# Patient Record
Sex: Male | Born: 1960
Health system: Southern US, Community
[De-identification: ages and names within clinical notes are randomized; demographics above are authoritative.]

## PROBLEM LIST (undated history)

## (undated) DIAGNOSIS — G8929 Other chronic pain: Secondary | ICD-10-CM

## (undated) DIAGNOSIS — I2510 Atherosclerotic heart disease of native coronary artery without angina pectoris: Secondary | ICD-10-CM

## (undated) DIAGNOSIS — R7303 Prediabetes: Secondary | ICD-10-CM

## (undated) DIAGNOSIS — I10 Essential (primary) hypertension: Secondary | ICD-10-CM

## (undated) DIAGNOSIS — I38 Endocarditis, valve unspecified: Secondary | ICD-10-CM

## (undated) DIAGNOSIS — R519 Headache, unspecified: Secondary | ICD-10-CM

## (undated) DIAGNOSIS — I509 Heart failure, unspecified: Secondary | ICD-10-CM

## (undated) DIAGNOSIS — M1990 Unspecified osteoarthritis, unspecified site: Secondary | ICD-10-CM

## (undated) HISTORY — DX: Other chronic pain: G89.29

---

## 1989-02-10 DIAGNOSIS — A159 Respiratory tuberculosis unspecified: Secondary | ICD-10-CM

## 1989-02-10 HISTORY — DX: Respiratory tuberculosis unspecified: A15.9

## 2017-09-07 ENCOUNTER — Other Ambulatory Visit: Payer: Self-pay

## 2017-09-07 ENCOUNTER — Emergency Department (HOSPITAL_COMMUNITY): Payer: No Typology Code available for payment source

## 2017-09-07 ENCOUNTER — Encounter (HOSPITAL_COMMUNITY): Payer: Self-pay

## 2017-09-07 ENCOUNTER — Emergency Department (HOSPITAL_COMMUNITY)
Admission: EM | Admit: 2017-09-07 | Discharge: 2017-09-07 | Disposition: A | Discharge status: Home / Self Care | Payer: No Typology Code available for payment source | Attending: Emergency Medicine | Admitting: Emergency Medicine

## 2017-09-07 DIAGNOSIS — S46911A Strain of unspecified muscle, fascia and tendon at shoulder and upper arm level, right arm, initial encounter: Secondary | ICD-10-CM | POA: Insufficient documentation

## 2017-09-07 DIAGNOSIS — Y999 Unspecified external cause status: Secondary | ICD-10-CM | POA: Insufficient documentation

## 2017-09-07 DIAGNOSIS — Y92410 Unspecified street and highway as the place of occurrence of the external cause: Secondary | ICD-10-CM | POA: Diagnosis not present

## 2017-09-07 DIAGNOSIS — R51 Headache: Secondary | ICD-10-CM | POA: Insufficient documentation

## 2017-09-07 DIAGNOSIS — Z79899 Other long term (current) drug therapy: Secondary | ICD-10-CM | POA: Insufficient documentation

## 2017-09-07 DIAGNOSIS — M5030 Other cervical disc degeneration, unspecified cervical region: Secondary | ICD-10-CM

## 2017-09-07 DIAGNOSIS — M5020 Other cervical disc displacement, unspecified cervical region: Secondary | ICD-10-CM | POA: Diagnosis not present

## 2017-09-07 DIAGNOSIS — Y9389 Activity, other specified: Secondary | ICD-10-CM | POA: Diagnosis not present

## 2017-09-07 DIAGNOSIS — S4991XA Unspecified injury of right shoulder and upper arm, initial encounter: Secondary | ICD-10-CM | POA: Diagnosis present

## 2017-09-07 DIAGNOSIS — I10 Essential (primary) hypertension: Secondary | ICD-10-CM | POA: Diagnosis not present

## 2017-09-07 DIAGNOSIS — F17210 Nicotine dependence, cigarettes, uncomplicated: Secondary | ICD-10-CM | POA: Insufficient documentation

## 2017-09-07 HISTORY — DX: Essential (primary) hypertension: I10

## 2017-09-07 LAB — I-STAT CHEM 8, ED
BUN: 11 mg/dL (ref 6–20)
CREATININE: 1 mg/dL (ref 0.61–1.24)
Calcium, Ion: 1.22 mmol/L (ref 1.15–1.40)
Chloride: 107 mmol/L (ref 98–111)
Glucose, Bld: 97 mg/dL (ref 70–99)
HEMATOCRIT: 36 % — AB (ref 39.0–52.0)
HEMOGLOBIN: 12.2 g/dL — AB (ref 13.0–17.0)
POTASSIUM: 4.3 mmol/L (ref 3.5–5.1)
SODIUM: 141 mmol/L (ref 135–145)
TCO2: 25 mmol/L (ref 22–32)

## 2017-09-07 MED ORDER — AMLODIPINE BESYLATE 10 MG PO TABS: 10.0000 mg | ORAL_TABLET | Freq: Every day | ORAL | 0 refills | Status: DC

## 2017-09-07 MED ORDER — HYDROCODONE-ACETAMINOPHEN 5-325 MG PO TABS
2.0000 | ORAL_TABLET | Freq: Once | ORAL | Status: AC
  Administered 2017-09-07: 2 via ORAL
  Filled 2017-09-07: qty 2

## 2017-09-07 MED ORDER — MORPHINE SULFATE (PF) 4 MG/ML IV SOLN
6.0000 mg | Freq: Once | INTRAVENOUS | Status: AC
  Administered 2017-09-07: 6 mg via INTRAVENOUS
  Filled 2017-09-07: qty 2

## 2017-09-07 MED ORDER — CLONIDINE HCL 0.1 MG PO TABS: 0.1000 mg | ORAL_TABLET | Freq: Two times a day (BID) | ORAL | 11 refills | Status: DC

## 2017-09-07 MED ORDER — HYDROCODONE-ACETAMINOPHEN 5-325 MG PO TABS: 2.0000 | ORAL_TABLET | ORAL | 0 refills | Status: DC | PRN

## 2017-09-07 MED ORDER — CLONIDINE HCL 0.2 MG PO TABS
0.2000 mg | ORAL_TABLET | Freq: Once | ORAL | Status: AC
  Administered 2017-09-07: 0.2 mg via ORAL
  Filled 2017-09-07: qty 1

## 2017-09-07 NOTE — Discharge Instructions (Signed)
It is very important for you to get your blood pressure well managed by a local doctor.  Start taking medications prescribed.  If you develop strokelike symptoms, weakness, numbness, loss of vision you need to be seen in the emergency room immediately.  For severe pain take norco or vicodin however realize they have the potential for addiction and it can make you sleepy and has tylenol in it.  No operating machinery while taking.

## 2017-09-07 NOTE — ED Provider Notes (Signed)
Patient was seen initially by Dr. Jodi Mourning.  Please see his notes.  Labs reviewed.     Labs Reviewed  I-STAT CHEM 8, ED - Abnormal; Notable for the following components:      Result Value   Hemoglobin 12.2 (*)    HCT 36.0 (*)    All other components within normal limits   No significant abnormalities.  BP is decreasing.  162/130.  Pt has poorly controlled htn.  Will dc home on bp meds as planned.  Discussed importance of PCP follow up and BP treatment with the pt   Linwood Dibbles, MD 09/07/17 1732
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
## 2017-09-08 ENCOUNTER — Telehealth: Payer: Self-pay | Admitting: Orthopedic Surgery

## 2017-09-08 NOTE — Telephone Encounter (Signed)
Patient called for an appointment for a "bulging disc".  States he was seen in the ED yesterday.  I told him that Dr. Romeo Apple was not the physician on call even though that was stated by the ED and also Dr Romeo Apple does not handle bulging disc problems.  I asked him if he had scheduled an appointment to see his PCP as indicated by the ED.  He said he has an appointment for tomorrow. I told him to then ask for referral to [facility] for the disc problem.  He said he would do this.

## 2017-10-19 ENCOUNTER — Telehealth: Payer: Self-pay

## 2017-10-19 NOTE — Telephone Encounter (Signed)
Spoke with patient about scheduling in concussion clinic. He would be self-pay and is going to look into if he can get the money for a visit before scheduling. Will call back to schedule.

## 2017-11-25 ENCOUNTER — Telehealth: Payer: Self-pay

## 2017-11-25 NOTE — Telephone Encounter (Signed)
Left message for patient to call back to discuss referral to neurology for ongoing symptoms.

## 2017-11-27 ENCOUNTER — Telehealth: Payer: Self-pay
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
## 2017-12-11 ENCOUNTER — Ambulatory Visit: Payer: Self-pay | Admitting: *Deleted
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
## 2017-12-14 ENCOUNTER — Telehealth: Payer: Self-pay
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
## 2018-10-07 ENCOUNTER — Telehealth: Payer: Self-pay

## 2018-10-07 NOTE — Telephone Encounter (Signed)
Faxed over [employer] application and financial docs.  Devon Silva

## 2018-10-12 ENCOUNTER — Ambulatory Visit: Payer: Self-pay | Admitting: Physician Assistant

## 2018-10-12 ENCOUNTER — Encounter: Payer: Self-pay | Admitting: Physician Assistant

## 2018-10-12 ENCOUNTER — Other Ambulatory Visit: Payer: Self-pay

## 2018-10-12 VITALS — BP 153/118 | HR 58 | Temp 97.9°F | Ht 70.0 in | Wt 222.0 lb

## 2018-10-12 DIAGNOSIS — I10 Essential (primary) hypertension: Secondary | ICD-10-CM

## 2018-10-12 DIAGNOSIS — F17200 Nicotine dependence, unspecified, uncomplicated: Secondary | ICD-10-CM

## 2018-10-12 DIAGNOSIS — Z131 Encounter for screening for diabetes mellitus: Secondary | ICD-10-CM

## 2018-10-12 DIAGNOSIS — Z125 Encounter for screening for malignant neoplasm of prostate: Secondary | ICD-10-CM

## 2018-10-12 DIAGNOSIS — Z13220 Encounter for screening for lipoid disorders: Secondary | ICD-10-CM

## 2018-10-12 DIAGNOSIS — Z1211 Encounter for screening for malignant neoplasm of colon: Secondary | ICD-10-CM

## 2018-10-12 DIAGNOSIS — F1290 Cannabis use, unspecified, uncomplicated: Secondary | ICD-10-CM

## 2018-10-12 DIAGNOSIS — Z7689 Persons encountering health services in other specified circumstances: Secondary | ICD-10-CM

## 2018-10-12 DIAGNOSIS — D649 Anemia, unspecified: Secondary | ICD-10-CM

## 2018-10-12 MED ORDER — AMLODIPINE BESYLATE 10 MG PO TABS: 10.0000 mg | ORAL_TABLET | Freq: Every day | ORAL | 1 refills | Status: DC

## 2018-10-12 NOTE — Progress Notes (Signed)
BP (!) 153/118   Pulse (!) 58   Temp 97.9 F (36.6 C)   Ht 5\' 10"  (1.778 m)   Wt 222 lb (100.7 kg)   SpO2 95%   BMI 31.85 kg/m    Subjective:    Patient ID: Devon Silva, male    DOB: 1960/05/11, 58 y.o.   MRN: [id number]  HPI: Devon Silva is a 58 y.o. male presenting on 10/12/2018 for No chief complaint on file.   HPI Pt had a negative covid 19 screening questionnaire    Pt appt today to establish care as new patient  Pt get pain meds from dr Layla Barter in [place]  He doesn't know the name of the medication  He says he was given lasix so he could urinate   Pt does not work.    He says he has no history of heart problems.  No history of ankle swelling. 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  He says his last Echo about 2 years ago.  He says he is not having any CP or SOB.    Relevant past medical, surgical, family and social history reviewed and updated as indicated. Interim medical history since our last visit reviewed. Allergies and medications reviewed and updated.  CURRENT MEDS:  Hydralazine 25mg  bid lasix 40mg  qd Unknown pain medication  Review of Systems  Per HPI unless specifically indicated above     Objective:    BP (!) 153/118   Pulse (!) 58   Temp 97.9 F (36.6 C)   Ht 5\' 10"  (1.778 m)   Wt 222 lb (100.7 kg)   SpO2 95%   BMI 31.85 kg/m   Wt Readings from Last 3 Encounters:  10/12/18 222 lb (100.7 kg)  09/07/17 197 lb (89.4 kg)    Physical Exam Vitals signs reviewed.  Constitutional:      General: He is not in acute distress.    Appearance: He is well-developed. He is obese. He is not ill-appearing.  HENT:     Head: Normocephalic and atraumatic.     Mouth/Throat:     Pharynx: No oropharyngeal exudate.  Eyes:     Conjunctiva/sclera: Conjunctivae normal.     Pupils: Pupils are equal, round, and reactive to light.  Neck:     Musculoskeletal: Neck supple.     Thyroid: No thyromegaly.  Cardiovascular:     Rate and  Rhythm: Normal rate and regular rhythm.     Comments: Some irregular beats heard Pulmonary:     Effort: Pulmonary effort is normal.     Breath sounds: Normal breath sounds. No wheezing or rales.  Abdominal:     General: Bowel sounds are normal.     Palpations: Abdomen is soft. There is no mass.     Tenderness: There is no abdominal tenderness.  Musculoskeletal:     Right lower leg: No edema.     Left lower leg: No edema.  Lymphadenopathy:     Cervical: No cervical adenopathy.  Skin:    General: Skin is warm and dry.     Findings: No rash.  Neurological:     Mental Status: He is alert and oriented to person, place, and time.  Psychiatric:        Behavior: Behavior normal.        Thought Content: Thought content normal.    EKG- NSR at 92bpm.  Premature beats prsent.  No acute st-t changes seen.  No previous for comparison  Assessment & Plan:    Encounter Diagnoses  Name Primary?  . Encounter to establish care Yes  . Essential hypertension   . Screening cholesterol level   . Screening for diabetes mellitus   . Screening for prostate cancer   . Screening for colon cancer   . Anemia, unspecified type   . Tobacco use disorder   . Marijuana use      -will discontinue current diuretics.   rx amlodipine for uncontrolled HTN.  -pt given ifobt for colon cancer screening  -Pt to check bp a t home.  He is to notify office if bp is higher at home  -pt to Get baseline Labs  -pt to follow up 1 month.  He is to contact office sooner prn

## 2018-10-13 ENCOUNTER — Other Ambulatory Visit (HOSPITAL_COMMUNITY)
Admission: RE | Admit: 2018-10-13 | Discharge: 2018-10-13 | Disposition: A | Discharge status: Home / Self Care | Payer: Self-pay | Source: Ambulatory Visit | Attending: Physician Assistant | Admitting: Physician Assistant

## 2018-10-13 DIAGNOSIS — I10 Essential (primary) hypertension: Secondary | ICD-10-CM | POA: Insufficient documentation

## 2018-10-13 DIAGNOSIS — D649 Anemia, unspecified: Secondary | ICD-10-CM | POA: Insufficient documentation

## 2018-10-13 DIAGNOSIS — Z13220 Encounter for screening for lipoid disorders: Secondary | ICD-10-CM | POA: Insufficient documentation

## 2018-10-13 DIAGNOSIS — Z125 Encounter for screening for malignant neoplasm of prostate: Secondary | ICD-10-CM | POA: Insufficient documentation

## 2018-10-13 DIAGNOSIS — Z131 Encounter for screening for diabetes mellitus: Secondary | ICD-10-CM | POA: Insufficient documentation

## 2018-10-13 LAB — COMPREHENSIVE METABOLIC PANEL
ALT: 25 U/L (ref 0–44)
AST: 23 U/L (ref 15–41)
Albumin: 3.9 g/dL (ref 3.5–5.0)
Alkaline Phosphatase: 77 U/L (ref 38–126)
Anion gap: 6 (ref 5–15)
BUN: 20 mg/dL (ref 6–20)
CO2: 24 mmol/L (ref 22–32)
Calcium: 9.2 mg/dL (ref 8.9–10.3)
Chloride: 108 mmol/L (ref 98–111)
Creatinine, Ser: 1.25 mg/dL — ABNORMAL HIGH (ref 0.61–1.24)
GFR calc Af Amer: >60 mL/min (ref >60)
GFR calc non Af Amer: >60 mL/min (ref >60)
Glucose, Bld: 163 mg/dL — ABNORMAL HIGH (ref 70–99)
Potassium: 4.4 mmol/L (ref 3.5–5.1)
Sodium: 138 mmol/L (ref 135–145)
Total Bilirubin: 1.3 mg/dL — ABNORMAL HIGH (ref 0.3–1.2)
Total Protein: 7.7 g/dL (ref 6.5–8.1)

## 2018-10-13 LAB — CBC
HCT: 41 % (ref 39.0–52.0)
Hemoglobin: 13.7 g/dL (ref 13.0–17.0)
MCH: 25.4 pg — ABNORMAL LOW (ref 26.0–34.0)
MCHC: 33.4 g/dL (ref 30.0–36.0)
MCV: 76.1 fL — ABNORMAL LOW (ref 80.0–100.0)
Platelets: 161 10*3/uL (ref 150–400)
RBC: 5.39 MIL/uL (ref 4.22–5.81)
RDW: 17.2 % — ABNORMAL HIGH (ref 11.5–15.5)
WBC: 10.9 10*3/uL — ABNORMAL HIGH (ref 4.0–10.5)
nRBC: 0.2 % (ref 0.0–0.2)

## 2018-10-13 LAB — HEMOGLOBIN A1C
Hgb A1c MFr Bld: 6.3 % — ABNORMAL HIGH (ref 4.8–5.6)
Mean Plasma Glucose: 134.11 mg/dL

## 2018-10-13 LAB — PSA: Prostatic Specific Antigen: 1.28 ng/mL (ref 0.00–4.00)

## 2018-10-13 LAB — LIPID PANEL
Cholesterol: 175 mg/dL (ref 0–200)
HDL: 28 mg/dL — ABNORMAL LOW (ref >40)
LDL Cholesterol: 123 mg/dL — ABNORMAL HIGH (ref 0–99)
Total CHOL/HDL Ratio: 6.3 RATIO
Triglycerides: 122 mg/dL (ref <150)
VLDL: 24 mg/dL (ref 0–40)

## 2018-10-14 ENCOUNTER — Telehealth: Payer: Self-pay | Admitting: Physician Assistant

## 2018-11-11 ENCOUNTER — Encounter: Payer: Self-pay | Admitting: Physician Assistant

## 2018-11-11 ENCOUNTER — Ambulatory Visit: Payer: Self-pay | Admitting: Physician Assistant

## 2018-11-11 VITALS — BP 140/68

## 2018-11-11 DIAGNOSIS — I10 Essential (primary) hypertension: Secondary | ICD-10-CM

## 2018-11-11 DIAGNOSIS — E785 Hyperlipidemia, unspecified: Secondary | ICD-10-CM

## 2018-11-11 DIAGNOSIS — R7303 Prediabetes: Secondary | ICD-10-CM

## 2018-11-11 DIAGNOSIS — F17200 Nicotine dependence, unspecified, uncomplicated: Secondary | ICD-10-CM

## 2018-11-11 DIAGNOSIS — R7989 Other specified abnormal findings of blood chemistry: Secondary | ICD-10-CM

## 2018-11-11 MED ORDER — AMLODIPINE BESYLATE 10 MG PO TABS: 10.0000 mg | ORAL_TABLET | Freq: Every day | ORAL | 1 refills | Status: DC

## 2018-11-11 MED ORDER — ATORVASTATIN CALCIUM 20 MG PO TABS: 20.0000 mg | ORAL_TABLET | Freq: Every day | ORAL | 1 refills | Status: DC

## 2018-11-11 NOTE — Progress Notes (Signed)
BP 140/68    Subjective:    Patient ID: Devon Silva, male    DOB: 1960-09-01, 58 y.o.   MRN: [id number]  HPI: Devon Silva is a 58 y.o. male presenting on 11/11/2018 for Hypertension (pt last checked his BP at home and his BP was 180/85 "something like that)   HPI   This is a telemedicine appointment through [employer] due to coronavirus pandemic  I connected with  Devon Silva on 11/11/18 by a video enabled telemedicine application and verified that I am speaking with the correct person using two identifiers.   I discussed the limitations of evaluation and management by telemedicine. The patient expressed understanding and agreed to proceed.  Pt is at home.  Provider is at office.     Pt has bp machine at home.   He says he is doing well and feels good today.  He has no complaints.    Relevant past medical, surgical, family and social history reviewed and updated as indicated. Interim medical history since our last visit reviewed. Allergies and medications reviewed and updated.   Current Outpatient Medications:  .  amLODipine (NORVASC) 10 MG tablet, Take 1 tablet (10 mg total) by mouth daily., Disp: 30 tablet, Rfl: 1     Review of Systems  Per HPI unless specifically indicated above     Objective:    BP 140/68   Wt Readings from Last 3 Encounters:  10/12/18 222 lb (100.7 kg)  09/07/17 197 lb (89.4 kg)    Physical Exam Constitutional:      General: He is not in acute distress.    Appearance: Normal appearance. He is not ill-appearing.  HENT:     Head: Normocephalic and atraumatic.  Pulmonary:     Effort: Pulmonary effort is normal. No respiratory distress.  Neurological:     Mental Status: He is alert and oriented to person, place, and time.  Psychiatric:        Attention and Perception: Attention normal.        Speech: Speech normal.        Behavior: Behavior is cooperative.     Results for orders placed or performed during the hospital  encounter of 10/13/18  CBC  Result Value Ref Range   WBC 10.9 (H) 4.0 - 10.5 K/uL   RBC 5.39 4.22 - 5.81 MIL/uL   Hemoglobin 13.7 13.0 - 17.0 g/dL   HCT 41.0 39.0 - 52.0 %   MCV 76.1 (L) 80.0 - 100.0 fL   MCH 25.4 (L) 26.0 - 34.0 pg   MCHC 33.4 30.0 - 36.0 g/dL   RDW 17.2 (H) 11.5 - 15.5 %   Platelets 161 150 - 400 K/uL   nRBC 0.2 0.0 - 0.2 %  Hemoglobin A1c  Result Value Ref Range   Hgb A1c MFr Bld 6.3 (H) 4.8 - 5.6 %   Mean Plasma Glucose 134.11 mg/dL  PSA  Result Value Ref Range   Prostatic Specific Antigen 1.28 0.00 - 4.00 ng/mL  Lipid panel  Result Value Ref Range   Cholesterol 175 0 - 200 mg/dL   Triglycerides 122 <150 mg/dL   HDL 28 (L) >40 mg/dL   Total CHOL/HDL Ratio 6.3 RATIO   VLDL 24 0 - 40 mg/dL   LDL Cholesterol 123 (H) 0 - 99 mg/dL  Comprehensive metabolic panel  Result Value Ref Range   Sodium 138 135 - 145 mmol/L   Potassium 4.4 3.5 - 5.1 mmol/L   Chloride 108 98 - 111  mmol/L   CO2 24 22 - 32 mmol/L   Glucose, Bld 163 (H) 70 - 99 mg/dL   BUN 20 6 - 20 mg/dL   Creatinine, Ser 1.61 (H) 0.61 - 1.24 mg/dL   Calcium 9.2 8.9 - 09.6 mg/dL   Total Protein 7.7 6.5 - 8.1 g/dL   Albumin 3.9 3.5 - 5.0 g/dL   AST 23 15 - 41 U/L   ALT 25 0 - 44 U/L   Alkaline Phosphatase 77 38 - 126 U/L   Total Bilirubin 1.3 (H) 0.3 - 1.2 mg/dL   GFR calc non Af Amer >60 >60 mL/min   GFR calc Af Amer >60 >60 mL/min   Anion gap 6 5 - 15      Assessment & Plan:    Encounter Diagnoses  Name Primary?  . Essential hypertension Yes  . Tobacco use disorder   . Prediabetes   . Hyperlipidemia, unspecified hyperlipidemia type   . Elevated serum creatinine      -reviewed labs with pt -Counseled smoking cessation -will Add statin for lipids.   -pt to Continue amlodipine -Counseled pt on lowfat diet and prediabetes.  Will Mail cholesterol  and prediabetes information  -discussed with pt that Cr likely to improved now that he is off the diuretics that he was taking when he  initially came to clinic.  It will be monitored -pt was reminded to return iFOBT that was given to him at initial OV (for colon cancer screening) -Pt to follow up 3 months.  He is to contact office sooner prn

## 2018-11-11 NOTE — Patient Instructions (Signed)
High Cholesterol  High cholesterol is a condition in which the blood has high levels of a white, waxy, fat-like substance (cholesterol). The human body needs small amounts of cholesterol. The liver makes all the cholesterol that the body needs. Extra (excess) cholesterol comes from the food that we eat. Cholesterol is carried from the liver by the blood through the blood vessels. If you have high cholesterol, deposits (plaques) may build up on the walls of your blood vessels (arteries). Plaques make the arteries narrower and stiffer. Cholesterol plaques increase your risk for heart attack and stroke. Work with your health care provider to keep your cholesterol levels in a healthy range. What increases the risk? This condition is more likely to develop in people who:  Eat foods that are high in animal fat (saturated fat) or cholesterol.  Are overweight.  Are not getting enough exercise.  Have a family history of high cholesterol. What are the signs or symptoms? There are no symptoms of this condition. How is this diagnosed? This condition may be diagnosed from the results of a blood test.  If you are older than age 20, your health care provider may check your cholesterol every 4-6 years.  You may be checked more often if you already have high cholesterol or other risk factors for heart disease. The blood test for cholesterol measures:  "Bad" cholesterol (LDL cholesterol). This is the main type of cholesterol that causes heart disease. The desired level for LDL is less than 100.  "Good" cholesterol (HDL cholesterol). This type helps to protect against heart disease by cleaning the arteries and carrying the LDL away. The desired level for HDL is 60 or higher.  Triglycerides. These are fats that the body can store or burn for energy. The desired number for triglycerides is lower than 150.  Total cholesterol. This is a measure of the total amount of cholesterol in your blood, including LDL  cholesterol, HDL cholesterol, and triglycerides. A healthy number is less than 200. How is this treated? This condition is treated with diet changes, lifestyle changes, and medicines. Diet changes  This may include eating more whole grains, fruits, vegetables, nuts, and fish.  This may also include cutting back on red meat and foods that have a lot of added sugar. Lifestyle changes  Changes may include getting at least 40 minutes of aerobic exercise 3 times a week. Aerobic exercises include walking, biking, and swimming. Aerobic exercise along with a healthy diet can help you maintain a healthy weight.  Changes may also include quitting smoking. Medicines  Medicines are usually given if diet and lifestyle changes have failed to reduce your cholesterol to healthy levels.  Your health care provider may prescribe a statin medicine. Statin medicines have been shown to reduce cholesterol, which can reduce the risk of heart disease. Follow these instructions at home: Eating and drinking If told by your health care provider:  Eat chicken (without skin), fish, veal, shellfish, ground turkey breast, and round or loin cuts of red meat.  Do not eat fried foods or fatty meats, such as hot dogs and salami.  Eat plenty of fruits, such as apples.  Eat plenty of vegetables, such as broccoli, potatoes, and carrots.  Eat beans, peas, and lentils.  Eat grains such as barley, rice, couscous, and bulgur wheat.  Eat pasta without cream sauces.  Use skim or nonfat milk, and eat low-fat or nonfat yogurt and cheeses.  Do not eat or drink whole milk, cream, ice cream, egg yolks,   or hard cheeses.  Do not eat stick margarine or tub margarines that contain trans fats (also called partially hydrogenated oils).  Do not eat saturated tropical oils, such as coconut oil and palm oil.  Do not eat cakes, cookies, crackers, or other baked goods that contain trans fats.  General instructions  Exercise as  directed by your health care provider. Increase your activity level with activities such as gardening, walking, and taking the stairs.  Take over-the-counter and prescription medicines only as told by your health care provider.  Do not use any products that contain nicotine or tobacco, such as cigarettes and e-cigarettes. If you need help quitting, ask your health care provider.  Keep all follow-up visits as told by your health care provider. This is important. Contact a health care provider if:  You are struggling to maintain a healthy diet or weight.  You need help to start on an exercise program.  You need help to stop smoking. Get help right away if:  You have chest pain.  You have trouble breathing. This information is not intended to replace advice given to you by your health care provider. Make sure you discuss any questions you have with your health care provider. Document Released: 01/27/2005 Document Revised: 01/30/2017 Document Reviewed: 07/28/2015 Elsevier Patient Education  2020 [employer].   -----------------------   Prediabetes Prediabetes is the condition of having a blood sugar (blood glucose) level that is higher than it should be, but not high enough for you to be diagnosed with type 2 diabetes. Having prediabetes puts you at risk for developing type 2 diabetes (type 2 diabetes mellitus). Prediabetes may be called impaired glucose tolerance or impaired fasting glucose. Prediabetes usually does not cause symptoms. Your health care provider can diagnose this condition with blood tests. You may be tested for prediabetes if you are overweight and if you have at least one other risk factor for prediabetes. What is blood glucose, and how is it measured? Blood glucose refers to the amount of glucose in your bloodstream. Glucose comes from eating foods that contain sugars and starches (carbohydrates), which the body breaks down into glucose. Your blood glucose level may  be measured in mg/dL (milligrams per deciliter) or mmol/L (millimoles per liter). Your blood glucose may be checked with one or more of the following blood tests:  A fasting blood glucose (FBG) test. You will not be allowed to eat (you will fast) for 8 hours or longer before a blood sample is taken. ? A normal range for FBG is 70-100 mg/dl (9.3-5.7 mmol/L).  An A1c (hemoglobin A1c) blood test. This test provides information about blood glucose control over the previous 2?51months.  An oral glucose tolerance test (OGTT). This test measures your blood glucose at two times: ? After fasting. This is your baseline level. ? Two hours after you drink a beverage that contains glucose. You may be diagnosed with prediabetes:  If your FBG is 100?125 mg/dL (0.1-7.7 mmol/L).  If your A1c level is 5.7?6.4%.  If your OGTT result is 140?199 mg/dL (9.3-90 mmol/L). These blood tests may be repeated to confirm your diagnosis. How can this condition affect me? The pancreas produces a hormone (insulin) that helps to move glucose from the bloodstream into cells. When cells in the body do not respond properly to insulin that the body makes (insulin resistance), excess glucose builds up in the blood instead of going into cells. As a result, high blood glucose (hyperglycemia) can develop, which can cause many complications.  is a symptom of prediabetes. Having high blood glucose for a long time is dangerous. Too much glucose in your blood can damage your nerves and blood vessels. Long-term damage can lead to complications from diabetes, which may include:  Heart disease.  Stroke.  Blindness.  Kidney disease.  Depression.  Poor circulation in the feet and legs, which could lead to surgical removal (amputation) in severe cases. What can increase my risk? Risk factors for prediabetes include:  Having a family member with type 2 diabetes.  Being overweight or obese.  Being  older than age 45.  Being of American Indian, African-American, Hispanic/Latino, or Asian/Pacific Islander descent.  Having an inactive (sedentary) lifestyle.  Having a history of heart disease.  History of gestational diabetes or polycystic ovary syndrome (PCOS), in women.  Having low levels of good cholesterol (HDL-C) or high levels of blood fats (triglycerides).  Having high blood pressure. What actions can I take to prevent diabetes?      Be physically active. ? Do moderate-intensity physical activity for 30 or more minutes on 5 or more days of the week, or as much as told by your health care provider. This could be brisk walking, biking, or water aerobics. ? Ask your health care provider what activities are safe for you. A mix of physical activities may be best, such as walking, swimming, cycling, and strength training.  Lose weight as told by your health care provider. ? Losing 5-7% of your body weight can reverse insulin resistance. ? Your health care provider can determine how much weight loss is best for you and can help you lose weight safely.  Follow a healthy meal plan. This includes eating lean proteins, complex carbohydrates, fresh fruits and vegetables, low-fat dairy products, and healthy fats. ? Follow instructions from your health care provider about eating or drinking restrictions. ? Make an appointment to see a diet and nutrition specialist (registered dietitian) to help you create a healthy eating plan that is right for you.  Do not smoke or use any tobacco products, such as cigarettes, chewing tobacco, and e-cigarettes. If you need help quitting, ask your health care provider.  Take over-the-counter and prescription medicines as told by your health care provider. You may be prescribed medicines that help lower the risk of type 2 diabetes.  Keep all follow-up visits as told by your health care provider. This is important. Summary  Prediabetes is the condition  of having a blood sugar (blood glucose) level that is higher than it should be, but not high enough for you to be diagnosed with type 2 diabetes.  Having prediabetes puts you at risk for developing type 2 diabetes (type 2 diabetes mellitus).  To help prevent type 2 diabetes, make lifestyle changes such as being physically active and eating a healthy diet. Lose weight as told by your health care provider. This information is not intended to replace advice given to you by your health care provider. Make sure you discuss any questions you have with your health care provider. Document Released: 05/21/2015 Document Revised: 05/21/2018 Document Reviewed: 03/20/2015 Elsevier Patient Education  2020 Else[employer] Inc[employer]  

## 2018-12-20 ENCOUNTER — Encounter: Payer: Self-pay | Admitting: Physician Assistant

## 2019-02-21 ENCOUNTER — Ambulatory Visit: Payer: Self-pay | Admitting: Physician Assistant

## 2019-02-21 DIAGNOSIS — I10 Essential (primary) hypertension: Secondary | ICD-10-CM

## 2019-02-21 DIAGNOSIS — E785 Hyperlipidemia, unspecified: Secondary | ICD-10-CM

## 2019-02-21 DIAGNOSIS — Z91199 Patient's noncompliance with other medical treatment and regimen due to unspecified reason: Secondary | ICD-10-CM

## 2019-02-21 DIAGNOSIS — F17200 Nicotine dependence, unspecified, uncomplicated: Secondary | ICD-10-CM

## 2019-02-21 DIAGNOSIS — Z9119 Patient's noncompliance with other medical treatment and regimen: Secondary | ICD-10-CM

## 2019-02-21 NOTE — Progress Notes (Signed)
   There were no vitals taken for this visit.   Subjective:    Patient ID: Devon Silva, male    DOB: 1960-09-03, 59 y.o.   MRN: [id number]  HPI: Devon Silva is a 59 y.o. male presenting on 02/21/2019 for No chief complaint on file.   HPI    This is a telemedicine appointment due to coronavirus pandemic.  It is via Telephone due to patient does not have a video enabled device.    I connected with  Devon Silva on 02/21/19 by a video enabled telemedicine application and verified that I am speaking with the correct person using two identifiers.   I discussed the limitations of evaluation and management by telemedicine. The patient expressed understanding and agreed to proceed.  Pt is at home.  Provider is at office    Pt is 58yoM with HTN and dyslipidemia.  He is at home today.    He says he took his bp yesterday but he doesn't remember the reading.  He says he Took his bp machine to his mother in laws so he can't check it right now.  Pt Didn't get his labs drawn as instructed.    Pt has no complaints today.     Relevant past medical, surgical, family and social history reviewed and updated as indicated. Interim medical history since our last visit reviewed. Allergies and medications reviewed and updated.  Review of Systems  Per HPI unless specifically indicated above     Objective:    There were no vitals taken for this visit.  Wt Readings from Last 3 Encounters:  10/12/18 222 lb (100.7 kg)  09/07/17 197 lb (89.4 kg)    Physical Exam Pulmonary:     Effort: No respiratory distress.  Neurological:     Mental Status: He is alert and oriented to person, place, and time.  Psychiatric:        Attention and Perception: Attention normal.        Speech: Speech normal.        Behavior: Behavior is cooperative.           Assessment & Plan:    Encounter Diagnoses  Name Primary?  . Essential hypertension Yes  . Hyperlipidemia, unspecified  hyperlipidemia type   . Tobacco use disorder   . Personal history of noncompliance with medical treatment, presenting hazards to health       -pt to Get labs drawn and have his bp machine available at next appointment -F/u scheduled for next Monday

## 2019-02-24 ENCOUNTER — Other Ambulatory Visit (HOSPITAL_COMMUNITY)
Admission: RE | Admit: 2019-02-24 | Discharge: 2019-02-24 | Disposition: A | Discharge status: Home / Self Care | Payer: Self-pay | Source: Ambulatory Visit | Attending: Physician Assistant | Admitting: Physician Assistant

## 2019-02-24 ENCOUNTER — Other Ambulatory Visit: Payer: Self-pay

## 2019-02-24 DIAGNOSIS — I10 Essential (primary) hypertension: Secondary | ICD-10-CM | POA: Insufficient documentation

## 2019-02-24 DIAGNOSIS — R7303 Prediabetes: Secondary | ICD-10-CM | POA: Insufficient documentation

## 2019-02-24 DIAGNOSIS — E785 Hyperlipidemia, unspecified: Secondary | ICD-10-CM | POA: Insufficient documentation

## 2019-02-24 DIAGNOSIS — R7989 Other specified abnormal findings of blood chemistry: Secondary | ICD-10-CM | POA: Insufficient documentation

## 2019-02-24 LAB — COMPREHENSIVE METABOLIC PANEL
ALT: 24 U/L (ref 0–44)
AST: 20 U/L (ref 15–41)
Albumin: 4.1 g/dL (ref 3.5–5.0)
Alkaline Phosphatase: 96 U/L (ref 38–126)
Anion gap: 8 (ref 5–15)
BUN: 16 mg/dL (ref 6–20)
CO2: 23 mmol/L (ref 22–32)
Calcium: 9.4 mg/dL (ref 8.9–10.3)
Chloride: 108 mmol/L (ref 98–111)
Creatinine, Ser: 1.06 mg/dL (ref 0.61–1.24)
GFR calc Af Amer: >60 mL/min (ref >60)
GFR calc non Af Amer: >60 mL/min (ref >60)
Glucose, Bld: 105 mg/dL — ABNORMAL HIGH (ref 70–99)
Potassium: 4.1 mmol/L (ref 3.5–5.1)
Sodium: 139 mmol/L (ref 135–145)
Total Bilirubin: 1.2 mg/dL (ref 0.3–1.2)
Total Protein: 8.1 g/dL (ref 6.5–8.1)

## 2019-02-24 LAB — LIPID PANEL
Cholesterol: 144 mg/dL (ref 0–200)
HDL: 27 mg/dL — ABNORMAL LOW (ref >40)
LDL Cholesterol: 106 mg/dL — ABNORMAL HIGH (ref 0–99)
Total CHOL/HDL Ratio: 5.3 RATIO
Triglycerides: 57 mg/dL (ref <150)
VLDL: 11 mg/dL (ref 0–40)

## 2019-02-28 ENCOUNTER — Encounter: Payer: Self-pay | Admitting: Physician Assistant

## 2019-02-28 ENCOUNTER — Ambulatory Visit: Payer: Self-pay | Admitting: Physician Assistant

## 2019-02-28 VITALS — BP 150/112 | HR 89

## 2019-02-28 DIAGNOSIS — I10 Essential (primary) hypertension: Secondary | ICD-10-CM

## 2019-02-28 DIAGNOSIS — E785 Hyperlipidemia, unspecified: Secondary | ICD-10-CM

## 2019-02-28 DIAGNOSIS — F17200 Nicotine dependence, unspecified, uncomplicated: Secondary | ICD-10-CM

## 2019-02-28 DIAGNOSIS — R7303 Prediabetes: Secondary | ICD-10-CM

## 2019-02-28 MED ORDER — HYDROCHLOROTHIAZIDE 12.5 MG PO CAPS: 12.5000 mg | ORAL_CAPSULE | Freq: Every day | ORAL | 1 refills | Status: DC

## 2019-02-28 NOTE — Progress Notes (Signed)
BP (!) 150/112   Pulse 89    Subjective:    Patient ID: Devon Silva, male    DOB: [birth year], 59 y.o.   MRN: [id number]  HPI: Bridget Westbrooks is a 59 y.o. male presenting on 02/28/2019 for No chief complaint on file.   HPI   This is a telemedicine appointment due to coronavirus pandemic.  It is via Telephone as pt does not have a video enabled device.     I connected with  Devon Silva on 02/28/19  by a video enabled telemedicine application and verified that I am speaking with the correct person using two identifiers.   I discussed the limitations of evaluation and management by telemedicine. The patient expressed understanding and agreed to proceed.  Pt is at home.  Provider is at office.      Pt is 58yoM with HTN, prediabetes, dyslipidemia, tobacco use disorder and impaired renal function.   He is currently Not working.  Pt had appointment last week which was undproductive as pt had not gotten his labs drawn and he had taken his bp machine to someone else's home.  He  Checks his bp at home.  He doesn't have any idea what it was.  He just knows that his wife tells him it's good.    Someone comes into his home at some point during appointment and tells him what his reading was.    Relevant past medical, surgical, family and social history reviewed and updated as indicated. Interim medical history since our last visit reviewed. Allergies and medications reviewed and updated.   Current Outpatient Medications:  .  amLODipine (NORVASC) 10 MG tablet, Take 1 tablet (10 mg total) by mouth daily., Disp: 90 tablet, Rfl: 1 .  atorvastatin (LIPITOR) 20 MG tablet, Take 1 tablet (20 mg total) by mouth daily., Disp: 90 tablet, Rfl: 1    Review of Systems  Per HPI unless specifically indicated above     Objective:    BP (!) 150/112   Pulse 89   Wt Readings from Last 3 Encounters:  10/12/18 222 lb (100.7 kg)  09/07/17 197 lb (89.4 kg)    Physical Exam Pulmonary:   Effort: Pulmonary effort is normal. No respiratory distress.  Neurological:     Mental Status: He is alert and oriented to person, place, and time.  Psychiatric:        Attention and Perception: Attention normal.        Speech: Speech normal.        Behavior: Behavior is cooperative.     Results for orders placed or performed during the hospital encounter of 02/24/19  Lipid panel  Result Value Ref Range   Cholesterol 144 0 - 200 mg/dL   Triglycerides 57 <409 mg/dL   HDL 27 (L) >81 mg/dL   Total CHOL/HDL Ratio 5.3 RATIO   VLDL 11 0 - 40 mg/dL   LDL Cholesterol 191 (H) 0 - 99 mg/dL  Comprehensive metabolic panel  Result Value Ref Range   Sodium 139 135 - 145 mmol/L   Potassium 4.1 3.5 - 5.1 mmol/L   Chloride 108 98 - 111 mmol/L   CO2 23 22 - 32 mmol/L   Glucose, Bld 105 (H) 70 - 99 mg/dL   BUN 16 6 - 20 mg/dL   Creatinine, Ser 4.78 0.61 - 1.24 mg/dL   Calcium 9.4 8.9 - 29.5 mg/dL   Total Protein 8.1 6.5 - 8.1 g/dL   Albumin 4.1 3.5 - 5.0 g/dL  AST 20 15 - 41 U/L   ALT 24 0 - 44 U/L   Alkaline Phosphatase 96 38 - 126 U/L   Total Bilirubin 1.2 0.3 - 1.2 mg/dL   GFR calc non Af Amer >60 >60 mL/min   GFR calc Af Amer >60 >60 mL/min   Anion gap 8 5 - 15      Assessment & Plan:     Encounter Diagnoses  Name Primary?  . Essential hypertension Yes  . Hyperlipidemia, unspecified hyperlipidemia type   . Tobacco use disorder   . Prediabetes      -Reviewed labs- lipids and Cr improved -Add hctz 12.5 for bp which is not to goal -pt to Continue amlodipine and atorvastain -encouraged smoking cessation -pt to F/u in office 1 month to recheck bp.  Pt to contact office sooner prn

## 2019-03-05 DIAGNOSIS — I10 Essential (primary) hypertension: Secondary | ICD-10-CM | POA: Insufficient documentation

## 2019-03-05 DIAGNOSIS — F17200 Nicotine dependence, unspecified, uncomplicated: Secondary | ICD-10-CM | POA: Insufficient documentation

## 2019-03-05 DIAGNOSIS — E785 Hyperlipidemia, unspecified: Secondary | ICD-10-CM | POA: Insufficient documentation

## 2019-03-05 DIAGNOSIS — R7303 Prediabetes: Secondary | ICD-10-CM | POA: Insufficient documentation

## 2019-03-29 ENCOUNTER — Ambulatory Visit: Payer: Self-pay | Admitting: Physician Assistant

## 2019-03-29 ENCOUNTER — Other Ambulatory Visit: Payer: Self-pay

## 2019-03-29 ENCOUNTER — Encounter: Payer: Self-pay | Admitting: Physician Assistant

## 2019-03-29 VITALS — BP 98/72 | HR 90 | Temp 97.5°F | Wt 223.0 lb

## 2019-03-29 DIAGNOSIS — I10 Essential (primary) hypertension: Secondary | ICD-10-CM

## 2019-03-29 DIAGNOSIS — F17200 Nicotine dependence, unspecified, uncomplicated: Secondary | ICD-10-CM

## 2019-03-29 DIAGNOSIS — E669 Obesity, unspecified: Secondary | ICD-10-CM

## 2019-03-29 DIAGNOSIS — E785 Hyperlipidemia, unspecified: Secondary | ICD-10-CM

## 2019-03-29 NOTE — Progress Notes (Signed)
BP 98/72   Pulse 90   Temp (!) 97.5 F (36.4 C)   Wt 223 lb (101.2 kg)   SpO2 91%   BMI 32.00 kg/m    Subjective:    Patient ID: Devon Silva, male    DOB: 01-26-1961, 59 y.o.   MRN: [id number]  HPI: Gor Vestal is a 59 y.o. male presenting on 03/29/2019 for Hypertension   HPI    Pt had a negative covid 19 screening questionnaire   Pt is 58yoM with HTN and dyslipidemia.   Pt given ifobt for colon cancer screening in September that he hasn't returned. Pt has no complaints today.    Relevant past medical, surgical, family and social history reviewed and updated as indicated. Interim medical history since our last visit reviewed. Allergies and medications reviewed and updated.   Current Outpatient Medications:  .  amLODipine (NORVASC) 10 MG tablet, Take 1 tablet (10 mg total) by mouth daily., Disp: 90 tablet, Rfl: 1 .  atorvastatin (LIPITOR) 20 MG tablet, Take 1 tablet (20 mg total) by mouth daily., Disp: 90 tablet, Rfl: 1 .  hydrochlorothiazide (MICROZIDE) 12.5 MG capsule, Take 1 capsule (12.5 mg total) by mouth daily., Disp: 30 capsule, Rfl: 1    Review of Systems  Per HPI unless specifically indicated above     Objective:    BP 98/72   Pulse 90   Temp (!) 97.5 F (36.4 C)   Wt 223 lb (101.2 kg)   SpO2 91%   BMI 32.00 kg/m   Wt Readings from Last 3 Encounters:  03/29/19 223 lb (101.2 kg)  10/12/18 222 lb (100.7 kg)  09/07/17 197 lb (89.4 kg)    Physical Exam Vitals reviewed.  Constitutional:      General: He is not in acute distress.    Appearance: He is well-developed. He is obese. He is not ill-appearing.  HENT:     Head: Normocephalic and atraumatic.  Cardiovascular:     Rate and Rhythm: Normal rate and regular rhythm.  Pulmonary:     Effort: Pulmonary effort is normal.     Breath sounds: Normal breath sounds. No wheezing.  Abdominal:     General: Bowel sounds are normal.     Palpations: Abdomen is soft.     Tenderness: There is no  abdominal tenderness.  Musculoskeletal:     Cervical back: Neck supple.     Right lower leg: No edema.     Left lower leg: No edema.  Lymphadenopathy:     Cervical: No cervical adenopathy.  Skin:    General: Skin is warm and dry.  Neurological:     Mental Status: He is alert and oriented to person, place, and time.  Psychiatric:        Attention and Perception: Attention normal.        Speech: Speech normal.        Behavior: Behavior normal. Behavior is cooperative.     Results for orders placed or performed during the hospital encounter of 02/24/19  Lipid panel  Result Value Ref Range   Cholesterol 144 0 - 200 mg/dL   Triglycerides 57 <106 mg/dL   HDL 27 (L) >26 mg/dL   Total CHOL/HDL Ratio 5.3 RATIO   VLDL 11 0 - 40 mg/dL   LDL Cholesterol 948 (H) 0 - 99 mg/dL  Comprehensive metabolic panel  Result Value Ref Range   Sodium 139 135 - 145 mmol/L   Potassium 4.1 3.5 - 5.1 mmol/L   Chloride  108 98 - 111 mmol/L   CO2 23 22 - 32 mmol/L   Glucose, Bld 105 (H) 70 - 99 mg/dL   BUN 16 6 - 20 mg/dL   Creatinine, Ser 1.06 0.61 - 1.24 mg/dL   Calcium 9.4 8.9 - 10.3 mg/dL   Total Protein 8.1 6.5 - 8.1 g/dL   Albumin 4.1 3.5 - 5.0 g/dL   AST 20 15 - 41 U/L   ALT 24 0 - 44 U/L   Alkaline Phosphatase 96 38 - 126 U/L   Total Bilirubin 1.2 0.3 - 1.2 mg/dL   GFR calc non Af Amer >60 >60 mL/min   GFR calc Af Amer >60 >60 mL/min   Anion gap 8 5 - 15      Assessment & Plan:   Encounter Diagnoses  Name Primary?  . Essential hypertension Yes  . Hyperlipidemia, unspecified hyperlipidemia type   . Tobacco use disorder   . Obesity, unspecified classification, unspecified obesity type, unspecified whether serious comorbidity present      Cont current Encouraged smoking cesstion F/u 3 months.  Pt to contact office sooner prn

## 2019-06-27 ENCOUNTER — Encounter: Payer: Self-pay | Admitting: Physician Assistant

## 2019-06-27 ENCOUNTER — Ambulatory Visit: Payer: HRSA Program | Admitting: Physician Assistant

## 2019-06-27 VITALS — BP 120/82 | HR 69 | Temp 97.5°F | Wt 230.6 lb

## 2019-06-27 DIAGNOSIS — I10 Essential (primary) hypertension: Secondary | ICD-10-CM

## 2019-06-27 DIAGNOSIS — E785 Hyperlipidemia, unspecified: Secondary | ICD-10-CM

## 2019-06-27 DIAGNOSIS — Z2821 Immunization not carried out because of patient refusal: Secondary | ICD-10-CM

## 2019-06-27 DIAGNOSIS — R011 Cardiac murmur, unspecified: Secondary | ICD-10-CM

## 2019-06-27 DIAGNOSIS — F17200 Nicotine dependence, unspecified, uncomplicated: Secondary | ICD-10-CM

## 2019-06-27 MED ORDER — ATORVASTATIN CALCIUM 20 MG PO TABS: 20.0000 mg | ORAL_TABLET | Freq: Every day | ORAL | 1 refills | Status: DC

## 2019-06-27 MED ORDER — AMLODIPINE BESYLATE 10 MG PO TABS: 10.0000 mg | ORAL_TABLET | Freq: Every day | ORAL | 1 refills | Status: DC

## 2019-06-27 MED ORDER — HYDROCHLOROTHIAZIDE 12.5 MG PO CAPS: 12.5000 mg | ORAL_CAPSULE | Freq: Every day | ORAL | 1 refills | Status: DC

## 2019-06-27 NOTE — Progress Notes (Signed)
BP 120/82   Pulse 69   Temp (!) 97.5 F (36.4 C)   Wt 230 lb 9.6 oz (104.6 kg)   SpO2 95%   BMI 33.09 kg/m    Subjective:    Patient ID: Devon Silva, male    DOB: 1960-12-28, 59 y.o.   MRN: [id number]  HPI: Devon Silva is a 59 y.o. male presenting on 06/27/2019 for Hypertension and Hyperlipidemia   HPI  Pt was scheduled for virtual appointment but showed up at the office so he was seen in-person.  Pt had a negative covid 19 screening questionnaire.    -Pt is 59yoM with HTN and dyslipidemia.   -Pt did not get his labs drawn. -Pt given ifobt for colon cancer screening in September that he hasn't returned. -He hasn't gotten covid vaccine and doesn't want it -He is not working -Pt has no complaints today except arthritis pain.  He says [facility] are sending him for xrays.  He says he went to a place in [place] and another place in [place].  -He is planning to get married July 10.      Relevant past medical, surgical, family and social history reviewed and updated as indicated. Interim medical history since our last visit reviewed. Allergies and medications reviewed and updated.   Current Outpatient Medications:  .  amLODipine (NORVASC) 10 MG tablet, Take 1 tablet (10 mg total) by mouth daily., Disp: 90 tablet, Rfl: 1 .  atorvastatin (LIPITOR) 20 MG tablet, Take 1 tablet (20 mg total) by mouth daily., Disp: 90 tablet, Rfl: 1 .  hydrochlorothiazide (MICROZIDE) 12.5 MG capsule, Take 1 capsule (12.5 mg total) by mouth daily., Disp: 30 capsule, Rfl: 1   Review of Systems  Per HPI unless specifically indicated above     Objective:    BP 120/82   Pulse 69   Temp (!) 97.5 F (36.4 C)   Wt 230 lb 9.6 oz (104.6 kg)   SpO2 95%   BMI 33.09 kg/m   Wt Readings from Last 3 Encounters:  06/27/19 230 lb 9.6 oz (104.6 kg)  03/29/19 223 lb (101.2 kg)  10/12/18 222 lb (100.7 kg)    Physical Exam Vitals reviewed.  Constitutional:      General: He is  not in acute distress.    Appearance: He is well-developed. He is not ill-appearing.  HENT:     Head: Normocephalic and atraumatic.  Cardiovascular:     Rate and Rhythm: Normal rate and regular rhythm.  Pulmonary:     Effort: Pulmonary effort is normal.     Breath sounds: Normal breath sounds. No wheezing.  Abdominal:     General: Bowel sounds are normal.     Palpations: Abdomen is soft.     Tenderness: There is no abdominal tenderness.  Musculoskeletal:     Cervical back: Neck supple.     Right lower leg: No edema.     Left lower leg: No edema.  Lymphadenopathy:     Cervical: No cervical adenopathy.  Skin:    General: Skin is warm and dry.  Neurological:     Mental Status: He is alert and oriented to person, place, and time.  Psychiatric:        Behavior: Behavior normal.           Assessment & Plan:    Encounter Diagnoses  Name Primary?  . Essential hypertension Yes  . COVID-19 virus vaccination declined   . Hyperlipidemia, unspecified hyperlipidemia type   .  Tobacco use disorder   . Murmur, heart      -pt decined covid vaccination -pt Needs echo for murmur -he was given  [facility] / application for [employer] financial assistance  -pt counseled to get fasting labs drawn. He will be called with results -pt counseled to return iFOBT for colon cancer screening -pt to follow up 3 months.  He is to contact office sooner prn

## 2019-06-27 NOTE — Patient Instructions (Addendum)
-  Get fasting labs/bloodwork done this week -Return colon cancer screening test/poop test -turn in application for financial assistance
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
## 2019-07-12 ENCOUNTER — Other Ambulatory Visit: Payer: Self-pay | Admitting: Physician Assistant

## 2019-07-14 ENCOUNTER — Ambulatory Visit (HOSPITAL_COMMUNITY): Admission: RE | Admit: 2019-07-14 | Payer: Self-pay | Source: Ambulatory Visit

## 2019-08-23 ENCOUNTER — Other Ambulatory Visit (HOSPITAL_COMMUNITY): Payer: Self-pay

## 2019-09-29 ENCOUNTER — Other Ambulatory Visit (HOSPITAL_COMMUNITY)
Admission: RE | Admit: 2019-09-29 | Discharge: 2019-09-29 | Disposition: A | Discharge status: Home / Self Care | Payer: Self-pay | Source: Ambulatory Visit | Attending: Physician Assistant | Admitting: Physician Assistant

## 2019-09-29 DIAGNOSIS — E785 Hyperlipidemia, unspecified: Secondary | ICD-10-CM

## 2019-09-29 DIAGNOSIS — R69 Illness, unspecified: Secondary | ICD-10-CM | POA: Insufficient documentation

## 2019-09-29 DIAGNOSIS — I10 Essential (primary) hypertension: Secondary | ICD-10-CM

## 2019-10-03 ENCOUNTER — Ambulatory Visit: Payer: Medicaid Other | Admitting: Physician Assistant

## 2019-10-10 ENCOUNTER — Other Ambulatory Visit: Payer: Self-pay | Admitting: Physician Assistant

## 2019-11-01 ENCOUNTER — Other Ambulatory Visit: Payer: Self-pay | Admitting: Physician Assistant

## 2019-11-01 DIAGNOSIS — I10 Essential (primary) hypertension: Secondary | ICD-10-CM

## 2019-11-01 DIAGNOSIS — Z125 Encounter for screening for malignant neoplasm of prostate: Secondary | ICD-10-CM

## 2019-11-01 DIAGNOSIS — R7303 Prediabetes: Secondary | ICD-10-CM

## 2019-11-01 DIAGNOSIS — E785 Hyperlipidemia, unspecified: Secondary | ICD-10-CM

## 2019-11-09 ENCOUNTER — Ambulatory Visit: Payer: Medicaid Other | Admitting: Physician Assistant

## 2020-02-17 ENCOUNTER — Other Ambulatory Visit: Payer: Self-pay

## 2020-02-17 ENCOUNTER — Emergency Department (HOSPITAL_COMMUNITY)
Admission: EM | Admit: 2020-02-17 | Discharge: 2020-02-18 | Disposition: A | Discharge status: Home / Self Care | Payer: HRSA Program | Attending: Emergency Medicine | Admitting: Emergency Medicine

## 2020-02-17 ENCOUNTER — Emergency Department (HOSPITAL_COMMUNITY): Payer: HRSA Program

## 2020-02-17 ENCOUNTER — Encounter (HOSPITAL_COMMUNITY): Payer: Self-pay

## 2020-02-17 DIAGNOSIS — Z87891 Personal history of nicotine dependence: Secondary | ICD-10-CM | POA: Diagnosis not present

## 2020-02-17 DIAGNOSIS — J1282 Pneumonia due to coronavirus disease 2019: Secondary | ICD-10-CM | POA: Insufficient documentation

## 2020-02-17 DIAGNOSIS — R202 Paresthesia of skin: Secondary | ICD-10-CM | POA: Insufficient documentation

## 2020-02-17 DIAGNOSIS — Z79899 Other long term (current) drug therapy: Secondary | ICD-10-CM | POA: Insufficient documentation

## 2020-02-17 DIAGNOSIS — U071 COVID-19: Secondary | ICD-10-CM | POA: Insufficient documentation

## 2020-02-17 DIAGNOSIS — I10 Essential (primary) hypertension: Secondary | ICD-10-CM | POA: Diagnosis not present

## 2020-02-17 DIAGNOSIS — R0602 Shortness of breath: Secondary | ICD-10-CM | POA: Diagnosis present

## 2020-02-17 HISTORY — DX: Endocarditis, valve unspecified: I38

## 2020-02-17 LAB — COMPREHENSIVE METABOLIC PANEL
ALT: 27 U/L (ref 0–44)
AST: 47 U/L — ABNORMAL HIGH (ref 15–41)
Albumin: 3.9 g/dL (ref 3.5–5.0)
Alkaline Phosphatase: 67 U/L (ref 38–126)
Anion gap: 10 (ref 5–15)
BUN: 17 mg/dL (ref 6–20)
CO2: 21 mmol/L — ABNORMAL LOW (ref 22–32)
Calcium: 9.2 mg/dL (ref 8.9–10.3)
Chloride: 103 mmol/L (ref 98–111)
Creatinine, Ser: 1.41 mg/dL — ABNORMAL HIGH (ref 0.61–1.24)
GFR, Estimated: 58 mL/min — ABNORMAL LOW (ref >60)
Glucose, Bld: 115 mg/dL — ABNORMAL HIGH (ref 70–99)
Potassium: 3.7 mmol/L (ref 3.5–5.1)
Sodium: 134 mmol/L — ABNORMAL LOW (ref 135–145)
Total Bilirubin: 1.1 mg/dL (ref 0.3–1.2)
Total Protein: 7.7 g/dL (ref 6.5–8.1)

## 2020-02-17 LAB — CBC WITH DIFFERENTIAL/PLATELET
Abs Immature Granulocytes: 0.04 10*3/uL (ref 0.00–0.07)
Basophils Absolute: 0 10*3/uL (ref 0.0–0.1)
Basophils Relative: 0 %
Eosinophils Absolute: 0 10*3/uL (ref 0.0–0.5)
Eosinophils Relative: 0 %
HCT: 37.4 % — ABNORMAL LOW (ref 39.0–52.0)
Hemoglobin: 12.7 g/dL — ABNORMAL LOW (ref 13.0–17.0)
Immature Granulocytes: 0 %
Lymphocytes Relative: 6 %
Lymphs Abs: 0.6 10*3/uL — ABNORMAL LOW (ref 0.7–4.0)
MCH: 26.5 pg (ref 26.0–34.0)
MCHC: 34 g/dL (ref 30.0–36.0)
MCV: 78.1 fL — ABNORMAL LOW (ref 80.0–100.0)
Monocytes Absolute: 1.1 10*3/uL — ABNORMAL HIGH (ref 0.1–1.0)
Monocytes Relative: 12 %
Neutro Abs: 7.6 10*3/uL (ref 1.7–7.7)
Neutrophils Relative %: 82 %
Platelets: 151 10*3/uL (ref 150–400)
RBC: 4.79 MIL/uL (ref 4.22–5.81)
RDW: 14.4 % (ref 11.5–15.5)
WBC: 9.4 10*3/uL (ref 4.0–10.5)
nRBC: 0 % (ref 0.0–0.2)

## 2020-02-17 LAB — RESP PANEL BY RT-PCR (FLU A&B, COVID) ARPGX2
Influenza A by PCR: NEGATIVE
Influenza B by PCR: NEGATIVE
SARS Coronavirus 2 by RT PCR: POSITIVE — AB

## 2020-02-17 LAB — LACTIC ACID, PLASMA: Lactic Acid, Venous: 1 mmol/L (ref 0.5–1.9)

## 2020-02-17 LAB — LACTATE DEHYDROGENASE: LDH: 276 U/L — ABNORMAL HIGH (ref 98–192)

## 2020-02-17 LAB — TRIGLYCERIDES: Triglycerides: 102 mg/dL (ref <150)

## 2020-02-17 LAB — FIBRINOGEN: Fibrinogen: 510 mg/dL — ABNORMAL HIGH (ref 210–475)

## 2020-02-17 LAB — PROCALCITONIN: Procalcitonin: 0.19 ng/mL

## 2020-02-17 LAB — D-DIMER, QUANTITATIVE: D-Dimer, Quant: 1.88 ug/mL-FEU — ABNORMAL HIGH (ref 0.00–0.50)

## 2020-02-17 MED ORDER — ACETAMINOPHEN 325 MG PO TABS
650.0000 mg | ORAL_TABLET | Freq: Once | ORAL | Status: AC
  Administered 2020-02-17: 650 mg via ORAL
  Filled 2020-02-17: qty 2

## 2020-02-17 NOTE — ED Triage Notes (Addendum)
Pt to er, pt states that he is here because he feels like he can't breath, pt states that 2 hrs ago he started having some numbness in his L arm, states that it happened yesterday and then again today.  States that his son was here yesterday with his son who was dx with covid.  Pt states that he also has some heart problems.  MD Zackowski at bedside to preform a fast exam, fast negative

## 2020-02-17 NOTE — ED Notes (Signed)
Date and time results received: 02/17/20 2340  Test: COVID Critical Value: POSITIVE  Name of Provider Notified: Bebe Shaggy, MD

## 2020-02-17 NOTE — ED Notes (Signed)
ED Provider at bedside. 

## 2020-02-17 NOTE — ED Provider Notes (Signed)
[facility] EMERGENCY DEPARTMENT Provider Note   CSN: [id number] Arrival date & time: 02/17/20  2128     History Chief Complaint  Patient presents with  . Shortness of Breath    Devon Silva is a 60 y.o. male.  The history is provided by the patient.  Shortness of Breath Severity:  Moderate Onset quality:  Gradual Duration:  1 day Timing:  Constant Progression:  Worsening Chronicity:  New Relieved by:  Nothing Worsened by:  Nothing Associated symptoms: chest pain, cough, fever and headaches   Patient presents with fever, cough, shortness of breath It Has been present for the past day.  He has had a Covid exposure He also reports intermittent numbness to the left arm and leg.  No weakness.  These symptoms are improving.      Past Medical History:  Diagnosis Date  . Heart valve problem   . Hypertension     Patient Active Problem List   Diagnosis Date Noted  . Essential hypertension 03/05/2019  . Hyperlipidemia 03/05/2019  . Tobacco use disorder 03/05/2019  . Prediabetes 03/05/2019    History reviewed. No pertinent surgical history.     Family History  Problem Relation Age of Onset  . Cancer Father   . Heart disease Father     Social History   Tobacco Use  . Smoking status: Former Smoker    Packs/day: 0.25    Years: 20.00    Pack years: 5.00    Types: Cigarettes    Quit date: 08/14/2019    Years since quitting: 0.5  . Smokeless tobacco: Never Used  . Tobacco comment: 2 cig/ daily  Vaping Use  . Vaping Use: Never used  Substance Use Topics  . Alcohol use: Not Currently    Alcohol/week: 2.0 standard drinks    Types: 1 Cans of beer, 1 Shots of liquor per week  . Drug use: Not Currently    Home Medications Prior to Admission medications   Medication Sig Start Date End Date Taking? Authorizing Provider  amLODipine (NORVASC) 10 MG tablet TAKE 1 Tablet BY MOUTH ONCE EVERY DAY 07/12/19   Jacquelin Hawking, PA-C  atorvastatin (LIPITOR) 20 MG tablet  TAKE 1 Tablet BY MOUTH ONCE EVERY DAY 07/12/19   Jacquelin Hawking, PA-C  hydrochlorothiazide (MICROZIDE) 12.5 MG capsule Take 1 capsule (12.5 mg total) by mouth daily. 06/27/19   Jacquelin Hawking, PA-C    Allergies    Patient has no known allergies.  Review of Systems   Review of Systems  Constitutional: Positive for fever.  Respiratory: Positive for cough and shortness of breath.   Cardiovascular: Positive for chest pain.  Musculoskeletal: Positive for myalgias.  Neurological: Positive for numbness and headaches. Negative for weakness.  All other systems reviewed and are negative.   Physical Exam Updated Vital Signs BP 113/80   Pulse (!) 115   Temp (!) 103.1 F (39.5 C) (Oral)   Resp (!) 23   Ht 1.778 m (5\' 10" )   Wt 102.5 kg   SpO2 91%   BMI 32.43 kg/m   Physical Exam CONSTITUTIONAL: Well developed/well nourished, ill-appearing HEAD: Normocephalic/atraumatic EYES: EOMI/PERRL ENMT: Mask in place NECK: supple no meningeal signs SPINE/BACK:entire spine nontender CV: S1/S2 noted, no murmurs/rubs/gallops noted LUNGS: Mild tachypnea, crackles bilaterally ABDOMEN: soft, nontender, no rebound or guarding, bowel sounds noted throughout abdomen GU:no cva tenderness NEURO: Pt is awake/alert/appropriate, moves all extremitiesx4.  No facial droop.  No arm or leg drift.  No numbness to the left arm.  Reports  subjective numbness to the left leg EXTREMITIES: pulses normal/equalx4, full ROM SKIN: warm, color normal PSYCH: no abnormalities of mood noted, alert and oriented to situation  ED Results / Procedures / Treatments   Labs (all labs ordered are listed, but only abnormal results are displayed) Labs Reviewed  RESP PANEL BY RT-PCR (FLU A&B, COVID) ARPGX2 - Abnormal; Notable for the following components:      Result Value   SARS Coronavirus 2 by RT PCR POSITIVE (*)    All other components within normal limits  CBC WITH DIFFERENTIAL/PLATELET - Abnormal; Notable for the following  components:   Hemoglobin 12.7 (*)    HCT 37.4 (*)    MCV 78.1 (*)    Lymphs Abs 0.6 (*)    Monocytes Absolute 1.1 (*)    All other components within normal limits  COMPREHENSIVE METABOLIC PANEL - Abnormal; Notable for the following components:   Sodium 134 (*)    CO2 21 (*)    Glucose, Bld 115 (*)    Creatinine, Ser 1.41 (*)    AST 47 (*)    GFR, Estimated 58 (*)    All other components within normal limits  D-DIMER, QUANTITATIVE (NOT AT [facility]) - Abnormal; Notable for the following components:   D-Dimer, Quant 1.88 (*)    All other components within normal limits  LACTATE DEHYDROGENASE - Abnormal; Notable for the following components:   LDH 276 (*)    All other components within normal limits  FIBRINOGEN - Abnormal; Notable for the following components:   Fibrinogen 510 (*)    All other components within normal limits  C-REACTIVE PROTEIN - Abnormal; Notable for the following components:   CRP 7.9 (*)    All other components within normal limits  LACTIC ACID, PLASMA  PROCALCITONIN  FERRITIN  TRIGLYCERIDES    EKG EKG Interpretation  Date/Time:  Friday February 17 2020 22:09:55 EST Ventricular Rate:  117 PR Interval:  148 QRS Duration: 94 QT Interval:  326 QTC Calculation: 454 R Axis:   12 Text Interpretation: Sinus tachycardia with Premature atrial complexes Possible Left atrial enlargement Left ventricular hypertrophy ( Cornell product , Romhilt-Estes ) Cannot rule out Inferior infarct , age undetermined ST & T wave abnormality, consider lateral ischemia Abnormal ECG Confirmed by Zadie Rhine ([id number]) on 02/17/2020 11:07:40 PM   Radiology DG Chest Port 1 View  Result Date: 02/17/2020 CLINICAL DATA:  Shortness of breath. EXAM: PORTABLE CHEST 1 VIEW COMPARISON:  09/07/2017 FINDINGS: Chronic cardiomegaly. Unchanged mediastinal contours. Increased interstitial opacities, with slightly nodular opacities in the lower lung zones, left greater than right. No pneumothorax or  convincing pleural effusion. No acute osseous abnormalities are seen. IMPRESSION: 1. Increased interstitial opacities, with slightly nodular opacities in the lower lung zones, left greater than right. Findings may be pulmonary edema or infection. 2. Chronic cardiomegaly. Electronically Signed   By: Narda Rutherford M.D.   On: 02/17/2020 23:34    Procedures Procedures    Medications Ordered in ED Medications  remdesivir 100 mg in sodium chloride 0.9 % 100 mL IVPB (0 mg Intravenous Stopped 02/18/20 0133)  acetaminophen (TYLENOL) tablet 650 mg (650 mg Oral Given 02/17/20 2326)  remdesivir 100 mg in sodium chloride 0.9 % 100 mL IVPB (0 mg Intravenous Stopped 02/18/20 0218)    ED Course  I have reviewed the triage vital signs and the nursing notes.  Pertinent labs & imaging results that were available during my care of the patient were reviewed by me and considered in my  medical decision making (see chart for details).    MDM Rules/Calculators/A&P                          11:54 PM Patient reported intermittent numbness but I see no evidence of stroke.  Patient is febrile, tachycardic with mild hypoxia.  Patient has Covid pneumonia. 3:00 AM Patient feels improved.  There is no hypoxia with ambulation.  Vitals are appropriate.  He feels comfortable for discharge home BP 114/75   Pulse 92   Temp 100.1 F (37.8 C) (Oral)   Resp 16   Ht 1.778 m (5\' 10" )   Wt 102.5 kg   SpO2 98%   BMI 32.43 kg/m  First dose of remdesivir administered in the ER.  Outpatient referral for further doses have been given.  We discussed strict return precautions  Jehan Bonano was evaluated in Emergency Department on 02/17/2020 for the symptoms described in the history of present illness. He was evaluated in the context of the global COVID-19 pandemic, which necessitated consideration that the patient might be at risk for infection with the SARS-CoV-2 virus that causes COVID-19. Institutional protocols and algorithms  that pertain to the evaluation of patients at risk for COVID-19 are in a state of rapid change based on information released by regulatory bodies including the CDC and federal and state organizations. These policies and algorithms were followed during the patient's care in the ED.  Final Clinical Impression(s) / ED Diagnoses Final diagnoses:  Pneumonia due to COVID-19 virus    Rx / DC Orders ED Discharge Orders    None       Ripley Fraise, MD 02/18/20 0301

## 2020-02-18 ENCOUNTER — Other Ambulatory Visit: Payer: Self-pay

## 2020-02-18 ENCOUNTER — Telehealth (HOSPITAL_COMMUNITY): Payer: Self-pay

## 2020-02-18 LAB — FERRITIN: Ferritin: 316 ng/mL (ref 24–336)

## 2020-02-18 LAB — C-REACTIVE PROTEIN: CRP: 7.9 mg/dL — ABNORMAL HIGH (ref <1.0)

## 2020-02-18 MED ORDER — SODIUM CHLORIDE 0.9 % IV SOLN
100.0000 mg | Freq: Every day | INTRAVENOUS | Status: DC
  Administered 2020-02-18: 100 mg via INTRAVENOUS

## 2020-02-18 MED ORDER — SODIUM CHLORIDE 0.9 % IV SOLN
100.0000 mg | INTRAVENOUS | Status: AC
  Administered 2020-02-18 (×2): 100 mg via INTRAVENOUS
  Filled 2020-02-18 (×2): qty 20

## 2020-02-18 NOTE — Discharge Instructions (Addendum)
You have received remdesivir infusion for the Covid pneumonia. You would benefit from receiving this medication as an outpatient.  You should be hearing from the [facility] health team about receiving this as an outpatient

## 2020-02-18 NOTE — ED Notes (Signed)
Pt ambulated on Pulse Ox. O2 Sats improved to 95% on RA. EDP Notified. Pt reports feeling better.

## 2020-02-18 NOTE — Telephone Encounter (Signed)
RN called to schedule for outpatient Remdesivir infusions at 11:30am on Monday 1/10 and Tuesday 1/11 at [facility]. RN reviewed parking location, at [address] [place], [place], as staff will be escorting the you through the east entrance of the hospital. Appointments take approximately 45 minutes.    Pt informed to call [phone], if he needs to change or cancel his appointment. Pt understands without assistance.

## 2020-02-20 ENCOUNTER — Ambulatory Visit (HOSPITAL_COMMUNITY)
Admission: RE | Admit: 2020-02-20 | Discharge: 2020-02-20 | Disposition: A | Discharge status: Home / Self Care | Payer: Medicaid Other | Source: Ambulatory Visit | Attending: Pulmonary Disease | Admitting: Pulmonary Disease

## 2020-02-20 DIAGNOSIS — J1282 Pneumonia due to coronavirus disease 2019: Secondary | ICD-10-CM | POA: Insufficient documentation

## 2020-02-20 DIAGNOSIS — U071 COVID-19: Secondary | ICD-10-CM | POA: Insufficient documentation

## 2020-02-20 MED ORDER — EPINEPHRINE 0.3 MG/0.3ML IJ SOAJ: 0.3000 mg | Freq: Once | INTRAMUSCULAR | Status: DC | PRN

## 2020-02-20 MED ORDER — SODIUM CHLORIDE 0.9 % IV SOLN: INTRAVENOUS | Status: DC | PRN

## 2020-02-20 MED ORDER — ACETAMINOPHEN 325 MG PO TABS
650.0000 mg | ORAL_TABLET | Freq: Four times a day (QID) | ORAL | Status: DC | PRN
  Administered 2020-02-20: 650 mg via ORAL
  Filled 2020-02-20: qty 2

## 2020-02-20 MED ORDER — METHYLPREDNISOLONE SODIUM SUCC 125 MG IJ SOLR: 125.0000 mg | Freq: Once | INTRAMUSCULAR | Status: DC | PRN

## 2020-02-20 MED ORDER — ALBUTEROL SULFATE HFA 108 (90 BASE) MCG/ACT IN AERS: 2.0000 | INHALATION_SPRAY | Freq: Once | RESPIRATORY_TRACT | Status: DC | PRN

## 2020-02-20 MED ORDER — DIPHENHYDRAMINE HCL 50 MG/ML IJ SOLN: 50.0000 mg | Freq: Once | INTRAMUSCULAR | Status: DC | PRN

## 2020-02-20 MED ORDER — FAMOTIDINE IN NACL 20-0.9 MG/50ML-% IV SOLN: 20.0000 mg | Freq: Once | INTRAVENOUS | Status: DC | PRN

## 2020-02-20 MED ORDER — ONDANSETRON HCL 4 MG/2ML IJ SOLN
4.0000 mg | Freq: Once | INTRAMUSCULAR | Status: AC
  Administered 2020-02-20: 4 mg via INTRAVENOUS
  Filled 2020-02-20: qty 2

## 2020-02-20 MED ORDER — SODIUM CHLORIDE 0.9 % IV SOLN
100.0000 mg | Freq: Once | INTRAVENOUS | Status: AC
  Administered 2020-02-20: 100 mg via INTRAVENOUS

## 2020-02-20 NOTE — Discharge Instructions (Signed)
10 Things You Can Do to Manage Your COVID-19 Symptoms at Home °If you have possible or confirmed COVID-19: °1. Stay home except to get medical care. °2. Monitor your symptoms carefully. If your symptoms get worse, call your healthcare provider immediately. °3. Get rest and stay hydrated. °4. If you have a medical appointment, call the healthcare provider ahead of time and tell them that you have or may have COVID-19. °5. For medical emergencies, call 911 and notify the dispatch personnel that you have or may have COVID-19. °6. Cover your cough and sneezes with a tissue or use the inside of your elbow. °7. Wash your hands often with soap and water for at least 20 seconds or clean your hands with an alcohol-based hand sanitizer that contains at least 60% alcohol. °8. As much as possible, stay in a specific room and away from other people in your home. Also, you should use a separate bathroom, if available. If you need to be around other people in or outside of the home, wear a mask. °9. Avoid sharing personal items with other people in your household, like dishes, towels, and bedding. °10. Clean all surfaces that are touched often, like counters, tabletops, and doorknobs. Use household cleaning sprays or wipes according to the label instructions. °cdc.gov/coronavirus °08/26/2019 °This information is not intended to replace advice given to you by your health care provider. Make sure you discuss any questions you have with your health care provider. °Document Revised: 12/12/2019 Document Reviewed: 12/12/2019 °Elsevier Patient Education © 2021 Elsevier Inc. ° ° ° °If you have any questions or concerns after the infusion please call the Advanced Practice Provider on call at [phone]. This number is ONLY intended for your use regarding questions or concerns about the infusion post-treatment side-effects.  Please do not provide this number to others for use. For return to work notes please contact your primary care  provider.  ° °If someone you know is interested in receiving treatment please have them call the COVID hotline at [phone]. ° ° ° °

## 2020-02-20 NOTE — Progress Notes (Signed)
1100 Pt arrived, coughing, and vomiting. Emesis bag given.    1115  Patient reviewed Fact Sheet for Patients, Parents, and Caregivers for Emergency Use Authorization (EUA) of remdesivir for the Treatment of Coronavirus. Patient also reviewed and is agreeable to the estimated cost of treatment. Patient is agreeable to proceed.

## 2020-02-20 NOTE — Progress Notes (Signed)
  Diagnosis: COVID-19  Physician: Dr. Wright  Procedure: [facility] Med: remdesivir infusion - Provided patient with remdesivir fact sheet for patients, parents and caregivers prior to infusion.  Complications: No immediate complications noted.  Discharge: Discharged home   Devon Silva R Arend Bahl 02/20/2020   

## 2020-02-21 ENCOUNTER — Ambulatory Visit (HOSPITAL_COMMUNITY)
Admission: RE | Admit: 2020-02-21 | Discharge: 2020-02-21 | Disposition: A | Discharge status: Home / Self Care | Payer: Medicaid Other | Source: Ambulatory Visit | Attending: Pulmonary Disease | Admitting: Pulmonary Disease

## 2020-02-21 DIAGNOSIS — U071 COVID-19: Secondary | ICD-10-CM | POA: Diagnosis not present

## 2020-02-21 MED ORDER — FAMOTIDINE IN NACL 20-0.9 MG/50ML-% IV SOLN: 20.0000 mg | Freq: Once | INTRAVENOUS | Status: DC | PRN

## 2020-02-21 MED ORDER — ALBUTEROL SULFATE HFA 108 (90 BASE) MCG/ACT IN AERS: 2.0000 | INHALATION_SPRAY | Freq: Once | RESPIRATORY_TRACT | Status: DC | PRN

## 2020-02-21 MED ORDER — METHYLPREDNISOLONE SODIUM SUCC 125 MG IJ SOLR: 125.0000 mg | Freq: Once | INTRAMUSCULAR | Status: DC | PRN

## 2020-02-21 MED ORDER — DIPHENHYDRAMINE HCL 50 MG/ML IJ SOLN: 50.0000 mg | Freq: Once | INTRAMUSCULAR | Status: DC | PRN

## 2020-02-21 MED ORDER — SODIUM CHLORIDE 0.9 % IV SOLN: INTRAVENOUS | Status: DC | PRN

## 2020-02-21 MED ORDER — SODIUM CHLORIDE 0.9 % IV SOLN
100.0000 mg | Freq: Once | INTRAVENOUS | Status: AC
  Administered 2020-02-21: 100 mg via INTRAVENOUS

## 2020-02-21 MED ORDER — EPINEPHRINE 0.3 MG/0.3ML IJ SOAJ: 0.3000 mg | Freq: Once | INTRAMUSCULAR | Status: DC | PRN

## 2020-02-21 MED ORDER — SODIUM CHLORIDE 0.9 % IV SOLN: 100.0000 mg | Freq: Once | INTRAVENOUS | Status: DC

## 2020-02-21 NOTE — Discharge Instructions (Signed)
10 Things You Can Do to Manage Your COVID-19 Symptoms at Home °If you have possible or confirmed COVID-19: °1. Stay home except to get medical care. °2. Monitor your symptoms carefully. If your symptoms get worse, call your healthcare provider immediately. °3. Get rest and stay hydrated. °4. If you have a medical appointment, call the healthcare provider ahead of time and tell them that you have or may have COVID-19. °5. For medical emergencies, call 911 and notify the dispatch personnel that you have or may have COVID-19. °6. Cover your cough and sneezes with a tissue or use the inside of your elbow. °7. Wash your hands often with soap and water for at least 20 seconds or clean your hands with an alcohol-based hand sanitizer that contains at least 60% alcohol. °8. As much as possible, stay in a specific room and away from other people in your home. Also, you should use a separate bathroom, if available. If you need to be around other people in or outside of the home, wear a mask. °9. Avoid sharing personal items with other people in your household, like dishes, towels, and bedding. °10. Clean all surfaces that are touched often, like counters, tabletops, and doorknobs. Use household cleaning sprays or wipes according to the label instructions. °cdc.gov/coronavirus °08/26/2019 °This information is not intended to replace advice given to you by your health care provider. Make sure you discuss any questions you have with your health care provider. °Document Revised: 12/12/2019 Document Reviewed: 12/12/2019 °Elsevier Patient Education © 2021 Elsevier Inc. ° ° ° °If you have any questions or concerns after the infusion please call the Advanced Practice Provider on call at [phone]. This number is ONLY intended for your use regarding questions or concerns about the infusion post-treatment side-effects.  Please do not provide this number to others for use. For return to work notes please contact your primary care  provider.  ° °If someone you know is interested in receiving treatment please have them call the COVID hotline at [phone]. ° ° ° °

## 2020-02-21 NOTE — Progress Notes (Signed)
  Diagnosis: COVID-19  Physician: Dr. Patrick Wright  Procedure: Cov[facility] Infusion Clinic Med: remdesivir infusion - Provided patient with remdesivir fact sheet for patients, parents and caregivers prior to infusion.  Complications: No immediate complications noted.  Discharge: Discharged home   Devon Silva 02/21/2020  

## 2020-02-21 NOTE — Progress Notes (Signed)
Patient reviewed Fact Sheet for Patients, Parents, and Caregivers for Emergency Use Authorization (EUA) of remdesivir for the Treatment of Coronavirus. Patient also reviewed and is agreeable to the estimated cost of treatment. Patient is agreeable to proceed.    

## 2020-03-21 NOTE — Addendum Note (Signed)
Encounter addended by: Marilynn Rail, RN on: 03/21/2020 6:04 PM  Actions taken: Charge Capture section accepted

## 2020-03-21 NOTE — Addendum Note (Signed)
Encounter addended by: Nikko Goldwire A, RN on: 03/21/2020 5:59 PM  Actions taken: Charge Capture section accepted

## 2020-03-22 ENCOUNTER — Emergency Department (HOSPITAL_COMMUNITY): Payer: HRSA Program

## 2020-03-22 ENCOUNTER — Encounter (HOSPITAL_COMMUNITY): Payer: Self-pay | Admitting: Emergency Medicine

## 2020-03-22 ENCOUNTER — Emergency Department (HOSPITAL_COMMUNITY)
Admission: EM | Admit: 2020-03-22 | Discharge: 2020-03-22 | Disposition: A | Discharge status: Home / Self Care | Payer: HRSA Program | Attending: Emergency Medicine | Admitting: Emergency Medicine

## 2020-03-22 ENCOUNTER — Other Ambulatory Visit: Payer: Self-pay

## 2020-03-22 DIAGNOSIS — Z79899 Other long term (current) drug therapy: Secondary | ICD-10-CM | POA: Diagnosis not present

## 2020-03-22 DIAGNOSIS — I110 Hypertensive heart disease with heart failure: Secondary | ICD-10-CM | POA: Insufficient documentation

## 2020-03-22 DIAGNOSIS — I509 Heart failure, unspecified: Secondary | ICD-10-CM | POA: Insufficient documentation

## 2020-03-22 DIAGNOSIS — Z87891 Personal history of nicotine dependence: Secondary | ICD-10-CM | POA: Diagnosis not present

## 2020-03-22 DIAGNOSIS — U071 COVID-19: Secondary | ICD-10-CM | POA: Diagnosis present

## 2020-03-22 LAB — BASIC METABOLIC PANEL
Anion gap: 11 (ref 5–15)
BUN: 15 mg/dL (ref 6–20)
CO2: 22 mmol/L (ref 22–32)
Calcium: 9.2 mg/dL (ref 8.9–10.3)
Chloride: 108 mmol/L (ref 98–111)
Creatinine, Ser: 1.14 mg/dL (ref 0.61–1.24)
GFR, Estimated: >60 mL/min (ref >60)
Glucose, Bld: 138 mg/dL — ABNORMAL HIGH (ref 70–99)
Potassium: 3.9 mmol/L (ref 3.5–5.1)
Sodium: 141 mmol/L (ref 135–145)

## 2020-03-22 LAB — CBC
HCT: 30.4 % — ABNORMAL LOW (ref 39.0–52.0)
Hemoglobin: 10.2 g/dL — ABNORMAL LOW (ref 13.0–17.0)
MCH: 25.9 pg — ABNORMAL LOW (ref 26.0–34.0)
MCHC: 33.6 g/dL (ref 30.0–36.0)
MCV: 77.2 fL — ABNORMAL LOW (ref 80.0–100.0)
Platelets: 264 10*3/uL (ref 150–400)
RBC: 3.94 MIL/uL — ABNORMAL LOW (ref 4.22–5.81)
RDW: 16.3 % — ABNORMAL HIGH (ref 11.5–15.5)
WBC: 10 10*3/uL (ref 4.0–10.5)
nRBC: 0.5 % — ABNORMAL HIGH (ref 0.0–0.2)

## 2020-03-22 LAB — TROPONIN I (HIGH SENSITIVITY)
Troponin I (High Sensitivity): 11 ng/L (ref <18)
Troponin I (High Sensitivity): 12 ng/L (ref <18)

## 2020-03-22 LAB — MAGNESIUM: Magnesium: 2.3 mg/dL (ref 1.7–2.4)

## 2020-03-22 LAB — BRAIN NATRIURETIC PEPTIDE: B Natriuretic Peptide: 883.8 pg/mL — ABNORMAL HIGH (ref 0.0–100.0)

## 2020-03-22 MED ORDER — FUROSEMIDE 10 MG/ML IJ SOLN
40.0000 mg | Freq: Once | INTRAMUSCULAR | Status: AC
  Administered 2020-03-22: 40 mg via INTRAVENOUS
  Filled 2020-03-22: qty 4

## 2020-03-22 MED ORDER — POTASSIUM CHLORIDE CRYS ER 20 MEQ PO TBCR
20.0000 meq | EXTENDED_RELEASE_TABLET | Freq: Once | ORAL | Status: AC
  Administered 2020-03-22: 20 meq via ORAL
  Filled 2020-03-22: qty 1

## 2020-03-22 MED ORDER — NITROGLYCERIN 2 % TD OINT
1.0000 [in_us] | TOPICAL_OINTMENT | Freq: Four times a day (QID) | TRANSDERMAL | Status: DC
Start: 2020-03-22 — End: 2020-03-22
  Administered 2020-03-22: 1 [in_us] via TOPICAL
  Filled 2020-03-22: qty 1

## 2020-03-22 MED ORDER — FUROSEMIDE 40 MG PO TABS: 40.0000 mg | ORAL_TABLET | Freq: Every day | ORAL | 0 refills | Status: DC

## 2020-03-22 MED ORDER — AEROCHAMBER PLUS FLO-VU LARGE MISC
Status: AC
  Filled 2020-03-22: qty 1

## 2020-03-22 MED ORDER — ALBUTEROL SULFATE HFA 108 (90 BASE) MCG/ACT IN AERS
4.0000 | INHALATION_SPRAY | RESPIRATORY_TRACT | Status: DC | PRN
  Administered 2020-03-22: 4 via RESPIRATORY_TRACT
  Filled 2020-03-22: qty 6.7

## 2020-03-22 NOTE — ED Provider Notes (Signed)
[facility] EMERGENCY DEPARTMENT Provider Note   CSN: [id number] Arrival date & time: 03/22/20  0818     History Chief Complaint  Patient presents with  . Shortness of Breath    Devon Silva is a 60 y.o. male.  The history is provided by the patient and medical records. No language interpreter was used.  Shortness of Breath    60 year old male significant history of valvular heart disease, hypertension, tobacco use, recently diagnosed positive for COVID-19 a month ago presenting complaint of shortness of breath patient states since he was diagnosed with Covid infection a month ago he has had progressive worsening shortness of breath as well as generalized fatigue.  He noticed increased shortness of breath when he ambulates or when he lays flat.  He endorsed a persistent cough.  He does not complain of any fever no loss of taste or smell.  No report of nausea vomiting diarrhea.  Report 10 years ago he had a similar episodes where he was hospitalized for a week due to shortness of breath and fluid retention.  At this time he felt he is in a similar predicament.  He has not been vaccinated for COVID-19.  He has received monoclonal antibody infusion when he was first diagnosed.  Past Medical History:  Diagnosis Date  . Heart valve problem   . Hypertension     Patient Active Problem List   Diagnosis Date Noted  . Essential hypertension 03/05/2019  . Hyperlipidemia 03/05/2019  . Tobacco use disorder 03/05/2019  . Prediabetes 03/05/2019    History reviewed. No pertinent surgical history.     Family History  Problem Relation Age of Onset  . Cancer Father   . Heart disease Father     Social History   Tobacco Use  . Smoking status: Former Smoker    Packs/day: 0.25    Years: 20.00    Pack years: 5.00    Types: Cigarettes    Quit date: 08/14/2019    Years since quitting: 0.6  . Smokeless tobacco: Never Used  . Tobacco comment: 2 cig/ daily  Vaping Use   . Vaping Use: Never used  Substance Use Topics  . Alcohol use: Not Currently    Alcohol/week: 2.0 standard drinks    Types: 1 Cans of beer, 1 Shots of liquor per week  . Drug use: Not Currently    Home Medications Prior to Admission medications   Medication Sig Start Date End Date Taking? Authorizing Provider  amLODipine (NORVASC) 10 MG tablet TAKE 1 Tablet BY MOUTH ONCE EVERY DAY 07/12/19   Jacquelin Hawking, PA-C  atorvastatin (LIPITOR) 20 MG tablet TAKE 1 Tablet BY MOUTH ONCE EVERY DAY 07/12/19   Jacquelin Hawking, PA-C  hydrochlorothiazide (MICROZIDE) 12.5 MG capsule Take 1 capsule (12.5 mg total) by mouth daily. 06/27/19   Jacquelin Hawking, PA-C    Allergies    Patient has no known allergies.  Review of Systems   Review of Systems  Respiratory: Positive for shortness of breath.   All other systems reviewed and are negative.   Physical Exam Updated Vital Signs BP (!) 124/100 (BP Location: Left Arm)   Pulse (!) 101   Temp 98.7 F (37.1 C)   Resp (!) 24   SpO2 100%   Physical Exam Vitals and nursing note reviewed.  Constitutional:      Appearance: He is well-developed and well-nourished.     Comments: Patient appears to be in moderate respiratory discomfort, sitting leaning forward.  HENT:  Head: Atraumatic.  Eyes:     Conjunctiva/sclera: Conjunctivae normal.  Neck:     Vascular: JVD present.  Cardiovascular:     Rate and Rhythm: Tachycardia present.     Heart sounds: Murmur heard.    Pulmonary:     Effort: Tachypnea, accessory muscle usage and respiratory distress present.     Breath sounds: No stridor. Decreased breath sounds and rales present. No wheezing or rhonchi.  Chest:     Chest wall: No tenderness.  Musculoskeletal:     Cervical back: Neck supple.     Right lower leg: Edema present.     Left lower leg: Edema present.     Comments: 1+ edema extending towards the knees bilaterally.  Skin:    Findings: No rash.  Neurological:     Mental Status: He  is alert and oriented to person, place, and time.  Psychiatric:        Mood and Affect: Mood is anxious.     ED Results / Procedures / Treatments   Labs (all labs ordered are listed, but only abnormal results are displayed) Labs Reviewed  BASIC METABOLIC PANEL - Abnormal; Notable for the following components:      Result Value   Glucose, Bld 138 (*)    All other components within normal limits  CBC - Abnormal; Notable for the following components:   RBC 3.94 (*)    Hemoglobin 10.2 (*)    HCT 30.4 (*)    MCV 77.2 (*)    MCH 25.9 (*)    RDW 16.3 (*)    nRBC 0.5 (*)    All other components within normal limits  BRAIN NATRIURETIC PEPTIDE - Abnormal; Notable for the following components:   B Natriuretic Peptide 883.8 (*)    All other components within normal limits  MAGNESIUM  TROPONIN I (HIGH SENSITIVITY)  TROPONIN I (HIGH SENSITIVITY)    EKG EKG Interpretation  Date/Time:  Thursday March 22 2020 08:24:32 EST Ventricular Rate:  108 PR Interval:  160 QRS Duration: 98 QT Interval:  356 QTC Calculation: 477 R Axis:   79 Text Interpretation: Sinus tachycardia with Premature atrial complexes Otherwise normal ECG No significant change since prior 1/22 Confirmed by Meridee Score [phone]) on 03/22/2020 12:17:51 PM   Radiology DG Chest 2 View  Result Date: 03/22/2020 CLINICAL DATA:  Shortness of breath and chest pain.  Cough. EXAM: CHEST - 2 VIEW COMPARISON:  February 17, 2020 FINDINGS: Ill-defined airspace opacity is noted in both mid and lower lung regions. No consolidation. There is cardiomegaly with mild pulmonary venous hypertension. No adenopathy. No bone lesions. IMPRESSION: Ill-defined airspace opacity bilaterally concerning for multifocal pneumonia. Check of COVID-19 status advised. Note that pulmonary edema could present similarly and is a differential consideration. There is cardiomegaly with a degree of pulmonary vascular congestion. No evident adenopathy.  Electronically Signed   By: Bretta Bang III M.D.   On: 03/22/2020 08:43    Procedures Procedures   Medications Ordered in ED Medications  albuterol (VENTOLIN HFA) 108 (90 Base) MCG/ACT inhaler 4 puff (4 puffs Inhalation Given 03/22/20 1300)  nitroGLYCERIN (NITROGLYN) 2 % ointment 1 inch (1 inch Topical Given 03/22/20 1303)  AeroChamber Plus Flo-Vu Large MISC (has no administration in time range)  furosemide (LASIX) injection 40 mg (40 mg Intravenous Given 03/22/20 1306)  potassium chloride SA (KLOR-CON) CR tablet 20 mEq (20 mEq Oral Given 03/22/20 1305)    ED Course  I have reviewed the triage vital signs and the nursing  notes.  Pertinent labs & imaging results that were available during my care of the patient were reviewed by me and considered in my medical decision making (see chart for details).  Clinical Course as of 03/22/20 1515  Thu Mar 22, 2020  2728 60 year old male recently stop smoking and had COVID last month.  Complaining of 1 month of cough and shortness of breath, dyspnea on exertion.  Productive of some white sputum.  Speaking in full sentences in no distress.  Getting labs chest x-ray breathing treatments.  Disposition depends on response to treatment. [MB]    Clinical Course User Index [MB] Terrilee Files, MD   MDM Rules/Calculators/A&P                          BP 117/89   Pulse 86   Temp 98.7 F (37.1 C)   Resp (!) 22   SpO2 100%   Final Clinical Impression(s) / ED Diagnoses Final diagnoses:  Acute congestive heart failure, unspecified heart failure type (HCC)  COVID-19 virus infection    Rx / DC Orders ED Discharge Orders         Ordered    furosemide (LASIX) 40 MG tablet  Daily        03/22/20 1608         12:50 PM Patient with history of valvular disease, diagnosed positive for COVID-19 approximately 1 month ago presenting here with progressive worsening shortness of breath as well as fluid retention.  Patient appears to be fluid  overload with evidence of JVD, wet sounding lungs, and peripheral edema.  Suspect this is likely a CHF exacerbation brought on by insult to the lungs from his Covid infection.  We will initiate treatment including albuterol inhaler, Lasix, Nitropaste, and supplemental oxygen.  Will monitor closely and if needed may initiate BiPAP.  3:52 PM Patient felt much better after receiving albuterol inhaler as well as Lasix.  When ambulate O2 sats remained above 90% on room air.  No worsening shortness of breath.  Patient does have an elevated BNP of 883 without prior value for comparison.  Delta troponin unremarkable, labs otherwise reassuring.  Chest x-ray shows signs of multifocal pneumonia consistent with Covid infection however pulmonary edema acute presents similarly.  I suspect this finding is more consistent with pulmonary edema due to evidence of fluid overload.  Plan to discharge home with a short course of Lasix, albuterol inhaler to use as needed.  Patient will need to follow-up outpatient with cardiology for further evaluation of his CHF benefit from an echocardiogram.  Return precautions given.  Care discussed with Dr. Charm Barges.  Devon Silva was evaluated in Emergency Department on 03/22/2020 for the symptoms described in the history of present illness. He was evaluated in the context of the global COVID-19 pandemic, which necessitated consideration that the patient might be at risk for infection with the SARS-CoV-2 virus that causes COVID-19. Institutional protocols and algorithms that pertain to the evaluation of patients at risk for COVID-19 are in a state of rapid change based on information released by regulatory bodies including the CDC and federal and state organizations. These policies and algorithms were followed during the patient's care in the ED.    Fayrene Helper, PA-C 03/22/20 1615    Terrilee Files, MD 03/22/20 [phone]

## 2020-03-22 NOTE — ED Triage Notes (Addendum)
Patient complains of slowly worsening shortness of breath that started one week ago and got significantly worse last night. Patient states he stopped smoking one month ago, also states he was diagnosed with COVID at that time.  Patient is tachypnic but speaking in complete sentences.

## 2020-03-22 NOTE — ED Notes (Signed)
Pt stood and walked around the bed multiple times with his oxygen dropping to 96%. Pt denied any SHOB. Pt did state that he felt dizzy once he stood still after walking but not while walking.

## 2020-03-22 NOTE — Discharge Instructions (Addendum)
Your shortness of breath and fluid retention is likely signs of heart failure.  Please take Lasix as prescribed for the next 3 days.  Call and follow-up closely with cardiologist for further evaluation.  You may benefit from an echocardiogram of your heart for further assessment.  Use albuterol inhaler 2 puffs every 4 hours as needed for shortness of breath.  Follow instruction below for treatment of your COVID.  Recommendations for at home COVID-19 symptoms management:  Please continue isolation at home. Call [phone] to see whether you might be eligible for therapeutic antibody infusions (leave your name and they will call you back).  If have acute worsening of symptoms please go to ER/urgent care for further evaluation. Check pulse oximetry and if below 90-92% please go to ER. The following supplements MAY help:  Vitamin C 500mg  twice a day and Quercetin 250-500 mg twice a day Vitamin D3 2000 - 4000 u/day B Complex vitamins Zinc 75-100 mg/day Melatonin 6-10 mg at night (the optimal dose is unknown) Aspirin 81mg /day (if no history of bleeding issues)

## 2020-03-22 NOTE — ED Notes (Signed)
Pt is short of breath with NO exertion, sitting on edge of bed. No one else at house is sick currently, has had 1 vaccine.

## 2020-04-01 NOTE — Progress Notes (Deleted)
Cardiology Office Note:    Date:  04/01/2020   ID:  Devon Silva, DOB March 23, 1960, MRN [id number]  PCP:  [facility]  Cardiologist:  No primary care provider on file. *** Advanced Practice Provider:  No care team member to display Electrophysiologist:  None   Referring MD: Jacquelin Hawking, PA-C    History of Present Illness:    Devon Silva is a 60 y.o. male with a hx of HTN, tobacco use, and reported valvular heart disease who was referred by Jacquelin Hawking for further evaluation of SOB.  Patient presented to the ED on 03/22/20 with worsening DOE and LE edema after recent COVID infection. There BNP 883, trop 11-->12. CXR with cardiomegaly and pulmonary vascular congestion. Patient was given lasix and inhalers with improvement and instructed to follow-up with cardiology for further evaluation.  Past Medical History:  Diagnosis Date  . Heart valve problem   . Hypertension     No past surgical history on file.  Current Medications: No outpatient medications have been marked as taking for the 04/02/20 encounter (Appointment) with Meriam Sprague, MD.     Allergies:   Patient has no known allergies.   Social History   Socioeconomic History  . Marital status: Single    Spouse name: Not on file  . Number of children: Not on file  . Years of education: Not on file  . Highest education level: Not on file  Occupational History  . Not on file  Tobacco Use  . Smoking status: Former Smoker    Packs/day: 0.25    Years: 20.00    Pack years: 5.00    Types: Cigarettes    Quit date: 08/14/2019    Years since quitting: 0.6  . Smokeless tobacco: Never Used  . Tobacco comment: 2 cig/ daily  Vaping Use  . Vaping Use: Never used  Substance and Sexual Activity  . Alcohol use: Not Currently    Alcohol/week: 2.0 standard drinks    Types: 1 Cans of beer, 1 Shots of liquor per week  . Drug use: Not Currently  . Sexual  activity: Not on file  Other Topics Concern  . Not on file  Social History Narrative  . Not on file   Social Determinants of Health   Financial Resource Strain: Not on file  Food Insecurity: Not on file  Transportation Needs: Not on file  Physical Activity: Not on file  Stress: Not on file  Social Connections: Not on file     Family History: The patient's ***family history includes Cancer in his father; Heart disease in his father.  ROS:   Please see the history of present illness.    *** All other systems reviewed and are negative.  EKGs/Labs/Other Studies Reviewed:    The following studies were reviewed today: ***  EKG:  EKG is *** ordered today.  The ekg ordered today demonstrates ***  Recent Labs: 02/17/2020: ALT 27 03/22/2020: B Natriuretic Peptide 883.8; BUN 15; Creatinine, Ser 1.14; Hemoglobin 10.2; Magnesium 2.3; Platelets 264; Potassium 3.9; Sodium 141  Recent Lipid Panel    Component Value Date/Time   CHOL 144 02/24/2019 0904   TRIG 102 02/17/2020 2239   HDL 27 (L) 02/24/2019 0904   CHOLHDL 5.3 02/24/2019 0904   VLDL 11 02/24/2019 0904   LDLCALC 106 (H) 02/24/2019 0904     Risk Assessment/Calculations:   {Does this patient have ATRIAL FIBRILLATION?:[id number]}   Physical Exam:    VS:  There were no vitals taken for this visit.    Wt Readings from Last 3 Encounters:  02/17/20 226 lb (102.5 kg)  06/27/19 230 lb 9.6 oz (104.6 kg)  03/29/19 223 lb (101.2 kg)     GEN: *** Well nourished, well developed in no acute distress HEENT: Normal NECK: No JVD; No carotid bruits LYMPHATICS: No lymphadenopathy CARDIAC: ***RRR, no murmurs, rubs, gallops RESPIRATORY:  Clear to auscultation without rales, wheezing or rhonchi  ABDOMEN: Soft, non-tender, non-distended MUSCULOSKELETAL:  No edema; No deformity  SKIN: Warm and dry NEUROLOGIC:  Alert and oriented x 3 PSYCHIATRIC:  Normal affect   ASSESSMENT:    No diagnosis found. PLAN:    In order of problems  listed above:  #Acute HF exacerbation: Unknown EF. BNP 883. Recently overloaded with pulmonary congestion and LE edema on exam in the setting of recent COVID infection. Improved with lasix. -Obtain TTE -Pending TTE, consider lexiscan vs CTA -Continue lasix -Start **** -Daily weights -Low Na diet  #HTN:  #?Valvular heart disease: -TTE as above   {Are you ordering a CV Procedure (e.g. stress test, cath, DCCV, TEE, etc)?   Press F2        :[id number]}    Medication Adjustments/Labs and Tests Ordered: Current medicines are reviewed at length with the patient today.  Concerns regarding medicines are outlined above.  No orders of the defined types were placed in this encounter.  No orders of the defined types were placed in this encounter.   There are no Patient Instructions on file for this visit.   Signed, Meriam Sprague, MD  04/01/2020 10:19 AM    [facility]

## 2020-04-02 ENCOUNTER — Other Ambulatory Visit: Payer: Self-pay

## 2020-04-02 ENCOUNTER — Ambulatory Visit: Payer: Medicaid Other | Admitting: Cardiology

## 2020-04-02 ENCOUNTER — Encounter (HOSPITAL_COMMUNITY): Payer: Self-pay | Admitting: Emergency Medicine

## 2020-04-02 ENCOUNTER — Emergency Department (HOSPITAL_COMMUNITY): Payer: Self-pay

## 2020-04-02 ENCOUNTER — Encounter (INDEPENDENT_AMBULATORY_CARE_PROVIDER_SITE_OTHER): Payer: Medicaid Other | Admitting: Physician Assistant

## 2020-04-02 ENCOUNTER — Observation Stay (HOSPITAL_COMMUNITY)
Admission: EM | Admit: 2020-04-02 | Discharge: 2020-04-04 | Disposition: A | Discharge status: Home / Self Care | Payer: Self-pay | Attending: Cardiovascular Disease | Admitting: Cardiovascular Disease

## 2020-04-02 DIAGNOSIS — I340 Nonrheumatic mitral (valve) insufficiency: Secondary | ICD-10-CM | POA: Insufficient documentation

## 2020-04-02 DIAGNOSIS — Z7901 Long term (current) use of anticoagulants: Secondary | ICD-10-CM | POA: Insufficient documentation

## 2020-04-02 DIAGNOSIS — Z79899 Other long term (current) drug therapy: Secondary | ICD-10-CM | POA: Insufficient documentation

## 2020-04-02 DIAGNOSIS — Z87891 Personal history of nicotine dependence: Secondary | ICD-10-CM | POA: Insufficient documentation

## 2020-04-02 DIAGNOSIS — J9601 Acute respiratory failure with hypoxia: Secondary | ICD-10-CM | POA: Insufficient documentation

## 2020-04-02 DIAGNOSIS — F1410 Cocaine abuse, uncomplicated: Secondary | ICD-10-CM | POA: Insufficient documentation

## 2020-04-02 DIAGNOSIS — F1910 Other psychoactive substance abuse, uncomplicated: Secondary | ICD-10-CM

## 2020-04-02 DIAGNOSIS — D72829 Elevated white blood cell count, unspecified: Secondary | ICD-10-CM | POA: Insufficient documentation

## 2020-04-02 DIAGNOSIS — I5043 Acute on chronic combined systolic (congestive) and diastolic (congestive) heart failure: Secondary | ICD-10-CM | POA: Insufficient documentation

## 2020-04-02 DIAGNOSIS — F17200 Nicotine dependence, unspecified, uncomplicated: Secondary | ICD-10-CM | POA: Diagnosis present

## 2020-04-02 DIAGNOSIS — I110 Hypertensive heart disease with heart failure: Principal | ICD-10-CM | POA: Insufficient documentation

## 2020-04-02 DIAGNOSIS — I10 Essential (primary) hypertension: Secondary | ICD-10-CM | POA: Diagnosis present

## 2020-04-02 DIAGNOSIS — E785 Hyperlipidemia, unspecified: Secondary | ICD-10-CM | POA: Insufficient documentation

## 2020-04-02 DIAGNOSIS — D649 Anemia, unspecified: Secondary | ICD-10-CM | POA: Insufficient documentation

## 2020-04-02 DIAGNOSIS — R0602 Shortness of breath: Secondary | ICD-10-CM

## 2020-04-02 DIAGNOSIS — Z20822 Contact with and (suspected) exposure to covid-19: Secondary | ICD-10-CM | POA: Insufficient documentation

## 2020-04-02 DIAGNOSIS — I509 Heart failure, unspecified: Secondary | ICD-10-CM

## 2020-04-02 DIAGNOSIS — R7303 Prediabetes: Secondary | ICD-10-CM | POA: Insufficient documentation

## 2020-04-02 DIAGNOSIS — Z8616 Personal history of COVID-19: Secondary | ICD-10-CM | POA: Insufficient documentation

## 2020-04-02 DIAGNOSIS — I50810 Right heart failure, unspecified: Secondary | ICD-10-CM | POA: Insufficient documentation

## 2020-04-02 LAB — CBC
HCT: 31.2 % — ABNORMAL LOW (ref 39.0–52.0)
Hemoglobin: 10.7 g/dL — ABNORMAL LOW (ref 13.0–17.0)
MCH: 25.5 pg — ABNORMAL LOW (ref 26.0–34.0)
MCHC: 34.3 g/dL (ref 30.0–36.0)
MCV: 74.5 fL — ABNORMAL LOW (ref 80.0–100.0)
Platelets: 315 10*3/uL (ref 150–400)
RBC: 4.19 MIL/uL — ABNORMAL LOW (ref 4.22–5.81)
RDW: 16.8 % — ABNORMAL HIGH (ref 11.5–15.5)
WBC: 11.5 10*3/uL — ABNORMAL HIGH (ref 4.0–10.5)
nRBC: 0.5 % — ABNORMAL HIGH (ref 0.0–0.2)

## 2020-04-02 LAB — BASIC METABOLIC PANEL
Anion gap: 7 (ref 5–15)
BUN: 22 mg/dL — ABNORMAL HIGH (ref 6–20)
CO2: 22 mmol/L (ref 22–32)
Calcium: 8.8 mg/dL — ABNORMAL LOW (ref 8.9–10.3)
Chloride: 111 mmol/L (ref 98–111)
Creatinine, Ser: 1.06 mg/dL (ref 0.61–1.24)
GFR, Estimated: >60 mL/min (ref >60)
Glucose, Bld: 112 mg/dL — ABNORMAL HIGH (ref 70–99)
Potassium: 4.3 mmol/L (ref 3.5–5.1)
Sodium: 140 mmol/L (ref 135–145)

## 2020-04-02 LAB — TROPONIN I (HIGH SENSITIVITY)
Troponin I (High Sensitivity): 55 ng/L — ABNORMAL HIGH (ref <18)
Troponin I (High Sensitivity): 55 ng/L — ABNORMAL HIGH (ref <18)

## 2020-04-02 NOTE — ED Triage Notes (Signed)
[facility] with CHF and fluid over load, he was seen here recently on 2/10 was diagnosed with CHF and sent home with short course of lasix and pt was unaware he was supposed to be taking this. He went to cardiology follow up today and was sent here due to fluid over load and shortness of breath.

## 2020-04-02 NOTE — Progress Notes (Signed)
Mr. Mastrangelo was initially scheduled to see Dr. Shari Prows in [facility] office today, however he came to [facility] office instead, therefore he was added on my 3:45PM schedule as a urgent case.  Patient was extremely short of breath while in the waiting room and was evaluated by our triage nurse.   Patient is new to cardiology service and was diagnosed with COVID-19 pneumonia on 02/17/2020.  More recently patient went to the ED again on 03/22/2020, chest x-ray showed possible multifocal pneumonia.  It was found patient was also volume overloaded.  He was discharged on a short course of 40 mg daily of Lasix.  His breathing was extremely labored.  Before he was seen by me, he was already evaluated by triage team who felt with his significant shortness of breath, he need to be evaluated in the emergency room.  EMS service has been called and the patient was transported.  Note, previous echocardiogram obtained in June 2018 showed EF 40 to 45%, regional wall motion abnormality was global hypokinesis.  Cardiac catheterization showed 40% left circumflex lesion, no significant coronary artery disease otherwise.

## 2020-04-03 ENCOUNTER — Emergency Department (HOSPITAL_BASED_OUTPATIENT_CLINIC_OR_DEPARTMENT_OTHER): Payer: Self-pay

## 2020-04-03 ENCOUNTER — Observation Stay (HOSPITAL_COMMUNITY): Payer: Self-pay

## 2020-04-03 ENCOUNTER — Ambulatory Visit: Payer: Medicaid Other | Admitting: Cardiovascular Disease

## 2020-04-03 DIAGNOSIS — R0600 Dyspnea, unspecified: Secondary | ICD-10-CM

## 2020-04-03 DIAGNOSIS — I361 Nonrheumatic tricuspid (valve) insufficiency: Secondary | ICD-10-CM

## 2020-04-03 DIAGNOSIS — J9601 Acute respiratory failure with hypoxia: Secondary | ICD-10-CM | POA: Diagnosis present

## 2020-04-03 DIAGNOSIS — I10 Essential (primary) hypertension: Secondary | ICD-10-CM

## 2020-04-03 DIAGNOSIS — I5043 Acute on chronic combined systolic (congestive) and diastolic (congestive) heart failure: Secondary | ICD-10-CM

## 2020-04-03 DIAGNOSIS — I340 Nonrheumatic mitral (valve) insufficiency: Secondary | ICD-10-CM

## 2020-04-03 LAB — CBC
HCT: 32.7 % — ABNORMAL LOW (ref 39.0–52.0)
Hemoglobin: 11 g/dL — ABNORMAL LOW (ref 13.0–17.0)
MCH: 25.1 pg — ABNORMAL LOW (ref 26.0–34.0)
MCHC: 33.6 g/dL (ref 30.0–36.0)
MCV: 74.7 fL — ABNORMAL LOW (ref 80.0–100.0)
Platelets: 341 10*3/uL (ref 150–400)
RBC: 4.38 MIL/uL (ref 4.22–5.81)
RDW: 16.9 % — ABNORMAL HIGH (ref 11.5–15.5)
WBC: 11.4 10*3/uL — ABNORMAL HIGH (ref 4.0–10.5)
nRBC: 0.3 % — ABNORMAL HIGH (ref 0.0–0.2)

## 2020-04-03 LAB — CREATININE, SERUM
Creatinine, Ser: 1.19 mg/dL (ref 0.61–1.24)
GFR, Estimated: >60 mL/min (ref >60)

## 2020-04-03 LAB — HIV ANTIBODY (ROUTINE TESTING W REFLEX): HIV Screen 4th Generation wRfx: NONREACTIVE

## 2020-04-03 LAB — ECHOCARDIOGRAM COMPLETE
Area-P 1/2: 3.12 cm2
Calc EF: 48.5 %
Height: 71 in
MV M vel: 5.41 m/s
MV Peak grad: 117.1 mmHg
Radius: 0.7 cm
S' Lateral: 4.8 cm
Single Plane A2C EF: 45.7 %
Single Plane A4C EF: 49.7 %
Weight: 3440 oz

## 2020-04-03 LAB — RESP PANEL BY RT-PCR (FLU A&B, COVID) ARPGX2
Influenza A by PCR: NEGATIVE
Influenza B by PCR: NEGATIVE
SARS Coronavirus 2 by RT PCR: NEGATIVE

## 2020-04-03 LAB — TSH: TSH: 1.815 u[IU]/mL (ref 0.350–4.500)

## 2020-04-03 LAB — RAPID URINE DRUG SCREEN, HOSP PERFORMED
Amphetamines: NOT DETECTED
Barbiturates: NOT DETECTED
Benzodiazepines: NOT DETECTED
Cocaine: NOT DETECTED
Opiates: NOT DETECTED
Tetrahydrocannabinol: NOT DETECTED

## 2020-04-03 LAB — BRAIN NATRIURETIC PEPTIDE: B Natriuretic Peptide: 948.3 pg/mL — ABNORMAL HIGH (ref 0.0–100.0)

## 2020-04-03 LAB — CBG MONITORING, ED: Glucose-Capillary: 121 mg/dL — ABNORMAL HIGH (ref 70–99)

## 2020-04-03 MED ORDER — CARVEDILOL 12.5 MG PO TABS
6.2500 mg | ORAL_TABLET | Freq: Two times a day (BID) | ORAL | Status: DC
  Administered 2020-04-04: 6.25 mg via ORAL
  Filled 2020-04-03: qty 1

## 2020-04-03 MED ORDER — IOHEXOL 350 MG/ML SOLN
50.0000 mL | Freq: Once | INTRAVENOUS | Status: AC | PRN
  Administered 2020-04-03: 50 mL via INTRAVENOUS

## 2020-04-03 MED ORDER — FUROSEMIDE 20 MG PO TABS
40.0000 mg | ORAL_TABLET | Freq: Every day | ORAL | Status: DC
  Administered 2020-04-04: 40 mg via ORAL
  Filled 2020-04-03: qty 2

## 2020-04-03 MED ORDER — FUROSEMIDE 10 MG/ML IJ SOLN
40.0000 mg | Freq: Two times a day (BID) | INTRAMUSCULAR | Status: AC
  Administered 2020-04-03 – 2020-04-04 (×2): 40 mg via INTRAVENOUS
  Filled 2020-04-03 (×2): qty 4

## 2020-04-03 MED ORDER — FUROSEMIDE 10 MG/ML IJ SOLN
20.0000 mg | Freq: Once | INTRAMUSCULAR | Status: AC
  Administered 2020-04-03: 20 mg via INTRAVENOUS
  Filled 2020-04-03: qty 2

## 2020-04-03 MED ORDER — INSULIN ASPART 100 UNIT/ML ~~LOC~~ SOLN: 0.0000 [IU] | Freq: Three times a day (TID) | SUBCUTANEOUS | Status: DC

## 2020-04-03 MED ORDER — ENOXAPARIN SODIUM 40 MG/0.4ML ~~LOC~~ SOLN
40.0000 mg | SUBCUTANEOUS | Status: DC
  Administered 2020-04-03: 40 mg via SUBCUTANEOUS
  Filled 2020-04-03: qty 0.4

## 2020-04-03 MED ORDER — ONDANSETRON HCL 4 MG/2ML IJ SOLN: 4.0000 mg | Freq: Four times a day (QID) | INTRAMUSCULAR | Status: DC | PRN

## 2020-04-03 MED ORDER — INSULIN ASPART 100 UNIT/ML ~~LOC~~ SOLN: 0.0000 [IU] | Freq: Every day | SUBCUTANEOUS | Status: DC

## 2020-04-03 MED ORDER — SODIUM CHLORIDE 0.9% FLUSH: 3.0000 mL | INTRAVENOUS | Status: DC | PRN

## 2020-04-03 MED ORDER — ASPIRIN EC 81 MG PO TBEC
81.0000 mg | DELAYED_RELEASE_TABLET | Freq: Every day | ORAL | Status: DC
Start: 2020-04-03 — End: 2020-04-04
  Administered 2020-04-03 – 2020-04-04 (×2): 81 mg via ORAL
  Filled 2020-04-03 (×2): qty 1

## 2020-04-03 MED ORDER — ATORVASTATIN CALCIUM 80 MG PO TABS
80.0000 mg | ORAL_TABLET | Freq: Every day | ORAL | Status: DC
  Filled 2020-04-03: qty 1

## 2020-04-03 MED ORDER — SODIUM CHLORIDE 0.9% FLUSH
3.0000 mL | Freq: Two times a day (BID) | INTRAVENOUS | Status: DC
  Administered 2020-04-03 – 2020-04-04 (×3): 3 mL via INTRAVENOUS

## 2020-04-03 MED ORDER — ACETAMINOPHEN 325 MG PO TABS: 650.0000 mg | ORAL_TABLET | ORAL | Status: DC | PRN

## 2020-04-03 MED ORDER — SODIUM CHLORIDE 0.9 % IV SOLN: 250.0000 mL | INTRAVENOUS | Status: DC | PRN

## 2020-04-03 NOTE — ED Provider Notes (Addendum)
[facility] EMERGENCY DEPARTMENT Provider Note   CSN: [id number] Arrival date & time: 04/02/20  1558     History Chief Complaint  Patient presents with  . Shortness of Breath    Devon Silva is a 60 y.o. male with a history of mitral regurgitation, cocaine use, marijuana use disorder, CAD ADD, hypertensive urgency, HTN, HLD, tobacco use disorder, prediabetes who presents the emergency department by EMS from cardiology office with a chief complaint of shortness of breath.  The patient was seen in the ER on 2/10 for shortness of breath that was thought to be an acute CHF exacerbation.  He was given Lasix in the ER with significant improvement in his symptoms and was discharged home with a short course of Lasix and follow-up to cardiology.  The patient states that he was never able to get the prescription of Lasix filled at the pharmacy due to a pharmacy error.  Today, he went to his cardiology appointments, which was located on the second floor.  After getting up to the second floor, he was dizzy and lightheaded and felt as if he might pass out's with shortness of breath.  Front office staff called EMS and he was brought to the emergency department for evaluation.  In the ER, he reports that he has been feeling shortness of breath since he was seen in the ER that has progressively worsened since his ER visit.  He also reports constant, gradually worsening chest pain.  Chest pain is substernal and he characterizes it is "all the things -- sharp, pressure, tightness, aching, etc." No known aggravating or alleviating factors.  He also endorses a abdominal swelling, bilateral lower extremity swelling, non-productive cough, orthopnea, PND, and fatigue.  States that prior to this episode, he only slept with one pillow, but is now sleeping with 4-5 pillows or sitting upright.  He also reports that he has been gaining weight, but notes that he also had a significant weight loss in  January 2022 secondary to Covid.  He denies fever, chills, palpitations, back pain, syncope, numbness, weakness, visual changes, vomiting, diarrhea.  Per chart review, from an admission in January 2019 from [facility] "He has history of HFrEF (EF 40-45%), MR, HTN. Heart cath in 07/18 revealed 40% stenosis in mid left circumflex."  ECHO in 6/18 with EF of 40 to 45%. Systolic function was moderately reduced. Global hypokinesis. Mild LVH. Doppler parameters consistent with restrictive physiology, indicative of decreased left ventricular diastolic compliance and/or increased left atrial pressure. Severe mitral regurgitation. He had a negative exercise stress test in June 2018. NM stress test in June 2018 demonstrated a small area of fixed defect in the mid to apical inferior septal location.  He is a former smoker.  Reports that he quit just over 3 months ago.  He also reports that he has abstained from cocaine and marijuana for more than 3 months.  No other illicit or recreational substance use.  He reports minimal, infrequent alcohol use.  The history is provided by medical records and the patient. No language interpreter was used.       Past Medical History:  Diagnosis Date  . Heart valve problem   . Hypertension     Patient Active Problem List   Diagnosis Date Noted  . Essential hypertension 03/05/2019  . Hyperlipidemia 03/05/2019  . Tobacco use disorder 03/05/2019  . Prediabetes 03/05/2019    History reviewed. No pertinent surgical history.     Family History  Problem Relation Age  of Onset  . Cancer Father   . Heart disease Father     Social History   Tobacco Use  . Smoking status: Former Smoker    Packs/day: 0.25    Years: 20.00    Pack years: 5.00    Types: Cigarettes    Quit date: 08/14/2019    Years since quitting: 0.6  . Smokeless tobacco: Never Used  . Tobacco comment: 2 cig/ daily  Vaping Use  . Vaping Use: Never used  Substance Use Topics  . Alcohol  use: Not Currently    Alcohol/week: 2.0 standard drinks    Types: 1 Cans of beer, 1 Shots of liquor per week  . Drug use: Not Currently    Home Medications Prior to Admission medications   Medication Sig Start Date End Date Taking? Authorizing Provider  furosemide (LASIX) 40 MG tablet Take 1 tablet (40 mg total) by mouth daily. Patient not taking: Reported on 04/03/2020 03/22/20   Fayrene Helper, PA-C    Allergies    Patient has no known allergies.  Review of Systems   Review of Systems  Constitutional: Positive for fatigue. Negative for appetite change, chills and fever.  HENT: Negative for congestion and sore throat.   Eyes: Negative for visual disturbance.  Respiratory: Positive for cough and shortness of breath. Negative for chest tightness.        PND Orthopnea  Cardiovascular: Positive for chest pain and leg swelling. Negative for palpitations.  Gastrointestinal: Positive for abdominal distention. Negative for abdominal pain, diarrhea, nausea and vomiting.  Genitourinary: Negative for dysuria and urgency.  Musculoskeletal: Negative for back pain, myalgias, neck pain and neck stiffness.  Skin: Negative for rash.  Allergic/Immunologic: Negative for immunocompromised state.  Neurological: Positive for dizziness and light-headedness. Negative for seizures, weakness, numbness and headaches.  Psychiatric/Behavioral: Negative for confusion.    Physical Exam Updated Vital Signs BP 119/89   Pulse (!) 45   Temp 98 F (36.7 C) (Oral)   Resp 20   Ht 5\' 10"  (1.778 m)   Wt 97.5 kg   SpO2 100%   BMI 30.85 kg/m   Physical Exam Vitals and nursing note reviewed.  Constitutional:      Appearance: He is well-developed. He is not ill-appearing, toxic-appearing or diaphoretic.  HENT:     Head: Normocephalic.  Eyes:     Conjunctiva/sclera: Conjunctivae normal.  Neck:     Comments: +JVD Cardiovascular:     Rate and Rhythm: Normal rate and regular rhythm.     Heart sounds: No  murmur heard.   Pulmonary:     Effort: Pulmonary effort is normal.     Breath sounds: Rales present.     Comments: Bilateral rales.  No rhonchi or wheezes.  Mild tachypnea.  No accessory muscle use or retractions. Abdominal:     General: There is distension.     Palpations: Abdomen is soft. There is no mass.     Tenderness: There is no abdominal tenderness. There is no right CVA tenderness, left CVA tenderness or rebound.     Hernia: No hernia is present.     Comments: Abdomen is distended, but soft.  Positive fluid wave.  Musculoskeletal:        General: No tenderness.     Cervical back: Neck supple.     Right lower leg: Edema present.     Left lower leg: Edema present.     Comments: 2+ pitting edema to the bilateral lower extremities.  Skin:  General: Skin is warm and dry.  Neurological:     Mental Status: He is alert and oriented to person, place, and time.  Psychiatric:        Behavior: Behavior normal.     ED Results / Procedures / Treatments   Labs (all labs ordered are listed, but only abnormal results are displayed) Labs Reviewed  BASIC METABOLIC PANEL - Abnormal; Notable for the following components:      Result Value   Glucose, Bld 112 (*)    BUN 22 (*)    Calcium 8.8 (*)    All other components within normal limits  CBC - Abnormal; Notable for the following components:   WBC 11.5 (*)    RBC 4.19 (*)    Hemoglobin 10.7 (*)    HCT 31.2 (*)    MCV 74.5 (*)    MCH 25.5 (*)    RDW 16.8 (*)    nRBC 0.5 (*)    All other components within normal limits  BRAIN NATRIURETIC PEPTIDE - Abnormal; Notable for the following components:   B Natriuretic Peptide 948.3 (*)    All other components within normal limits  TROPONIN I (HIGH SENSITIVITY) - Abnormal; Notable for the following components:   Troponin I (High Sensitivity) 55 (*)    All other components within normal limits  TROPONIN I (HIGH SENSITIVITY) - Abnormal; Notable for the following components:   Troponin  I (High Sensitivity) 55 (*)    All other components within normal limits  RESP PANEL BY RT-PCR (FLU A&B, COVID) [id number]    EKG EKG Interpretation  Date/Time:  Monday April 02 2020 16:10:12 EST Ventricular Rate:  95 PR Interval:  150 QRS Duration: 94 QT Interval:  374 QTC Calculation: 469 R Axis:   80 Text Interpretation: Sinus rhythm with Premature supraventricular complexes and with occasional Premature ventricular complexes Confirmed by Nicanor Alcon, April ([id number]) on 04/03/2020 2:17:43 AM   Radiology DG Chest 2 View  Result Date: 04/02/2020 CLINICAL DATA:  Shortness of breath x1 day EXAM: CHEST - 2 VIEW COMPARISON:  Chest radiograph March 22, 2020 FINDINGS: Cardiomegaly, similar prior. Central vascular congestion. Similar hazy bilateral interstitial and airspace opacities. No pleural effusion. No pneumothorax. The visualized skeletal structures are unremarkable. IMPRESSION: Stable cardiomegaly and central vascular congestion. Similar hazy bilateral interstitial and airspace opacities most consistent with pulmonary edema. Electronically Signed   By: Maudry Mayhew MD   On: 04/02/2020 16:33    Procedures Procedures   Medications Ordered in ED Medications  furosemide (LASIX) injection 20 mg (20 mg Intravenous Given 04/03/20 0536)    ED Course  I have reviewed the triage vital signs and the nursing notes.  Pertinent labs & imaging results that were available during my care of the patient were reviewed by me and considered in my medical decision making (see chart for details).    MDM Rules/Calculators/A&P                          60 year old male with a history of mitral regurgitation, cocaine use, marijuana use disorder, CAD ADD, hypertensive urgency, HTN, HLD, tobacco use disorder, prediabetes who presents to the emergency department with shortness of breath, chest pain, nonproductive cough, abdominal and bilateral lower extremity swelling, orthopnea, and PND for more than 2  weeks that has been gradually worsening since he was seen in the ER on February 10.  Today, he had a follow-up appointment with cardiology and after getting to the second  floor for his appointment, he was dizzy, lightheaded, and significantly more short of breath and EMS was called to bring him to the ER for evaluation.  Normotensive.  Afebrile.  No tachycardia.  Minimally tachypneic.  On exam, he appears volume overloaded with JVD, 2+ peripheral pitting edema, and abdominal distention.  He has rales in the bilateral lungs.  Labs have been reviewed and independently interpreted by me.   EKG with sinus rhythm and PVCs and supraventricular contractions. Troponin is elevated at 55 and repeat troponin is 55.  I suspect this is secondary to demand, but patient is actively having chest pain.  No metabolic derangements.  BNP is 948, increased from previous.  Chest x-ray with cardiomegaly and vascular congestion/pulmonary edema bilaterally.  Patient was given IV Lasix.  RN reports that patient began hypoxic in the room with minimal exertion while in bed with good waveform on the monitor.  He was placed on 1 L nasal cannula.  The patient was seen and independently evaluated by Dr. Pilar Plate, attending physician, who is in agreement with work-up and plan.  Given new oxygen requirement, he will require admission.  Consult to cardiology and Laverda Page, cardiology NP, will accept the patient for admission.   The patient appears reasonably stabilized for admission considering the current resources, flow, and capabilities available in the ED at this time, and I doubt any other [facility] requiring further screening and/or treatment in the ED prior to admission.   Final Clinical Impression(s) / ED Diagnoses Final diagnoses:  Acute congestive heart failure, unspecified heart failure type (HCC)  Acute respiratory failure with hypoxia [facility])    Rx / DC Orders ED Discharge Orders    None       McDonald, Mia A,  PA-C 04/03/20 0707    Barkley Boards, PA-C 04/03/20 [id number]    Sabas Sous, MD 04/03/20 [phone]

## 2020-04-03 NOTE — ED Notes (Signed)
Lunch Tray Ordered @ 1006.  

## 2020-04-03 NOTE — H&P (Signed)
Cardiology Admission History and Physical:   Patient ID: Devon Silva MRN: [id number]; DOB: May 11, 1960   Admission date: 04/02/2020  PCP:  Julio AlmMcElroy, Shannon, PA-C   [facility]  Cardiologist:  [facility]; Dr. Duke Salviaandolph Advanced Practice Provider:  No care team member to display Electrophysiologist:  None   Chief Complaint:  SOB  Patient Profile:   Devon Silva is a 60 y.o. 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  History of Present Illness:   Mr. Devon Silva has had several ED visits since the start of the year. He was diagnosed with COVID-19 02/17/20 after presenting to the ED with SOB, cough, and fever. He was given remdesivir and discharged home with recommendations for close outpatient follow-up with PCP, which he failed to do. Appears he had 2 additional remdesivir infusions 1/10 and 1/11. He was then evaluated in the ED 03/22/20 with complaints of SOB which had progressively worsened since COVID-19 infection the month prior. He had complaints of DOE, orthopnea, and persistent cough. BNP was elevated to 883 with negative HsTrops. CXR showed cardiomegaly with a degree of pulmonary vascular congestion and finding c/f multifocal PNA. He was given IV lasix and decision made to discharge home on a short course of po lasix 40mg  daily. He was recommended to follow-up outpatient with cardiology given history of HFrEF and non-obstructive CAD. He was scheduled to see Dr. Shari ProwsPemberton 04/02/20, however due to a miscommunication, the [facility], at which point he was markedly SOB with labored breathing. EMS was activated prior to evaluation and [facility] work[facility]up/management.   On arrival to the ED he was mildly hypertensive and tachypneic, otherwise VSS. Labs notable  for electrolyes wnl, Cr 1.14>1.06, Hgb 10.2>10.7, PLT 264, WBC 10>11.5, BNP 948, HsTrop 55>55, COVID-19/influenza negative. CXR with stable cardiomegaly and central vascular congestion c/w pulmonary edema. EKG with sinus rhythm, rate 95 bpm, PVC and PACs, isolated TWI in aVL, no STE/D; no significant change from previous. He was given IV lasix 20mg  in the ED with UOP -800mL so far. Cardiology asked to evaluate.   It appears his cardiac history dates back to 2018 when [place]B and orthopnea. Echo at that time showed EF 40-45%, global hypokinesis, restrictive physiology, and severe functional MR. He underwent a NST at that time which showed a fixed defect in the mid-apical inferoseptal location. He underwent a LHC in 2018 showed showed 10% dLM stenosis, 30% mLAD stenosis, 40% mLCx stenosis, and 30% dRCA stenosis. He was medically managed with aspirin, carvedilol, losartan, hydralazine, and atorvastatin at that time. He was subsequently lost to follow-up and appears to have not been on medications for several years.   At the time of this evaluation he reports some improvement in his breathing. He is quite tired and falls asleep several times during our conversation. He attributes this to getting little sleep last night due to interruptions and telemetry alarm sounding frequently. He reports for the past month he has had progressive DOE, now with SOB at rest, orthopnea, PND, and abdominal bloating. He reports prior issue with this several years ago. He moved to [place] 2 years ago and reports following with the free clinic in [place] for [place]. He reported compliance with cardiac medications up until  3-4 months ago when he ran out. He is currently unemployed. He tells me he was able to quit tobacco and cocaine use 3-4 months ago as well, for which he was congratulated. He denies exertional chest pain, though has had some upper abdomen/lower chest tightness  over the past month which he attributes to fluid buildup. He denies palpitations, dizziness, lightheadedness, syncope, or daytime somnolence.   Past Medical History:  Diagnosis Date  . Heart valve problem   . Hypertension     History reviewed. No pertinent surgical history.   Medications Prior to Admission: Prior to Admission medications   Medication Sig Start Date End Date Taking? Authorizing Provider  furosemide (LASIX) 40 MG tablet Take 1 tablet (40 mg total) by mouth daily. Patient not taking: Reported on 04/03/2020 03/22/20   Fayrene Helper, PA-C     Allergies:   No Known Allergies  Social History:   Social History   Socioeconomic History  . Marital status: Single    Spouse name: Not on file  . Number of children: Not on file  . Years of education: Not on file  . Highest education level: Not on file  Occupational History  . Not on file  Tobacco Use  . Smoking status: Former Smoker    Packs/day: 0.25    Years: 20.00    Pack years: 5.00    Types: Cigarettes    Quit date: 08/14/2019    Years since quitting: 0.6  . Smokeless tobacco: Never Used  . Tobacco comment: 2 cig/ daily  Vaping Use  . Vaping Use: Never used  Substance and Sexual Activity  . Alcohol use: Not Currently    Alcohol/week: 2.0 standard drinks    Types: 1 Cans of beer, 1 Shots of liquor per week  . Drug use: Not Currently  . Sexual activity: Not on file  Other Topics Concern  . Not on file  Social History Narrative  . Not on file   Social Determinants of Health   Financial Resource Strain: Not on file  Food Insecurity: Not on file  Transportation Needs: Not on file  Physical Activity: Not on file  Stress: Not on file  Social Connections: Not on file  Intimate Partner Violence: Not on file    Family History:   The patient's family history includes Cancer in his father; Heart disease in his father.    ROS:  Please see the history of present illness.  All other ROS reviewed and negative.      Physical Exam/Data:   Vitals:   04/03/20 0713 04/03/20 0720 04/03/20 0800 04/03/20 1000  BP:  (!) 114/98 (!) 125/97 (!) 159/96  Pulse:  95 84 (!) 103  Resp:  (!) 22 (!) 27 16  Temp:  97.9 F (36.6 C)    TempSrc:  Oral    SpO2:  97% 99% 96%  Weight: 97.5 kg     Height: 5\' 11"  (1.803 m)       Intake/Output Summary (Last 24 hours) at 04/03/2020 1122 Last data filed at 04/03/2020 0751 Gross per 24 hour  Intake --  Output 800 ml  Net -800 ml   Last 3 Weights 04/03/2020 04/02/2020 02/17/2020  Weight (lbs) 215 lb 215 lb 226 lb  Weight (kg) 97.523 kg 97.523 kg 102.513 kg     Body mass index is 29.99 kg/m.  General:  Well nourished, well developed, in no acute distress HEENT: sclera anicteric Neck: + JVD Vascular: No carotid bruits; distal pulses 2+ bilaterally  Cardiac:  normal S1, S2; RRR; +murmur, no rubs, or gallops  Lungs: faint crackles at lung bases Abd: soft, nontender, no hepatomegaly  Ext: 1+ LE edema Musculoskeletal:  No deformities, BUE and BLE strength normal and equal Skin: warm and dry  Neuro:  CNs 2-12 intact, no focal abnormalities noted Psych:  Normal affect    EKG:  sinus rhythm, rate 95 bpm, PVC and PACs, isolated TWI in aVL, no STE/D; no significant change from previous  Relevant CV Studies: Echocardiogram 2018: 1. Left ventricle: The cavity size was severely dilated. Wall   thickness was increased in a pattern of mild LVH. Systolic   function was moderately reduced. The estimated ejection fraction   was in the range of 40% to 45%. Global hypokinesis. Doppler   parameters are consistent with restrictive physiology,   indicative of decreased left ventricular diastolic compliance   and/or increased left atrial pressure.  2. Mitral valve: Mildly thickened leaflets. Functional MR with   malcoaptation of the valve leaflets. Severe regurgitation   directed posteriorly. Possible systolic reversal of the   pulmonary vein flow, artifact may  be limiting accuracy.  3. Left atrium: The atrium was mildly to moderately dilated.  4. Right ventricle: The cavity size was normal. Wall thickness was   normal. Systolic function was normal.  5. Pulmonary arteries: PA peak pressure: 67mm Hg (S).  6. Inferior vena cava: The vessel was dilated. The respirophasic   diameter changes were blunted (< 50%), consistent with elevated   central venous pressure.    LHC 2018: Coronary arteries:  The coronary circulation is right dominant.  The left main trifurcates into the LAD, a ramus intermedius, and  the left circumflex. The left anterior descending gives rise to 2  diagonals. The left circumflex gives rise to 2 obtuse marginals.  The right coronary has a normal course.  Left main: Patent. Normal-sized. Mild luminal irregularities.  Distal vessel lesion: There is a 10% stenosis.  LAD: Normal-sized, TypeIII. Mild luminal irregularities.  Mid-vessel lesion: There is a 30% stenosis.  1st diagonal: Normal-sized. Mild luminal irregularities.  2nd diagonal: Patent. Small.  Ramus intermedius: Patent. Small.  Left circumflex: Normal-sized. Mild luminal irregularities.  Mid-vessel lesion: There is a 40% stenosis.  1st obtuse marginal: Patent. Moderate-sized.  2nd obtuse marginal: Large. Mild luminal irregularities.  Right coronary: Normal-sized. Mild luminal irregularities. Distal  vessel lesion: There is a 30% stenosis.  Right posterior descending: Normal-sized. Mild luminal  irregularities.  RCA posterolateral extension: Normal-sized. Mild luminal  irregularities.   Clinical Impression:   1. Moderate 40% stenosis in the mid left circumflex; mild diffuse   disease in the left and right coronary system.  2. Normal LV end diastolic pressure.   -------------------------------------------------------------------  Recommendations:   1. Continue treatment of cardiomyopathy and NSVT.  2. Medical management and risk factor  modification.    Laboratory Data:  High Sensitivity Troponin:   Recent Labs  Lab 03/22/20 0833 03/22/20 1245 04/02/20 1614 04/02/20 2312  TROPONINIHS 11 12 55* 55*      Chemistry Recent Labs  Lab 04/02/20 1614  NA 140  K 4.3  CL 111  CO2 22  GLUCOSE 112*  BUN 22*  CREATININE 1.06  CALCIUM 8.8*  GFRNONAA >60  ANIONGAP 7    No results for input(s): PROT, ALBUMIN, AST, ALT, ALKPHOS, BILITOT in the last 168 hours. Hematology Recent Labs  Lab 04/02/20 1614  WBC 11.5*  RBC 4.19*  HGB 10.7*  HCT 31.2*  MCV 74.5*  MCH 25.5*  MCHC 34.3  RDW 16.8*  PLT 315   BNP Recent Labs  Lab 04/03/20 0343  BNP 948.3*    DDimer No results for input(s): DDIMER in the last 168 hours.   Radiology/Studies:  DG Chest 2 View  Result Date: 04/02/2020 CLINICAL DATA:  Shortness of breath x1 day EXAM: CHEST - 2 VIEW COMPARISON:  Chest radiograph March 22, 2020 FINDINGS: Cardiomegaly, similar prior. Central vascular congestion. Similar hazy bilateral interstitial and airspace opacities. No pleural effusion. No pneumothorax. The visualized skeletal structures are unremarkable. IMPRESSION: Stable cardiomegaly and central vascular congestion. Similar hazy bilateral interstitial and airspace opacities most consistent with pulmonary edema. Electronically Signed   By: Maudry Mayhew MD   On: 04/02/2020 16:33     Assessment and Plan:   1. Acute on chronic combined CHF: patient presents with progressive DOE, orthopnea, and abdominal bloating for the past month. Seen 03/22/20 with similar complaints and prescribed a short course of po lasix. Unfortunately symptoms continued to decline. BNP 948. CXR with pulmonary vascular congestion/edema. He was given IV lasix 20mg  in the ED with UOP - so far. He has a known history of combined CHF with EF 40-45% in 2018 with restrictive physiology, felt to be non-ischemic after LHC revealed mild-moderate non-obstructive CAD. He has not been on GDMT for at  least 3-4 months.  - Will update an echocardiogram to re-evaluate LV function and valvular abnormalities - Will start IV lasix 40mg  BID - Continue to monitor strict I&Os and daily weights - Continue to monitor electrolytes closely and replete as needed to maintain K >4, Mg >2 - Anticipate starting GDMT with BBlocker and hopefully entresto prior to discharge  2. Elevated troponin in patient with non-obstructive CAD: patient with mild-moderate LM, LAD, LCx, and RCA disease on cath in 2018. HsTrop 55 x2 this admission - not c/w ACS.  - Will check FLP/A1C for risk startification - Will restart aspirin and statin - Will update an echocardiogram to re-evaluate LV function as above  3. Mitral regurgitation: noted to have severe functional MR on echo in 2018. No reassessment since that time.  - Will follow-up on echo this admission  4. HTN: BP initially elevated, though soft at times. He has been off medications for 3-4 months.  - Will monitor closely with ongoing efforts to diurese - Anticipate starting GDMT for #1 in the coming days  5. HLD: no recent lipids on file - Will check FLP in AM - Will start atorvastatin 80mg  daily given #2  6. DM type 2: no recent A1C, though up to 6.3 in 2020.  - Will check A1C - May benefit from addition of SGLT2-inhibitor if Cr remains stable with management of #1  7. Recent COVID-19: diagnosed 02/17/20 and completed 3 doses of remdesivir. Tested negative this admission. CXR without PNA.  - Continue to monitor  8. Leukocytosis: WBC mildly elevated at 11.5 this morning. No significant infectious complaints. He is afebrile - Continue to monitor WBC and fever trend  9 Anemia: Hgb 10.7 this admission, down from 12.7 last month. No complaints of bleeding.  - Will check anemia panel   10. Substance abuse: reports abstinence from tobacco and cocaine over the past 3-4 months - Will check Utox - Continue to encourage abstinence   Risk Assessment/Risk Scores:         New York Heart Association (NYHA) Functional Class NYHA Class III     Severity of Illness: The appropriate patient status for this patient is  OBSERVATION. Observation status is judged to be reasonable and necessary in order to provide the required intensity of service to ensure the patient's safety. The patient's presenting symptoms, physical exam findings, and initial radiographic and laboratory data in the context of their medical condition is felt to place them at decreased risk for further clinical deterioration. Furthermore, it is anticipated that the patient will be medically stable for discharge from the hospital within 2 midnights of admission. The following factors support the patient status of observation.   " The patient's presenting symptoms include SOB, DOE, orthopnea, PND, and abdominal bloating. " The physical exam findings include mild JVD and faint crackles on pulmonary exam. " The initial radiographic and laboratory data are pulmonary edema on CXR.     For questions or updates, please contact [facility] Please consult [email or web address] for contact info under     Signed, Beatriz Stallion, PA-C  04/03/2020 11:22 AM

## 2020-04-03 NOTE — Progress Notes (Signed)
°  Echocardiogram 2D Echocardiogram has been performed.  Devon Silva 04/03/2020, 1:54 PM

## 2020-04-04 ENCOUNTER — Other Ambulatory Visit: Payer: Self-pay | Admitting: Cardiology

## 2020-04-04 ENCOUNTER — Other Ambulatory Visit (HOSPITAL_COMMUNITY): Payer: Self-pay | Admitting: Cardiology

## 2020-04-04 ENCOUNTER — Ambulatory Visit: Payer: Medicaid Other | Admitting: Cardiovascular Disease

## 2020-04-04 DIAGNOSIS — F1910 Other psychoactive substance abuse, uncomplicated: Secondary | ICD-10-CM

## 2020-04-04 DIAGNOSIS — D72829 Elevated white blood cell count, unspecified: Secondary | ICD-10-CM

## 2020-04-04 DIAGNOSIS — I50810 Right heart failure, unspecified: Secondary | ICD-10-CM

## 2020-04-04 DIAGNOSIS — I509 Heart failure, unspecified: Secondary | ICD-10-CM

## 2020-04-04 LAB — IRON AND TIBC
Iron: 35 ug/dL — ABNORMAL LOW (ref 45–182)
Saturation Ratios: 8 % — ABNORMAL LOW (ref 17.9–39.5)
TIBC: 437 ug/dL (ref 250–450)
UIBC: 402 ug/dL

## 2020-04-04 LAB — CBC WITH DIFFERENTIAL/PLATELET
Abs Immature Granulocytes: 0.06 10*3/uL (ref 0.00–0.07)
Basophils Absolute: 0.1 10*3/uL (ref 0.0–0.1)
Basophils Relative: 1 %
Eosinophils Absolute: 0.1 10*3/uL (ref 0.0–0.5)
Eosinophils Relative: 1 %
HCT: 33 % — ABNORMAL LOW (ref 39.0–52.0)
Hemoglobin: 11.3 g/dL — ABNORMAL LOW (ref 13.0–17.0)
Immature Granulocytes: 1 %
Lymphocytes Relative: 15 %
Lymphs Abs: 2 10*3/uL (ref 0.7–4.0)
MCH: 25.3 pg — ABNORMAL LOW (ref 26.0–34.0)
MCHC: 34.2 g/dL (ref 30.0–36.0)
MCV: 73.8 fL — ABNORMAL LOW (ref 80.0–100.0)
Monocytes Absolute: 1.3 10*3/uL — ABNORMAL HIGH (ref 0.1–1.0)
Monocytes Relative: 10 %
Neutro Abs: 9.7 10*3/uL — ABNORMAL HIGH (ref 1.7–7.7)
Neutrophils Relative %: 72 %
Platelets: 376 10*3/uL (ref 150–400)
RBC: 4.47 MIL/uL (ref 4.22–5.81)
RDW: 17 % — ABNORMAL HIGH (ref 11.5–15.5)
WBC: 13.2 10*3/uL — ABNORMAL HIGH (ref 4.0–10.5)
nRBC: 0 % (ref 0.0–0.2)

## 2020-04-04 LAB — BASIC METABOLIC PANEL
Anion gap: 14 (ref 5–15)
BUN: 23 mg/dL — ABNORMAL HIGH (ref 6–20)
CO2: 24 mmol/L (ref 22–32)
Calcium: 9.7 mg/dL (ref 8.9–10.3)
Chloride: 103 mmol/L (ref 98–111)
Creatinine, Ser: 1.17 mg/dL (ref 0.61–1.24)
GFR, Estimated: >60 mL/min (ref >60)
Glucose, Bld: 99 mg/dL (ref 70–99)
Potassium: 3.8 mmol/L (ref 3.5–5.1)
Sodium: 141 mmol/L (ref 135–145)

## 2020-04-04 LAB — RETICULOCYTES
Immature Retic Fract: 36.5 % — ABNORMAL HIGH (ref 2.3–15.9)
RBC.: 4.6 MIL/uL (ref 4.22–5.81)
Retic Count, Absolute: 157.8 10*3/uL (ref 19.0–186.0)
Retic Ct Pct: 3.4 % — ABNORMAL HIGH (ref 0.4–3.1)

## 2020-04-04 LAB — CBG MONITORING, ED
Glucose-Capillary: 112 mg/dL — ABNORMAL HIGH (ref 70–99)
Glucose-Capillary: 144 mg/dL — ABNORMAL HIGH (ref 70–99)

## 2020-04-04 LAB — HEMOGLOBIN A1C
Hgb A1c MFr Bld: 5.7 % — ABNORMAL HIGH (ref 4.8–5.6)
Mean Plasma Glucose: 116.89 mg/dL

## 2020-04-04 LAB — LIPID PANEL
Cholesterol: 101 mg/dL (ref 0–200)
HDL: 17 mg/dL — ABNORMAL LOW (ref >40)
LDL Cholesterol: 73 mg/dL (ref 0–99)
Total CHOL/HDL Ratio: 5.9 RATIO
Triglycerides: 56 mg/dL (ref <150)
VLDL: 11 mg/dL (ref 0–40)

## 2020-04-04 LAB — VITAMIN B12: Vitamin B-12: 1360 pg/mL — ABNORMAL HIGH (ref 180–914)

## 2020-04-04 LAB — FOLATE: Folate: 20.3 ng/mL (ref >5.9)

## 2020-04-04 LAB — FERRITIN: Ferritin: 55 ng/mL (ref 24–336)

## 2020-04-04 LAB — MAGNESIUM: Magnesium: 2.1 mg/dL (ref 1.7–2.4)

## 2020-04-04 MED ORDER — ASPIRIN 81 MG PO TBEC: 81.0000 mg | DELAYED_RELEASE_TABLET | Freq: Every day | ORAL | 2 refills | Status: DC

## 2020-04-04 MED ORDER — DAPAGLIFLOZIN PROPANEDIOL 10 MG PO TABS
10.0000 mg | ORAL_TABLET | Freq: Every day | ORAL | Status: DC
  Administered 2020-04-04: 10 mg via ORAL
  Filled 2020-04-04: qty 1

## 2020-04-04 MED ORDER — DAPAGLIFLOZIN PROPANEDIOL 10 MG PO TABS: 10.0000 mg | ORAL_TABLET | Freq: Every day | ORAL | 2 refills | Status: DC

## 2020-04-04 MED ORDER — CARVEDILOL 6.25 MG PO TABS: 6.2500 mg | ORAL_TABLET | Freq: Two times a day (BID) | ORAL | 2 refills | Status: DC

## 2020-04-04 MED ORDER — LOSARTAN POTASSIUM 25 MG PO TABS: 25.0000 mg | ORAL_TABLET | Freq: Every day | ORAL | 2 refills | Status: DC

## 2020-04-04 MED ORDER — ATORVASTATIN CALCIUM 80 MG PO TABS: 80.0000 mg | ORAL_TABLET | Freq: Every day | ORAL | 2 refills | Status: DC

## 2020-04-04 MED ORDER — LOSARTAN POTASSIUM 50 MG PO TABS
25.0000 mg | ORAL_TABLET | Freq: Every day | ORAL | Status: DC
  Administered 2020-04-04: 25 mg via ORAL
  Filled 2020-04-04: qty 1

## 2020-04-04 MED FILL — FARXIGA 10 MG TABLET: 10 | 30 days supply | Qty: 30 | Fill #0

## 2020-04-04 MED FILL — LOSARTAN POTASSIUM 25 MG TA: 25 | 30 days supply | Qty: 30 | Fill #0

## 2020-04-04 MED FILL — ASPIRIN LOW DOSE 81 MG TBEC: 81 | 90 days supply | Qty: 90 | Fill #0

## 2020-04-04 MED FILL — CARVEDILOL 6.25 MG TABLET: 6.25 | 30 days supply | Qty: 60 | Fill #0

## 2020-04-04 MED FILL — ATORVASTATIN CALCIUM 80 MG: 80 | 30 days supply | Qty: 30 | Fill #0

## 2020-04-04 NOTE — Progress Notes (Signed)
Heart Failure Navigator Progress Note  HV TOC appt scheduled for Friday 2/25 @ 3pm for close hospital follow up.  Unable to complete screening d/t quick DC.  TOC f/u -inadequate insurance (medicaid family planning) -medication regimen/optimization  Ozella Rocks, RN, BSN Heart Failure Nurse Navigator [phone]

## 2020-04-04 NOTE — Progress Notes (Signed)
Progress Note  Patient Name: Devon Silva Date of Encounter: 04/04/2020  Primary Cardiologist: Dr. Duke Salvia, MD   Subjective   Improved today, no specific complaints. CTA neg for PE   Inpatient Medications    Scheduled Meds: . aspirin EC  81 mg Oral Daily  . atorvastatin  80 mg Oral QHS  . carvedilol  6.25 mg Oral BID WC  . enoxaparin (LOVENOX) injection  40 mg Subcutaneous Q24H  . furosemide  40 mg Oral Daily  . insulin aspart  0-5 Units Subcutaneous QHS  . insulin aspart  0-9 Units Subcutaneous TID WC  . sodium chloride flush  3 mL Intravenous Q12H   Continuous Infusions: . sodium chloride     PRN Meds: sodium chloride, acetaminophen, ondansetron (ZOFRAN) IV, sodium chloride flush   Vital Signs    Vitals:   04/04/20 0300 04/04/20 0430 04/04/20 0530 04/04/20 0845  BP: (!) 116/104 (!) 132/92 (!) 128/103 (!) 123/96  Pulse: 91 81 83 93  Resp: (!) 22 (!) 32 (!) 25 (!) 26  Temp:    98.7 F (37.1 C)  TempSrc:    Oral  SpO2: 97% 96% 96% 93%  Weight:      Height:        Intake/Output Summary (Last 24 hours) at 04/04/2020 0936 Last data filed at 04/04/2020 0031 Gross per 24 hour  Intake 3 ml  Output 2300 ml  Net -2297 ml   Filed Weights   04/02/20 1617 04/03/20 0713  Weight: 97.5 kg 97.5 kg    Physical Exam   General: Well developed, well nourished, NAD Neck: Negative for carotid bruits. No JVD Lungs:Clear to ausculation bilaterally. Breathing is unlabored. Cardiovascular: RRR with S1 S2. No murmurs Abdomen: Soft, non-tender, non-distended. No obvious abdominal masses. Extremities: No edema. No clubbing or cyanosis. DP/PT pulses 2+ bilaterally Neuro: Alert and oriented. No focal deficits. No facial asymmetry. MAE spontaneously. Psych: Responds to questions appropriately with normal affect.   Labs    Chemistry Recent Labs  Lab 04/02/20 1614 04/03/20 1345 04/04/20 0401  NA 140  --  141  K 4.3  --  3.8  CL 111  --  103  CO2 22  --  24   GLUCOSE 112*  --  99  BUN 22*  --  23*  CREATININE 1.06 1.19 1.17  CALCIUM 8.8*  --  9.7  GFRNONAA >60 >60 >60  ANIONGAP 7  --  14     Hematology Recent Labs  Lab 04/02/20 1614 04/03/20 1345 04/04/20 0401  WBC 11.5* 11.4* 13.2*  RBC 4.19* 4.38 4.47  4.60  HGB 10.7* 11.0* 11.3*  HCT 31.2* 32.7* 33.0*  MCV 74.5* 74.7* 73.8*  MCH 25.5* 25.1* 25.3*  MCHC 34.3 33.6 34.2  RDW 16.8* 16.9* 17.0*  PLT 315 341 376    Cardiac EnzymesNo results for input(s): TROPONINI in the last 168 hours. No results for input(s): TROPIPOC in the last 168 hours.   BNP Recent Labs  Lab 04/03/20 0343  BNP 948.3*     DDimer No results for input(s): DDIMER in the last 168 hours.   Radiology    DG Chest 2 View  Result Date: 04/02/2020 CLINICAL DATA:  Shortness of breath x1 day EXAM: CHEST - 2 VIEW COMPARISON:  Chest radiograph March 22, 2020 FINDINGS: Cardiomegaly, similar prior. Central vascular congestion. Similar hazy bilateral interstitial and airspace opacities. No pleural effusion. No pneumothorax. The visualized skeletal structures are unremarkable. IMPRESSION: Stable cardiomegaly and central vascular congestion. Similar  hazy bilateral interstitial and airspace opacities most consistent with pulmonary edema. Electronically Signed   By: Maudry Mayhew MD   On: 04/02/2020 16:33   CT ANGIO CHEST PE W OR WO CONTRAST  Result Date: 04/03/2020 CLINICAL DATA:  Pleuritic chest pain. EXAM: CT ANGIOGRAPHY CHEST WITH CONTRAST TECHNIQUE: Multidetector CT imaging of the chest was performed using the standard protocol during bolus administration of intravenous contrast. Multiplanar CT image reconstructions and MIPs were obtained to evaluate the vascular anatomy. CONTRAST:  31mL OMNIPAQUE IOHEXOL 350 MG/ML SOLN COMPARISON:  None. FINDINGS: Cardiovascular: The thoracic aorta is normal in appearance, without evidence of aneurysmal dilatation. Satisfactory opacification of the pulmonary arteries to the  segmental level. No evidence of pulmonary embolism. There is moderate severity cardiomegaly. No pericardial effusion. Mediastinum/Nodes: There is mild mediastinal and bilateral hilar lymphadenopathy. Thyroid gland, trachea, and esophagus demonstrate no significant findings. Lungs/Pleura: Small subpleural cysts are seen involving the bilateral apices. Mild areas of atelectasis and/or early infiltrate are seen within the bilateral lower lobes and along the periphery of the right upper lobe. A trace amount of pleural fluid is seen on the right. There is no evidence of a pneumothorax. Upper Abdomen: There is a small hiatal hernia. Musculoskeletal: No chest wall abnormality. No acute or significant osseous findings. Review of the MIP images confirms the above findings. IMPRESSION: 1. No evidence of pulmonary embolism. 2. Mild right upper lobe and bilateral lower lobe atelectasis and/or early infiltrate. 3. A very small right pleural effusion. 4. Moderate severity cardiomegaly. 5. Small hiatal hernia. Electronically Signed   By: Aram Candela M.D.   On: 04/03/2020 20:49   ECHOCARDIOGRAM COMPLETE  Result Date: 04/03/2020    ECHOCARDIOGRAM REPORT   Patient Name:   Devon Silva Date of Exam: 04/03/2020 Medical Rec #:  [id number]       Height:       71.0 in Accession #:    [id number]      Weight:       215.0 lb Date of Birth:  10/12/1960       BSA:          2.174 m Patient Age:    59 years        BP:           159/96 mmHg Patient Gender: M               HR:           89 bpm. Exam Location:  Inpatient Procedure: 2D Echo, Cardiac Doppler and Color Doppler Indications:    Dyspnea  History:        Patient has no prior history of Echocardiogram examinations.                 Risk Factors:Hypertension.  Sonographer:    Eulah Pont RDCS Referring Phys: [id number] Beatriz Stallion IMPRESSIONS  1. Left ventricular ejection fraction, by estimation, is 45 to 50%. The left ventricle has mildly decreased function. The left  ventricle demonstrates global hypokinesis. The left ventricular internal cavity size was moderately dilated. Left ventricular diastolic parameters are consistent with Grade II diastolic dysfunction (pseudonormalization). Elevated left ventricular end-diastolic pressure. There is the interventricular septum is flattened in systole and diastole, consistent with right ventricular pressure and volume overload.  2. Right ventricular systolic function is normal. The right ventricular size is severely enlarged. There is severely elevated pulmonary artery systolic pressure.  3. Left atrial size was moderately dilated.  4. Right atrial size was severely dilated.  5. The mitral valve is abnormal. Moderate to severe mitral valve regurgitation. No evidence of mitral stenosis.  6. Tricuspid valve regurgitation is moderate.  7. The aortic valve has an indeterminant number of cusps. Aortic valve regurgitation is not visualized.  8. Severely dilated pulmonary artery.  9. The inferior vena cava is dilated in size with <50% respiratory variability, suggesting right atrial pressure of 15 mmHg. Comparison(s): Prior images unable to be directly viewed, comparison made by report only. Conclusion(s)/Recommendation(s): Mildly/globally reduced LVEF. MR appears at least moderate and likely severe. Severe pulmonary hypertension with dilated RA/RV. Findings communicated with Dr. Duke Salviaandolph and Judy PimpleKrista Kroeger, PA. FINDINGS  Left Ventricle: Left ventricular ejection fraction, by estimation, is 45 to 50%. The left ventricle has mildly decreased function. The left ventricle demonstrates global hypokinesis. The left ventricular internal cavity size was moderately dilated. There is no left ventricular hypertrophy. The interventricular septum is flattened in systole and diastole, consistent with right ventricular pressure and volume overload. Left ventricular diastolic parameters are consistent with Grade II diastolic dysfunction (pseudonormalization).  Elevated left ventricular end-diastolic pressure. Right Ventricle: The right ventricular size is severely enlarged. Right vetricular wall thickness was not well visualized. Right ventricular systolic function is normal. There is severely elevated pulmonary artery systolic pressure. The tricuspid regurgitant velocity is 3.85 m/s, and with an assumed right atrial pressure of 15 mmHg, the estimated right ventricular systolic pressure is 74.3 mmHg. Left Atrium: Left atrial size was moderately dilated. Right Atrium: Right atrial size was severely dilated. Pericardium: There is no evidence of pericardial effusion. Mitral Valve: The mitral valve is abnormal. There is mild thickening of the mitral valve leaflet(s). There is moderate calcification of the mitral valve leaflet(s). Moderate to severe mitral valve regurgitation. No evidence of mitral valve stenosis. Tricuspid Valve: The tricuspid valve is normal in structure. Tricuspid valve regurgitation is moderate . No evidence of tricuspid stenosis. Aortic Valve: The aortic valve has an indeterminant number of cusps. Aortic valve regurgitation is not visualized. Pulmonic Valve: The pulmonic valve was grossly normal. Pulmonic valve regurgitation is trivial. Aorta: The aortic root, ascending aorta, aortic arch and descending aorta are all structurally normal, with no evidence of dilitation or obstruction. Pulmonary Artery: The pulmonary artery is severely dilated. Venous: The inferior vena cava is dilated in size with less than 50% respiratory variability, suggesting right atrial pressure of 15 mmHg. IAS/Shunts: The atrial septum is grossly normal.  LEFT VENTRICLE PLAX 2D LVIDd:         6.10 cm      Diastology LVIDs:         4.80 cm      LV e' medial:    7.01 cm/s LV PW:         0.90 cm      LV E/e' medial:  26.4 LV IVS:        0.80 cm      LV e' lateral:   6.48 cm/s LVOT diam:     1.90 cm      LV E/e' lateral: 28.5 LV SV:         57 LV SV Index:   26 LVOT Area:     2.84 cm   LV Volumes (MOD) LV vol d, MOD A2C: 210.0 ml LV vol d, MOD A4C: 158.0 ml LV vol s, MOD A2C: 114.0 ml LV vol s, MOD A4C: 79.4 ml LV SV MOD A2C:     96.0 ml LV SV MOD A4C:     158.0 ml LV SV  MOD BP:      91.1 ml RIGHT VENTRICLE RV Basal diam:  5.60 cm RV S prime:     9.46 cm/s TAPSE (M-mode): 2.2 cm LEFT ATRIUM             Index       RIGHT ATRIUM           Index LA diam:        5.60 cm 2.58 cm/m  RA Area:     34.90 cm LA Vol (A2C):   77.5 ml 35.64 ml/m RA Volume:   141.00 ml 64.85 ml/m LA Vol (A4C):   69.1 ml 31.78 ml/m LA Biplane Vol: 79.6 ml 36.61 ml/m  AORTIC VALVE LVOT Vmax:   137.33 cm/s LVOT Vmean:  91.600 cm/s LVOT VTI:    0.200 m  AORTA Ao Root diam: 3.10 cm Ao Asc diam:  2.70 cm MITRAL VALVE                 TRICUSPID VALVE MV Area (PHT): 3.12 cm      TR Peak grad:   59.3 mmHg MV Decel Time: 243 msec      TR Vmax:        385.00 cm/s MR Peak grad:    117.1 mmHg MR Mean grad:    78.0 mmHg   SHUNTS MR Vmax:         541.00 cm/s Systemic VTI:  0.20 m MR Vmean:        423.0 cm/s  Systemic Diam: 1.90 cm MR PISA:         3.08 cm MR PISA Eff ROA: 19 mm MR PISA Radius:  0.70 cm MV E velocity: 185.00 cm/s MV A velocity: 128.00 cm/s MV E/A ratio:  1.45 Jodelle Red MD Electronically signed by Jodelle Red MD Signature Date/Time: 04/03/2020/4:58:30 PM    Final    Telemetry    04/04/20 NSR with HR in the low 90's- Personally Reviewed  ECG    No new tracing as of 04/04/20- Personally Reviewed  Cardiac Studies   Echocardiogram 2018: 1. Left ventricle: The cavity size was severely dilated. Wall   thickness was increased in a pattern of mild LVH. Systolic   function was moderately reduced. The estimated ejection fraction   was in the range of 40% to 45%. Global hypokinesis. Doppler   parameters are consistent with restrictive physiology,   indicative of decreased left ventricular diastolic compliance   and/or increased left atrial pressure.  2. Mitral valve: Mildly  thickened leaflets. Functional MR with   malcoaptation of the valve leaflets. Severe regurgitation   directed posteriorly. Possible systolic reversal of the   pulmonary vein flow, artifact may be limiting accuracy.  3. Left atrium: The atrium was mildly to moderately dilated.  4. Right ventricle: The cavity size was normal. Wall thickness was   normal. Systolic function was normal.  5. Pulmonary arteries: PA peak pressure: 86mm Hg (S).  6. Inferior vena cava: The vessel was dilated. The respirophasic   diameter changes were blunted (< 50%), consistent with elevated   central venous pressure.    LHC 2018: Coronary arteries:  The coronary circulation is right dominant.  The left main trifurcates into the LAD, a ramus intermedius, and  the left circumflex. The left anterior descending gives rise to 2  diagonals. The left circumflex gives rise to 2 obtuse marginals.  The right coronary has a normal course.  Left main: Patent. Normal-sized. Mild luminal irregularities.  Distal vessel lesion:  There is a 10% stenosis.  LAD: Normal-sized, TypeIII. Mild luminal irregularities.  Mid-vessel lesion: There is a 30% stenosis.  1st diagonal: Normal-sized. Mild luminal irregularities.  2nd diagonal: Patent. Small.  Ramus intermedius: Patent. Small.  Left circumflex: Normal-sized. Mild luminal irregularities.  Mid-vessel lesion: There is a 40% stenosis.  1st obtuse marginal: Patent. Moderate-sized.  2nd obtuse marginal: Large. Mild luminal irregularities.  Right coronary: Normal-sized. Mild luminal irregularities. Distal  vessel lesion: There is a 30% stenosis.  Right posterior descending: Normal-sized. Mild luminal  irregularities.  RCA posterolateral extension: Normal-sized. Mild luminal  irregularities.   Clinical Impression:   1. Moderate 40% stenosis in the mid left circumflex; mild diffuse   disease in the left and right coronary system.  2. Normal LV end  diastolic pressure.   -------------------------------------------------------------------  Recommendations:   1. Continue treatment of cardiomyopathy and NSVT.  2. Medical management and risk factor modification.   Patient Profile     60 y.o. 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  Assessment & Plan    1. Acute on chronic combined CHF:  -Pt presented with progressive DOE with a BNP at 948 and CXR with pulmonary vascular congestion/edema. He was treated with IV lasix 20mg  good response. He has a known history of combined CHF with EF 40-45% in 2018 with restrictive physiology, felt to be non-ischemic after LHC revealed mild-moderate non-obstructive CAD. He has not been on GDMT for at least 3-4 months.  -Echocardiogram this admission showed an LVEF 45-50% with global hypokinesis and grade 2 diastolic dysfunction with severe RV failure and dilation and severe pulmonary hypertension. -CTA 04/03/20 negative for PE -Transision to PO Lasix 40mg  QD -Farxiga added to regimen>>can get free through HF TOC pharm -Creatinine at 1.17 today therefore will start low dose losartan  -Weight, 215lb  -I&O, net neg 3L  2. Elevated troponin in patient with non-obstructive CAD: -Has known hx of CAD with mild-moderate LM, LAD, LCx, and RCA disease per LHC in 2018 -HsTrop 55 x2 this admission>> not c/w ACS  3. Mitral regurgitation:  -Noted to have severe functional MR on echo in 2018 -Echo this admission with moderate to severe MR   4. HTN:  -BP stable today, 123/96>>128/103>>132/92 -Add losartan today 25mg  QD  -Continue carvedilol 6.25mg  BID   5. HLD:  -LDL this admission at 73 -Continue atorvastatin 80mg  daily   6. DM type 2:  -HbA1c, stable at 5.7 -Farxiga added to regimen>>can get through HF TOC pharm   7. Recent COVID-19:  -Diagnosed 02/17/20 and  completed 3 doses of remdesivir.  -Tested negative this admission. CXR without PNA.  -Continue to monitor  8. Leukocytosis:  -WBC mildly elevated at 11.5>>>13.2 today -Unclear etiology>>to discuss with MD  -No fever   9 Anemia:  -Hgb 10.7>>11.5 this admission, down from 12.7 last month  10. Substance abuse:  -Reports abstinence from tobacco and cocaine over the past 3-4 months -UDS negative    Signed, 2019 NP-C [facility] Pager: [phone] 04/04/2020, 9:36 AM     For questions or updates, please contact   Please consult [email or web address] for contact info under Cardiology/STEMI.

## 2020-04-04 NOTE — ED Notes (Signed)
Tele Breakfast Order Placed 

## 2020-04-04 NOTE — Progress Notes (Signed)
Heart Failure Stewardship Pharmacist Progress Note   PCP: Jacquelin Hawking, PA-C PCP-Cardiologist: No primary care provider on file.    HPI:  60 yo M with PMH of non-obstructive CAD, NICM, HTN, HLD, T2DM, CKD II/III, tobacco abuse and cocaine abuse. He presented to the ED on 04/02/20 with shortness of breath. He is being treated for acute on chronic combined CHF. An ECHO was done on 04/04/20 and LVEF was 45-50% (was 31% on stress test in June 2018) with severe RV failure and severe pulmonary hypertension.   Current HF Medications: Furosemide 40 mg daily Carvedilol 6.25 mg BID  Prior to admission HF Medications: None  Pertinent Lab Values: . Serum creatinine 1.17, BUN 23, Potassium 3.8, Sodium 141, BNP 948.3, Magnesium 2.1   Vital Signs: . Weight: 215 lbs (admission weight: 215 lbs) . Blood pressure: 130/90s  . Heart rate: 80s   Medication Assistance / Insurance Benefits Check: Does the patient have prescription insurance?  No - only has [employer] Medicaid  Does the patient qualify for medication assistance through manufacturers or grants?   Yes . Eligible grants and/or patient assistance programs: [place] . Medication assistance applications in progress: none  . Medication assistance applications approved: none Approved medication assistance renewals will be completed by: pending   Outpatient Pharmacy:  Prior to admission outpatient pharmacy: [facility] Is the patient willing to use [facility] TOC pharmacy at discharge? Yes Is the patient willing to transition their outpatient pharmacy to utilize a [facility] outpatient pharmacy?   Pending    Assessment: 1. Acute on chronic recovered systolic CHF (EF 27%>74-12%), due to NICM. NYHA class II symptoms. - Agree with transitioning to furosemide 40 mg PO daily - Agree with starting carvedilol 6.25 mg BID today now that he has completed IV diuretics  - Consider starting losartan today. Could optimize further to [facility] at follow up  visit. - Consider starting Farxiga 10 mg daily today   Plan: 1) Medication changes recommended at this time: - Start losartan 25 mg daily - Start Farxiga 10 mg daily  2) Patient assistance application(s): - Free 30-day card for Marcelline Deist can be utilized for first-time fill - Will enroll him in [place] patient assistance to bring monthly copay down to $0 per month  3)  Education  - Will complete prior to discharge   Sharen Hones, PharmD, BCPS Heart Failure Engineer, building services Phone [phone]

## 2020-04-04 NOTE — Discharge Summary (Signed)
Discharge Summary    Patient ID: Devon Silva MRN: [id number]; DOB: 02-Mar-1960  Admit date: 04/02/2020 Discharge date: 04/04/2020  PCP:  Jacquelin Hawking, Cordelia Poche   [facility]  Cardiologist: Dr. Duke Salvia, MD  Advanced Practice Provider:  No care team member to display  Discharge Diagnoses    Principal Problem:   Acute on chronic combined systolic and diastolic CHF (congestive heart failure) (HCC) Active Problems:   Essential hypertension   Hyperlipidemia   Tobacco use disorder   Prediabetes   Acute respiratory failure with hypoxia (HCC)   Acute congestive heart failure (HCC)   Right heart failure (HCC)   Leukocytosis   Substance abuse (HCC)  Diagnostic Studies/Procedures    Echocardiogram 04/03/20:  1. Left ventricular ejection fraction, by estimation, is 45 to 50%. The  left ventricle has mildly decreased function. The left ventricle  demonstrates global hypokinesis. The left ventricular internal cavity size  was moderately dilated. Left ventricular  diastolic parameters are consistent with Grade II diastolic dysfunction  (pseudonormalization). Elevated left ventricular end-diastolic pressure.  There is the interventricular septum is flattened in systole and diastole,  consistent with right ventricular  pressure and volume overload.  2. Right ventricular systolic function is normal. The right ventricular  size is severely enlarged. There is severely elevated pulmonary artery  systolic pressure.  3. Left atrial size was moderately dilated.  4. Right atrial size was severely dilated.  5. The mitral valve is abnormal. Moderate to severe mitral valve  regurgitation. No evidence of mitral stenosis.  6. Tricuspid valve regurgitation is moderate.  7. The aortic valve has an indeterminant number of cusps. Aortic valve  regurgitation is not visualized.  8. Severely dilated pulmonary artery.  9. The inferior vena cava is dilated in size with  <50% respiratory  variability, suggesting right atrial pressure of 15 mmHg.   History of Present Illness     Devon Silva is a 60 y.o. male with non-obstructive CAD, chronic systolic and diastolic heart failure (LVEF 40-45%), hypertension, hyperlipidemia, diabetes, CKD II, prior tobacco and cocaine abuse, and recent COVID-19 infection admitted with shortness of breath 04/03/20.  He was previously diagnosed with NICM in [place] in 2018 with subsequent LHC that showed with non-obstructive disease. He was previously stable on carvedilil, enalapril, and Bidil. Since moving to [place] he hasn't established with cardiology. He reported doing relatively well until diagnosed with COVID-19 02/2020. Since then he has had a persistent cough and shortness of breath with weight gain, orthopnea and edema. Given his symptoms, he presented to the ED for further evaluation.   Hospital Course     In the ED, CXR c/w volume overload with a BNP elevated to 948. He was seen in the ED previously and improved some with oral lasix but ran out. He was to establish care with cardiology today but was tachypneic in the office and referred to the ED.    He was diuresed with IV Lasix. Echocardiogram was repeated that showed diuresis an LVEF 45-50% with global hypokinesis and grade 2 diastolic dysfunction, severe RV failure and dilation with severe pulmonary hypertension felt to possibly be secondary to left sided heart disease or longstanding tobacco abuse. He didi undergo a CTA to r/o PE due to recent COVID and SOB that was negative. He was started on carvedilol 6.25 mg BID, losartan, Farxiga and Lasix. Renal function is stable. Discussed limiting sodium to 2g, fluids to 2L. Weigh daily and call our office if he gains 2lb in 1 day  or 5 lb in a week.  Check BMP in a week.  Track BP and HR twice daily and bring to f/u.  Continue to abstain from smoking.   TOC HF pharmacy involved in care for medication assistance. He will be supplied  with 30 free medications and will then be eligible for HF [facility] pharmacy as above.   Hospital concerns as below:  Acute on chronic combined CHF: -Pt presented with progressive DOE with a BNP at 948 and CXR with pulmonary vascular congestion/edema. He was treated with IV lasix 20mg  good response. He has a known history of combined CHF with EF 40-45% in 2018 with restrictive physiology, felt to be non-ischemic after LHC revealed mild-moderate non-obstructive CAD. He has not been on GDMT for at least 3-4 months.  -Echocardiogram this admission showed an LVEF 45-50% with global hypokinesis and grade 2 diastolic dysfunction with severe RV failure and dilation and severe pulmonary hypertension. -CTA 04/03/20 negative for PE -Transision to PO Lasix 40mg  QD -Farxiga added to regimen>>can get free through HF TOC pharm -Creatinine at 1.17 today therefore will start low dose losartan  -Weight, 215lb  -I&O, net neg 3L  Elevated troponin in patient with non-obstructive CAD: -Has known hx of CAD with mild-moderate LM, LAD, LCx, and RCA disease per LHC in 2018 -HsTrop 55 x2 this admission>>not c/w ACS  Mitral regurgitation: -Noted to have severe functional MR on echo in 2018 -Echo this admission with moderate to severe MR   HTN: -BP stable today, 123/96>>128/103>>132/92 -Add losartan today 25mg  QD  -Continue carvedilol 6.25mg  BID   HLD: -LDL this admission at 73 -Continue atorvastatin 80mg  daily   Pre-DM type 2: -HbA1c, stable at 5.7 -Farxiga added to regimen>>can get through HF TOC pharm   Recent COVID-19: -Diagnosed 02/17/20 and completed 3 doses of remdesivir.  -Tested negative this admission. CXR without PNA.  -Continue to monitor  Leukocytosis: -WBC mildly elevated at 11.5>>>13.2 today -Unclear etiology>>to discuss with MD  -No fever  -Follow CBC with PCP   Anemia: -Hgb 10.7>>11.5 this admission, down from 12.7 last month -Follow CBC  -Needs PCP follow up    Substance abuse: -Reports abstinence from tobacco and cocaine over the past 3-4 months -UDS negative   Consultants: None   The patient has been seen and examined by Dr. 2019 who feel that he is stable and ready for discharge today, 04/04/20.   Did the patient have an acute coronary syndrome (MI, NSTEMI, STEMI, etc) this admission?:  No                               Did the patient have a percutaneous coronary intervention (stent / angioplasty)?:  No.    ____________  Discharge Vitals Blood pressure 111/85, pulse (!) 47, temperature 98.7 F (37.1 C), temperature source Oral, resp. rate (!) 32, height 5\' 11"  (1.803 m), weight 97.5 kg, SpO2 96 %.  Filed Weights   04/02/20 1617 04/03/20 0713  Weight: 97.5 kg 97.5 kg    Labs & Radiologic Studies    CBC Recent Labs    04/03/20 1345 04/04/20 0401  WBC 11.4* 13.2*  NEUTROABS  --  9.7*  HGB 11.0* 11.3*  HCT 32.7* 33.0*  MCV 74.7* 73.8*  PLT 341 376   Basic Metabolic Panel Recent Labs    1614 04/03/20 1345 04/04/20 0401  NA 140  --  141  K 4.3  --  3.8  CL 111  --  103  CO2 22  --  24  GLUCOSE 112*  --  99  BUN 22*  --  23*  CREATININE 1.06 1.19 1.17  CALCIUM 8.8*  --  9.7  MG  --   --  2.1   Liver Function Tests No results for input(s): AST, ALT, ALKPHOS, BILITOT, PROT, ALBUMIN in the last 72 hours. No results for input(s): LIPASE, AMYLASE in the last 72 hours. High Sensitivity Troponin:   Recent Labs  Lab 03/22/20 0833 03/22/20 1245 04/02/20 1614 04/02/20 2312  TROPONINIHS 11 12 55* 55*    BNP Invalid input(s): POCBNP D-Dimer No results for input(s): DDIMER in the last 72 hours. Hemoglobin A1C Recent Labs    04/04/20 0401  HGBA1C 5.7*   Fasting Lipid Panel Recent Labs    04/04/20 0401  CHOL 101  HDL 17*  LDLCALC 73  TRIG 56  CHOLHDL 5.9   Thyroid Function Tests Recent Labs    04/03/20 1345  TSH 1.815   _____________  DG Chest 2 View  Result Date:  04/02/2020 CLINICAL DATA:  Shortness of breath x1 day EXAM: CHEST - 2 VIEW COMPARISON:  Chest radiograph March 22, 2020 FINDINGS: Cardiomegaly, similar prior. Central vascular congestion. Similar hazy bilateral interstitial and airspace opacities. No pleural effusion. No pneumothorax. The visualized skeletal structures are unremarkable. IMPRESSION: Stable cardiomegaly and central vascular congestion. Similar hazy bilateral interstitial and airspace opacities most consistent with pulmonary edema. Electronically Signed   By: Maudry MayhewJeffrey  Waltz MD   On: 04/02/2020 16:33   DG Chest 2 View  Result Date: 03/22/2020 CLINICAL DATA:  Shortness of breath and chest pain.  Cough. EXAM: CHEST - 2 VIEW COMPARISON:  February 17, 2020 FINDINGS: Ill-defined airspace opacity is noted in both mid and lower lung regions. No consolidation. There is cardiomegaly with mild pulmonary venous hypertension. No adenopathy. No bone lesions. IMPRESSION: Ill-defined airspace opacity bilaterally concerning for multifocal pneumonia. Check of COVID-19 status advised. Note that pulmonary edema could present similarly and is a differential consideration. There is cardiomegaly with a degree of pulmonary vascular congestion. No evident adenopathy. Electronically Signed   By: Bretta BangWilliam  Woodruff III M.D.   On: 03/22/2020 08:43   CT ANGIO CHEST PE W OR WO CONTRAST  Result Date: 04/03/2020 CLINICAL DATA:  Pleuritic chest pain. EXAM: CT ANGIOGRAPHY CHEST WITH CONTRAST TECHNIQUE: Multidetector CT imaging of the chest was performed using the standard protocol during bolus administration of intravenous contrast. Multiplanar CT image reconstructions and MIPs were obtained to evaluate the vascular anatomy. CONTRAST:  50mL OMNIPAQUE IOHEXOL 350 MG/ML SOLN COMPARISON:  None. FINDINGS: Cardiovascular: The thoracic aorta is normal in appearance, without evidence of aneurysmal dilatation. Satisfactory opacification of the pulmonary arteries to the segmental level.  No evidence of pulmonary embolism. There is moderate severity cardiomegaly. No pericardial effusion. Mediastinum/Nodes: There is mild mediastinal and bilateral hilar lymphadenopathy. Thyroid gland, trachea, and esophagus demonstrate no significant findings. Lungs/Pleura: Small subpleural cysts are seen involving the bilateral apices. Mild areas of atelectasis and/or early infiltrate are seen within the bilateral lower lobes and along the periphery of the right upper lobe. A trace amount of pleural fluid is seen on the right. There is no evidence of a pneumothorax. Upper Abdomen: There is a small hiatal hernia. Musculoskeletal: No chest wall abnormality. No acute or significant osseous findings. Review of the MIP images confirms the above findings. IMPRESSION: 1. No evidence of pulmonary embolism. 2. Mild right upper lobe and bilateral lower lobe atelectasis and/or early infiltrate. 3. A very  small right pleural effusion. 4. Moderate severity cardiomegaly. 5. Small hiatal hernia. Electronically Signed   By: Aram Candela M.D.   On: 04/03/2020 20:49   ECHOCARDIOGRAM COMPLETE  Result Date: 04/03/2020    ECHOCARDIOGRAM REPORT   Patient Name:   Devon Silva Date of Exam: 04/03/2020 Medical Rec #:  [id number]       Height:       71.0 in Accession #:    [id number]      Weight:       215.0 lb Date of Birth:  [birth year]       BSA:          2.174 m Patient Age:    59 years        BP:           159/96 mmHg Patient Gender: M               HR:           89 bpm. Exam Location:  Inpatient Procedure: 2D Echo, Cardiac Doppler and Color Doppler Indications:    Dyspnea  History:        Patient has no prior history of Echocardiogram examinations.                 Risk Factors:Hypertension.  Sonographer:    Eulah Pont RDCS Referring Phys: [id number] Beatriz Stallion IMPRESSIONS  1. Left ventricular ejection fraction, by estimation, is 45 to 50%. The left ventricle has mildly decreased function. The left ventricle demonstrates  global hypokinesis. The left ventricular internal cavity size was moderately dilated. Left ventricular diastolic parameters are consistent with Grade II diastolic dysfunction (pseudonormalization). Elevated left ventricular end-diastolic pressure. There is the interventricular septum is flattened in systole and diastole, consistent with right ventricular pressure and volume overload.  2. Right ventricular systolic function is normal. The right ventricular size is severely enlarged. There is severely elevated pulmonary artery systolic pressure.  3. Left atrial size was moderately dilated.  4. Right atrial size was severely dilated.  5. The mitral valve is abnormal. Moderate to severe mitral valve regurgitation. No evidence of mitral stenosis.  6. Tricuspid valve regurgitation is moderate.  7. The aortic valve has an indeterminant number of cusps. Aortic valve regurgitation is not visualized.  8. Severely dilated pulmonary artery.  9. The inferior vena cava is dilated in size with <50% respiratory variability, suggesting right atrial pressure of 15 mmHg. Comparison(s): Prior images unable to be directly viewed, comparison made by report only. Conclusion(s)/Recommendation(s): Mildly/globally reduced LVEF. MR appears at least moderate and likely severe. Severe pulmonary hypertension with dilated RA/RV. Findings communicated with Dr. Duke Salvia and Judy Pimple, PA. FINDINGS  Left Ventricle: Left ventricular ejection fraction, by estimation, is 45 to 50%. The left ventricle has mildly decreased function. The left ventricle demonstrates global hypokinesis. The left ventricular internal cavity size was moderately dilated. There is no left ventricular hypertrophy. The interventricular septum is flattened in systole and diastole, consistent with right ventricular pressure and volume overload. Left ventricular diastolic parameters are consistent with Grade II diastolic dysfunction (pseudonormalization). Elevated left  ventricular end-diastolic pressure. Right Ventricle: The right ventricular size is severely enlarged. Right vetricular wall thickness was not well visualized. Right ventricular systolic function is normal. There is severely elevated pulmonary artery systolic pressure. The tricuspid regurgitant velocity is 3.85 m/s, and with an assumed right atrial pressure of 15 mmHg, the estimated right ventricular systolic pressure is 74.3 mmHg. Left Atrium: Left atrial size was moderately dilated. Right  Atrium: Right atrial size was severely dilated. Pericardium: There is no evidence of pericardial effusion. Mitral Valve: The mitral valve is abnormal. There is mild thickening of the mitral valve leaflet(s). There is moderate calcification of the mitral valve leaflet(s). Moderate to severe mitral valve regurgitation. No evidence of mitral valve stenosis. Tricuspid Valve: The tricuspid valve is normal in structure. Tricuspid valve regurgitation is moderate . No evidence of tricuspid stenosis. Aortic Valve: The aortic valve has an indeterminant number of cusps. Aortic valve regurgitation is not visualized. Pulmonic Valve: The pulmonic valve was grossly normal. Pulmonic valve regurgitation is trivial. Aorta: The aortic root, ascending aorta, aortic arch and descending aorta are all structurally normal, with no evidence of dilitation or obstruction. Pulmonary Artery: The pulmonary artery is severely dilated. Venous: The inferior vena cava is dilated in size with less than 50% respiratory variability, suggesting right atrial pressure of 15 mmHg. IAS/Shunts: The atrial septum is grossly normal.  LEFT VENTRICLE PLAX 2D LVIDd:         6.10 cm      Diastology LVIDs:         4.80 cm      LV e' medial:    7.01 cm/s LV PW:         0.90 cm      LV E/e' medial:  26.4 LV IVS:        0.80 cm      LV e' lateral:   6.48 cm/s LVOT diam:     1.90 cm      LV E/e' lateral: 28.5 LV SV:         57 LV SV Index:   26 LVOT Area:     2.84 cm  LV Volumes  (MOD) LV vol d, MOD A2C: 210.0 ml LV vol d, MOD A4C: 158.0 ml LV vol s, MOD A2C: 114.0 ml LV vol s, MOD A4C: 79.4 ml LV SV MOD A2C:     96.0 ml LV SV MOD A4C:     158.0 ml LV SV MOD BP:      91.1 ml RIGHT VENTRICLE RV Basal diam:  5.60 cm RV S prime:     9.46 cm/s TAPSE (M-mode): 2.2 cm LEFT ATRIUM             Index       RIGHT ATRIUM           Index LA diam:        5.60 cm 2.58 cm/m  RA Area:     34.90 cm LA Vol (A2C):   77.5 ml 35.64 ml/m RA Volume:   141.00 ml 64.85 ml/m LA Vol (A4C):   69.1 ml 31.78 ml/m LA Biplane Vol: 79.6 ml 36.61 ml/m  AORTIC VALVE LVOT Vmax:   137.33 cm/s LVOT Vmean:  91.600 cm/s LVOT VTI:    0.200 m  AORTA Ao Root diam: 3.10 cm Ao Asc diam:  2.70 cm MITRAL VALVE                 TRICUSPID VALVE MV Area (PHT): 3.12 cm      TR Peak grad:   59.3 mmHg MV Decel Time: 243 msec      TR Vmax:        385.00 cm/s MR Peak grad:    117.1 mmHg MR Mean grad:    78.0 mmHg   SHUNTS MR Vmax:         541.00 cm/s Systemic VTI:  0.20 m MR Vmean:  423.0 cm/s  Systemic Diam: 1.90 cm MR PISA:         3.08 cm MR PISA Eff ROA: 19 mm MR PISA Radius:  0.70 cm MV E velocity: 185.00 cm/s MV A velocity: 128.00 cm/s MV E/A ratio:  1.45 Jodelle Red MD Electronically signed by Jodelle Red MD Signature Date/Time: 04/03/2020/4:58:30 PM    Final    Disposition   Pt is being discharged home today in good condition.  Follow-up Plans & Appointments    Follow-up Information    Abelino Derrick, PA-C Follow up on 04/17/2020.   Specialties: Cardiology, Radiology Why: at 3:45pm  Contact information: [address] [place] [address] [phone]              Discharge Instructions    Call MD for:  difficulty breathing, headache or visual disturbances   Complete by: As directed    Call MD for:  extreme fatigue   Complete by: As directed    Call MD for:  hives   Complete by: As directed    Call MD for:  persistant dizziness or light-headedness   Complete by: As  directed    Call MD for:  persistant nausea and vomiting   Complete by: As directed    Call MD for:  redness, tenderness, or signs of infection (pain, swelling, redness, odor or green/yellow discharge around incision site)   Complete by: As directed    Call MD for:  severe uncontrolled pain   Complete by: As directed    Call MD for:  temperature >100.4   Complete by: As directed    Diet - low sodium heart healthy   Complete by: As directed    Increase activity slowly   Complete by: As directed      Discharge Medications   Allergies as of 04/04/2020   No Known Allergies     Medication List    TAKE these medications   aspirin 81 MG EC tablet Take 1 tablet (81 mg total) by mouth daily. Swallow whole. Start taking on: April 05, 2020   atorvastatin 80 MG tablet Commonly known as: LIPITOR Take 1 tablet (80 mg total) by mouth at bedtime.   carvedilol 6.25 MG tablet Commonly known as: COREG Take 1 tablet (6.25 mg total) by mouth 2 (two) times daily with a meal.   dapagliflozin propanediol 10 MG Tabs tablet Commonly known as: FARXIGA Take 1 tablet (10 mg total) by mouth daily. Start taking on: April 05, 2020   furosemide 40 MG tablet Commonly known as: LASIX Take 1 tablet (40 mg total) by mouth daily.   losartan 25 MG tablet Commonly known as: COZAAR Take 1 tablet (25 mg total) by mouth daily. Start taking on: April 05, 2020       Outstanding Labs/Studies   BMET   Duration of Discharge Encounter   Greater than 30 minutes including physician time.  Signed, Georgie Chard, NP 04/04/2020, 12:58 PM

## 2020-04-04 NOTE — ED Notes (Signed)
Pt given AVS. Pt a&o x4. Pt ambulatory. Pt escorted to the front entrance.

## 2020-04-05 ENCOUNTER — Telehealth (HOSPITAL_COMMUNITY): Payer: Self-pay

## 2020-04-05 NOTE — Telephone Encounter (Signed)
Confirmed appt 2/25 @ 2pm with HV TOC. Instructed to bring all medications to appt. Informed appt may take up to 1 hour, will see HF provider, pharmacist and social worker. Gave directions, pt states he has transportation. Pt will need pill box.  Confirmed appt prior to ending phone call.   Ozella Rocks, RN, BSN Heart Failure Nurse Navigator [phone]

## 2020-04-06 ENCOUNTER — Other Ambulatory Visit: Payer: Self-pay

## 2020-04-06 ENCOUNTER — Ambulatory Visit (HOSPITAL_COMMUNITY)
Admit: 2020-04-06 | Discharge: 2020-04-06 | Disposition: A | Discharge status: Home / Self Care | Payer: Self-pay | Attending: Internal Medicine | Admitting: Internal Medicine

## 2020-04-06 ENCOUNTER — Encounter (HOSPITAL_COMMUNITY): Payer: Self-pay

## 2020-04-06 ENCOUNTER — Telehealth (HOSPITAL_COMMUNITY): Payer: Self-pay

## 2020-04-06 VITALS — BP 104/72 | HR 81 | Wt 210.2 lb

## 2020-04-06 DIAGNOSIS — Z87891 Personal history of nicotine dependence: Secondary | ICD-10-CM | POA: Insufficient documentation

## 2020-04-06 DIAGNOSIS — Z79899 Other long term (current) drug therapy: Secondary | ICD-10-CM | POA: Insufficient documentation

## 2020-04-06 DIAGNOSIS — Z5989 Other problems related to housing and economic circumstances: Secondary | ICD-10-CM | POA: Insufficient documentation

## 2020-04-06 DIAGNOSIS — R7303 Prediabetes: Secondary | ICD-10-CM

## 2020-04-06 DIAGNOSIS — Z7984 Long term (current) use of oral hypoglycemic drugs: Secondary | ICD-10-CM | POA: Insufficient documentation

## 2020-04-06 DIAGNOSIS — Z597 Insufficient social insurance and welfare support: Secondary | ICD-10-CM | POA: Insufficient documentation

## 2020-04-06 DIAGNOSIS — E785 Hyperlipidemia, unspecified: Secondary | ICD-10-CM | POA: Insufficient documentation

## 2020-04-06 DIAGNOSIS — I340 Nonrheumatic mitral (valve) insufficiency: Secondary | ICD-10-CM

## 2020-04-06 DIAGNOSIS — Z8249 Family history of ischemic heart disease and other diseases of the circulatory system: Secondary | ICD-10-CM | POA: Insufficient documentation

## 2020-04-06 DIAGNOSIS — F17200 Nicotine dependence, unspecified, uncomplicated: Secondary | ICD-10-CM

## 2020-04-06 DIAGNOSIS — I2722 Pulmonary hypertension due to left heart disease: Secondary | ICD-10-CM | POA: Insufficient documentation

## 2020-04-06 DIAGNOSIS — Z8616 Personal history of COVID-19: Secondary | ICD-10-CM | POA: Insufficient documentation

## 2020-04-06 DIAGNOSIS — I2510 Atherosclerotic heart disease of native coronary artery without angina pectoris: Secondary | ICD-10-CM | POA: Insufficient documentation

## 2020-04-06 DIAGNOSIS — D509 Iron deficiency anemia, unspecified: Secondary | ICD-10-CM | POA: Insufficient documentation

## 2020-04-06 DIAGNOSIS — I110 Hypertensive heart disease with heart failure: Secondary | ICD-10-CM | POA: Insufficient documentation

## 2020-04-06 DIAGNOSIS — F1910 Other psychoactive substance abuse, uncomplicated: Secondary | ICD-10-CM

## 2020-04-06 DIAGNOSIS — E119 Type 2 diabetes mellitus without complications: Secondary | ICD-10-CM | POA: Insufficient documentation

## 2020-04-06 DIAGNOSIS — I5042 Chronic combined systolic (congestive) and diastolic (congestive) heart failure: Secondary | ICD-10-CM | POA: Insufficient documentation

## 2020-04-06 DIAGNOSIS — R0683 Snoring: Secondary | ICD-10-CM | POA: Insufficient documentation

## 2020-04-06 DIAGNOSIS — Z7982 Long term (current) use of aspirin: Secondary | ICD-10-CM | POA: Insufficient documentation

## 2020-04-06 MED ORDER — FUROSEMIDE 40 MG PO TABS: 40.0000 mg | ORAL_TABLET | ORAL | 0 refills | Status: DC | PRN

## 2020-04-06 MED ORDER — SPIRONOLACTONE 25 MG PO TABS: 12.5000 mg | ORAL_TABLET | Freq: Every day | ORAL | 3 refills | Status: DC

## 2020-04-06 MED FILL — SPIRONOLACTONE 25 MG TABS: 25 | 30 days supply | Qty: 15 | Fill #0

## 2020-04-06 MED FILL — FUROSEMIDE 40 MG TAB: 40 | 30 days supply | Qty: 45 | Fill #0

## 2020-04-06 NOTE — Progress Notes (Signed)
Heart and Vascular Care Navigation  04/06/2020  Devon Silva 1961/02/07 [id number]  Reason for Referral: Patient seen in [facility] clinic.                                                                                                    Assessment: Patient is a 60yo male who was diagnosed with HF in 2018. Patient states he recently married his wife and have her adult disabled son who also lives with them in a single family home. Patient reports he has not worked in 2 years and his wife is currently working and her son receives a disability check. Wife states they received help from the [facility] program for back rent and are still behind close to $1,000. Patient reports he has food stamps and a pending disability application with assistance of [employer]. Patient was previously seen by [facility] although states he would like to have a PCP in the [place] area.                               HRT/VAS Care Coordination     Patients Home Cardiology Office --  HF [facility]   Outpatient Care Team Social Worker   Social Worker Name: Gae Dry [phone]   Living arrangements for the past 2 months Single Family Home   Lives with: Spouse; Adult Children   Patient Current Insurance Coverage Self-Pay   Patient Has Concern With Paying Medical Bills Yes   Does Patient Have Prescription Coverage? No       Social History:                                                                             SDOH Screenings   Alcohol Screen: Not on file  Depression (XQJ1-9): Not on file  Financial Resource Strain: High Risk  . Difficulty of Paying Living Expenses: Very hard  Food Insecurity: No Food Insecurity  . Worried About Programme researcher, broadcasting/film/video in the Last Year: Never true  . Ran Out of Food in the Last Year: Never true  Housing: Medium Risk  . Last Housing Risk Score: 1  Physical Activity: Not on file  Social Connections: Not on file  Stress: Not on file  Tobacco Use: Medium Risk  .  Smoking Tobacco Use: Former Smoker  . Smokeless Tobacco Use: Never Used  Transportation Needs: Unmet Transportation Needs  . Lack of Transportation (Medical): Yes  . Lack of Transportation (Non-Medical): No    SDOH Interventions: Financial Resources:  Financial Strain Interventions: Other (Comment) (Patient Care Fund support) Surveyor, quantity Counseling for [employer] and will explore financial options for rent assistance  Food Insecurity:  Food Insecurity Interventions: Intervention Not Indicated Patient has food stamps  Housing Insecurity:     Transportation:   Transportation Interventions: Other (Comment) (Patient will have family assist when needed.)    Follow-up plan:  CSW discussed community resources and options for possible assistance with rent/ultility bill. Patient's wife will return with lease document for further discussion and possible assistance through Patient Care fund. CSW provided patient with list of PCP to make an appointment. Patient referred to [facility] for application. Patient and wife verbalizes understanding and will follow up with the above resources. CSW available as needed. Lasandra Beech, LCSW, CCSW-MCS [phone]

## 2020-04-06 NOTE — Progress Notes (Addendum)
[facility] Transitions of Care Clinic Consult  PCP: Jacquelin Hawking Primary Cardiologist: Dr. Shari Prows  HPI:  Devon Silva is a 60 y.o.  male  with a PMH significant for non-obstructive CAD, chronic systolic and diastolic heart failure, hypertension, hyperlipidemia, diabetes, tobacco use disorder, cocaine use disorder, and recent COVID-19 infection. Referred by Dr. Shari Prows for HF [facility] evaluation post hospital discharge.   Original care took place in [facility], admitted in 2018 with new diagnosis of combined systolic and diastolic CHF.  He was diuresed, had a nuclear stress test small area of fixed defect in the mid-apical inferoseptal location felt to be low risk.  He was discharged on Bidil, carvedilol, enalapril.  A month or so later admitted with severe hypertension and went for [facility] which revealed: Moderate 40% stenosis in the mid left circumflex; mild diffuse disease in the left and right coronary system  Moved to [place] around 2019, received most of his care at [facility] free clinic.  Recently seen at [facility] ED in January 2022 and diagnosed with covid pneumonia.  He was given remdesevir and d/ced from ED without supplemental O2 based on walk test. Also referred and received mab therapy.  He was seen in the ED again on Feb 10th with volume overload was given initial lasix IV and switched to oral lasix and discharged with cardiology follow up.  Went to establish with Cardiology 10 days later but presented too symptomatic from volume overload and sent back to the ED and admitted to the cardiology service.  He was given 20mg  IV lasix, had a CT angio which was negative for PE and discharged with the addition of losartan and restarted lasix 40mg  PO daily.   he was given every medicine except lasix when he left the hospital.  Weighing himself since leaving the hospital, remains 205lbs.   Walked to the clinic today didn't have to stop, wasn't short of breath,  didn't feel tired afterwards but did feel an increase in heart rate.  Denied any chest pain, orthopnea or PND.    ROS: All systems negative except as listed in HPI, PMH and Problem List.  SH:  Social History   Socioeconomic History  . Marital status: Single    Spouse name: Not on file  . Number of children: Not on file  . Years of education: Not on file  . Highest education level: Not on file  Occupational History  . Not on file  Tobacco Use  . Smoking status: Former Smoker    Packs/day: 0.25    Years: 20.00    Pack years: 5.00    Types: Cigarettes    Quit date: 08/14/2019    Years since quitting: 0.6  . Smokeless tobacco: Never Used  . Tobacco comment: 2 cig/ daily  Vaping Use  . Vaping Use: Never used  Substance and Sexual Activity  . Alcohol use: Not Currently    Alcohol/week: 2.0 standard drinks    Types: 1 Cans of beer, 1 Shots of liquor per week  . Drug use: Not Currently  . Sexual activity: Not on file  Other Topics Concern  . Not on file  Social History Narrative  . Not on file   Social Determinants of Health   Financial Resource Strain: Not on file  Food Insecurity: Not on file  Transportation Needs: Not on file  Physical Activity: Not on file  Stress: Not on file  Social Connections: Not on file  Intimate Partner Violence: Not  on file    FH:  Family History  Problem Relation Age of Onset  . Cancer Father   . Heart disease Father     Past Medical History:  Diagnosis Date  . Heart valve problem   . Hypertension     Current Outpatient Medications  Medication Sig Dispense Refill  . aspirin EC 81 MG EC tablet Take 1 tablet (81 mg total) by mouth daily. Swallow whole. 90 tablet 2  . atorvastatin (LIPITOR) 80 MG tablet Take 1 tablet (80 mg total) by mouth at bedtime. 90 tablet 2  . carvedilol (COREG) 6.25 MG tablet Take 1 tablet (6.25 mg total) by mouth 2 (two) times daily with a meal. 120 tablet 2  . dapagliflozin propanediol (FARXIGA) 10 MG TABS  tablet Take 1 tablet (10 mg total) by mouth daily. 90 tablet 2  . losartan (COZAAR) 25 MG tablet Take 1 tablet (25 mg total) by mouth daily. 90 tablet 2   No current facility-administered medications for this encounter.    Vitals:   04/06/20 1359  BP: 104/72  Pulse: 81  SpO2: 96%  Weight: 95.3 kg (210 lb 3.2 oz)    PHYSICAL EXAM: Cardiac: JVD flat, normal rate and rhythm, clear s1 and s2, 2/6 systolic murmur, rubs or gallops, no LE edema Pulmonary: CTAB, not in distress Abdominal: non distended abdomen, soft and nontender Psych: Alert, conversant, in good spirits   ASSESSMENT & PLAN: Chronic systolic and diastolic heart failure NICM: -4332 LHC: Moderate 40% stenosis in the mid left circumflex; mild diffuse disease in the left and right coronary system.   -2018 LVEF 40-45%, Severe MR, mild TR, normal RV function, mildly elevated PA pressure, G3DD -03/2020 LVEF 40-45%, Mod-Severe MR, Moderate TR, Severe PA pressure estimate, G2DD, normal RV function by report but appears to be severely dilated and with reduced function on my interpretation -history of hypertension, substance use, off meds for several months before presenting at [facility] -NYHA Class II symptoms -Very concerned about valvular disease, MR was severe even back in 2018.   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   -continue asa, statin, bb  T2DM: -borderline diabetic -continue Farxiga  Tobacco Use Disorder:  -Has stopped for around 4 months now  Cocaine Use Disorder: -Last use 4-5 months ago, he will not start again.   Iron Deficiency Anemia: -recommend scheduling for iron infusion -referral made to PCP will need colon cancer screening  SDOH: worked on 2019, worked on medication organization and affordability   Follow up with AHF, also referral made to establish with a PCP  02-14-2002, MD  Patient seen and examined with the above-signed Patent examiner and/or Housestaff. I personally reviewed laboratory data, imaging studies and  relevant notes. I independently examined the patient and formulated the important aspects of the plan. I have edited the note to reflect any of my changes or salient points. I have personally discussed the plan with the patient and/or family.  59 y/o male with h/o HTN, DM, substance abuse and systolic Hf due to NICM. Recently discharged from the hospital after a HF flare.   Echo showed EF 40-45% with probable severe MR, severe PAH and RV strain. Feeling a bit better but still NYHA III  Snores heavily.   General:  Well appearing. No resp difficulty HEENT: normal Neck: supple. no JVD. Carotids 2+ bilat; no bruits. No lymphadenopathy or thryomegaly appreciated. Cor: PMI nondisplaced. Regular rate & rhythm. 3/6 MR Lungs: clear Abdomen: soft, nontender, nondistended. No hepatosplenomegaly. No bruits or masses. Good bowel sounds. Extremities: no cyanosis, clubbing, rash, edema Neuro: alert & orientedx3, cranial nerves grossly intact. moves all 4 extremities w/o difficulty. Affect pleasant  In reviewing his echo his MV is very abnormal and  suspect this may be cause of his HF and RV failure - although mayn also have undiagnosed OSA.   Will plan TEE and R/L cath in next 1-2 weeks to further evaluate. Continue to titrate GDMT. Arrange sleep study.   Chivonne Rascon, MD  12:22 PM   

## 2020-04-06 NOTE — Telephone Encounter (Signed)
Confirmed [facility] TOC clinic appt at 2pm today. Directions given, instructed to bring all the medications to appt.  Confirmed appt prior to ending call.   Ozella Rocks, RN, BSN Heart Failure Nurse Navigator [phone]

## 2020-04-06 NOTE — H&P (View-Only) (Signed)
[facility] Transitions of Care Clinic Consult  PCP: Jacquelin Hawking Primary Cardiologist: Dr. Shari Prows  HPI:  Devon Silva is a 60 y.o.  male  with a PMH significant for non-obstructive CAD, chronic systolic and diastolic heart failure, hypertension, hyperlipidemia, diabetes, tobacco use disorder, cocaine use disorder, and recent COVID-19 infection. Referred by Dr. Shari Prows for HF [facility] evaluation post hospital discharge.   Original care took place in [facility], admitted in 2018 with new diagnosis of combined systolic and diastolic CHF.  He was diuresed, had a nuclear stress test small area of fixed defect in the mid-apical inferoseptal location felt to be low risk.  He was discharged on Bidil, carvedilol, enalapril.  A month or so later admitted with severe hypertension and went for [facility] which revealed: Moderate 40% stenosis in the mid left circumflex; mild diffuse disease in the left and right coronary system  Moved to [place] around 2019, received most of his care at [facility] free clinic.  Recently seen at [facility] ED in January 2022 and diagnosed with covid pneumonia.  He was given remdesevir and d/ced from ED without supplemental O2 based on walk test. Also referred and received mab therapy.  He was seen in the ED again on Feb 10th with volume overload was given initial lasix IV and switched to oral lasix and discharged with cardiology follow up.  Went to establish with Cardiology 10 days later but presented too symptomatic from volume overload and sent back to the ED and admitted to the cardiology service.  He was given 20mg  IV lasix, had a CT angio which was negative for PE and discharged with the addition of losartan and restarted lasix 40mg  PO daily.   he was given every medicine except lasix when he left the hospital.  Weighing himself since leaving the hospital, remains 205lbs.   Walked to the clinic today didn't have to stop, wasn't short of breath,  didn't feel tired afterwards but did feel an increase in heart rate.  Denied any chest pain, orthopnea or PND.    ROS: All systems negative except as listed in HPI, PMH and Problem List.  SH:  Social History   Socioeconomic History  . Marital status: Single    Spouse name: Not on file  . Number of children: Not on file  . Years of education: Not on file  . Highest education level: Not on file  Occupational History  . Not on file  Tobacco Use  . Smoking status: Former Smoker    Packs/day: 0.25    Years: 20.00    Pack years: 5.00    Types: Cigarettes    Quit date: 08/14/2019    Years since quitting: 0.6  . Smokeless tobacco: Never Used  . Tobacco comment: 2 cig/ daily  Vaping Use  . Vaping Use: Never used  Substance and Sexual Activity  . Alcohol use: Not Currently    Alcohol/week: 2.0 standard drinks    Types: 1 Cans of beer, 1 Shots of liquor per week  . Drug use: Not Currently  . Sexual activity: Not on file  Other Topics Concern  . Not on file  Social History Narrative  . Not on file   Social Determinants of Health   Financial Resource Strain: Not on file  Food Insecurity: Not on file  Transportation Needs: Not on file  Physical Activity: Not on file  Stress: Not on file  Social Connections: Not on file  Intimate Partner Violence: Not  on file    FH:  Family History  Problem Relation Age of Onset  . Cancer Father   . Heart disease Father     Past Medical History:  Diagnosis Date  . Heart valve problem   . Hypertension     Current Outpatient Medications  Medication Sig Dispense Refill  . aspirin EC 81 MG EC tablet Take 1 tablet (81 mg total) by mouth daily. Swallow whole. 90 tablet 2  . atorvastatin (LIPITOR) 80 MG tablet Take 1 tablet (80 mg total) by mouth at bedtime. 90 tablet 2  . carvedilol (COREG) 6.25 MG tablet Take 1 tablet (6.25 mg total) by mouth 2 (two) times daily with a meal. 120 tablet 2  . dapagliflozin propanediol (FARXIGA) 10 MG TABS  tablet Take 1 tablet (10 mg total) by mouth daily. 90 tablet 2  . losartan (COZAAR) 25 MG tablet Take 1 tablet (25 mg total) by mouth daily. 90 tablet 2   No current facility-administered medications for this encounter.    Vitals:   04/06/20 1359  BP: 104/72  Pulse: 81  SpO2: 96%  Weight: 95.3 kg (210 lb 3.2 oz)    PHYSICAL EXAM: Cardiac: JVD flat, normal rate and rhythm, clear s1 and s2, 2/6 systolic murmur, rubs or gallops, no LE edema Pulmonary: CTAB, not in distress Abdominal: non distended abdomen, soft and nontender Psych: Alert, conversant, in good spirits   ASSESSMENT & PLAN: Chronic systolic and diastolic heart failure NICM: -4332 LHC: Moderate 40% stenosis in the mid left circumflex; mild diffuse disease in the left and right coronary system.   -2018 LVEF 40-45%, Severe MR, mild TR, normal RV function, mildly elevated PA pressure, G3DD -03/2020 LVEF 40-45%, Mod-Severe MR, Moderate TR, Severe PA pressure estimate, G2DD, normal RV function by report but appears to be severely dilated and with reduced function on my interpretation -history of hypertension, substance use, off meds for several months before presenting at [facility] -NYHA Class II symptoms -Very concerned about valvular disease, MR was severe even back in 2018.   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   -continue asa, statin, bb  T2DM: -borderline diabetic -continue Farxiga  Tobacco Use Disorder:  -Has stopped for around 4 months now  Cocaine Use Disorder: -Last use 4-5 months ago, he will not start again.   Iron Deficiency Anemia: -recommend scheduling for iron infusion -referral made to PCP will need colon cancer screening  SDOH: worked on 2019, worked on medication organization and affordability   Follow up with AHF, also referral made to establish with a PCP  02-14-2002, MD  Patient seen and examined with the above-signed Patent examiner and/or Housestaff. I personally reviewed laboratory data, imaging studies and  relevant notes. I independently examined the patient and formulated the important aspects of the plan. I have edited the note to reflect any of my changes or salient points. I have personally discussed the plan with the patient and/or family.  60 y/o male with h/o HTN, DM, substance abuse and systolic Hf due to NICM. Recently discharged from the hospital after a HF flare.   Echo showed EF 40-45% with probable severe MR, severe PAH and RV strain. Feeling a bit better but still NYHA III  Snores heavily.   General:  Well appearing. No resp difficulty HEENT: normal Neck: supple. no JVD. Carotids 2+ bilat; no bruits. No lymphadenopathy or thryomegaly appreciated. Cor: PMI nondisplaced. Regular rate & rhythm. 3/6 MR Lungs: clear Abdomen: soft, nontender, nondistended. No hepatosplenomegaly. No bruits or masses. Good bowel sounds. Extremities: no cyanosis, clubbing, rash, edema Neuro: alert & orientedx3, cranial nerves grossly intact. moves all 4 extremities w/o difficulty. Affect pleasant  In reviewing his echo his MV is very abnormal and  suspect this may be cause of his HF and RV failure - although mayn also have undiagnosed OSA.   Will plan TEE and R/L cath in next 1-2 weeks to further evaluate. Continue to titrate GDMT. Arrange sleep study.   Arvilla Meres, MD  12:22 PM

## 2020-04-06 NOTE — Patient Instructions (Addendum)
No Labs done today.  START Spironlactone 12.5mg  (1/2 tablet) by mouth daily.  No other medication changes were made. Please continue all current medications as prescribed.  Your physician recommends that you schedule a follow-up appointment in: 1 week labs and a 1 month follow up with Dr. Gala Romney.   If you have any questions or concerns before your next appointment please send [place] a message through [place] or call our office at [phone].    TO LEAVE A MESSAGE FOR THE NURSE SELECT OPTION 2, PLEASE LEAVE A MESSAGE INCLUDING: . YOUR NAME . DATE OF BIRTH . CALL BACK NUMBER . REASON FOR CALL**this is important as we prioritize the call backs  YOU WILL RECEIVE A CALL BACK THE SAME DAY AS LONG AS YOU CALL BEFORE 4:00 PM   Do the following things EVERYDAY: 1) Weigh yourself in the morning before breakfast. Write it down and keep it in a log. 2) Take your medicines as prescribed 3) Eat low salt foods-Limit salt (sodium) to 2000 mg per day.  4) Stay as active as you can everyday 5) Limit all fluids for the day to less than 2 liters   At the [facility], you and your health needs are our priority. As part of our continuing mission to provide you with exceptional heart care, we have created designated Provider Care Teams. These Care Teams include your primary Cardiologist (physician) and Advanced Practice Providers (APPs- Physician Assistants and Nurse Practitioners) who all work together to provide you with the care you need, when you need it.   You may see any of the following providers on your designated Care Team at your next follow up: Marland Kitchen Dr Arvilla Meres . Dr Marca Ancona . Tonye Becket, NP . Robbie Lis, PA . Karle Plumber, PharmD   Please be sure to bring in all your medications bottles to every appointment.    You are scheduled for a TEE and Cardiac Catheterization on Wednesday, March 9 with Dr. Arvilla Meres.  1. Please arrive at the [facility]  (Main Entrance A) at [facility]: [address] [place], [place] [address] at 7:00 AM (This time is two hours before your procedure to ensure your preparation). Free valet parking service is available.   Special note: Every effort is made to have your procedure done on time. Please understand that emergencies sometimes delay scheduled procedures.  2. Diet: Do not eat solid foods after midnight.  The patient may have clear liquids until 5am upon the day of the procedure.  3. YOU WILL NEED TO HAVE PRE PROCEDURE COVID TESTING DONE Monday MARCH 7TH, 2022 BETWEEN 8AM-2PM. YOU MUST SELF QUARANTINE UNTIL THE DAY OF YOUR PROCEDURE. OUR [facility] TESTING SITE IS LOCATED AT [address] [place] [phone]   4. Medication instructions in preparation for your procedure:  On the morning of your procedure, take your Aspirin and all other prescribed medications.  You may use sips of water.  5. Plan for one night stay--bring personal belongings. 6. Bring a current list of your medications and current insurance cards. 7. You MUST have a responsible person to drive you home. 8. Someone MUST be with you the first 24 hours after you arrive home or your discharge will be delayed. 9. Please wear clothes that are easy to get on and off and wear slip-on shoes.  Thank you for allowing [place] to care for you!   -- [facility]

## 2020-04-07 NOTE — Addendum Note (Signed)
Encounter addended by: Dolores Patty, MD on: 04/07/2020 12:25 PM  Actions taken: Level of Service modified, Visit diagnoses modified, Clinical Note Signed

## 2020-04-09 ENCOUNTER — Telehealth (HOSPITAL_COMMUNITY): Payer: Self-pay | Admitting: Pharmacist

## 2020-04-09 NOTE — Telephone Encounter (Signed)
Sent in Manufacturer's Assistance application to [employer] for Farxiga.    Application pending, will continue to follow.   Undra Harriman, PharmD, BCPS, BCCP, CPP Heart Failure Clinic Pharmacist [phone]  

## 2020-04-10 ENCOUNTER — Other Ambulatory Visit (HOSPITAL_COMMUNITY): Payer: Self-pay

## 2020-04-10 ENCOUNTER — Other Ambulatory Visit (HOSPITAL_COMMUNITY): Payer: Medicaid Other

## 2020-04-10 DIAGNOSIS — I5042 Chronic combined systolic (congestive) and diastolic (congestive) heart failure: Secondary | ICD-10-CM

## 2020-04-13 ENCOUNTER — Ambulatory Visit (HOSPITAL_COMMUNITY)
Admission: RE | Admit: 2020-04-13 | Discharge: 2020-04-13 | Disposition: A | Discharge status: Home / Self Care | Payer: Medicaid Other | Source: Ambulatory Visit | Attending: Cardiology | Admitting: Cardiology

## 2020-04-13 ENCOUNTER — Telehealth (HOSPITAL_COMMUNITY): Payer: Self-pay | Admitting: Licensed Clinical Social Worker

## 2020-04-13 ENCOUNTER — Other Ambulatory Visit: Payer: Self-pay

## 2020-04-13 DIAGNOSIS — I5022 Chronic systolic (congestive) heart failure: Secondary | ICD-10-CM | POA: Insufficient documentation

## 2020-04-13 DIAGNOSIS — R7303 Prediabetes: Secondary | ICD-10-CM | POA: Insufficient documentation

## 2020-04-13 LAB — BASIC METABOLIC PANEL
Anion gap: 10 (ref 5–15)
BUN: 28 mg/dL — ABNORMAL HIGH (ref 6–20)
CO2: 22 mmol/L (ref 22–32)
Calcium: 9.5 mg/dL (ref 8.9–10.3)
Chloride: 108 mmol/L (ref 98–111)
Creatinine, Ser: 1.32 mg/dL — ABNORMAL HIGH (ref 0.61–1.24)
GFR, Estimated: >60 mL/min (ref >60)
Glucose, Bld: 122 mg/dL — ABNORMAL HIGH (ref 70–99)
Potassium: 4.3 mmol/L (ref 3.5–5.1)
Sodium: 140 mmol/L (ref 135–145)

## 2020-04-13 LAB — CBC
HCT: 35.6 % — ABNORMAL LOW (ref 39.0–52.0)
Hemoglobin: 11.9 g/dL — ABNORMAL LOW (ref 13.0–17.0)
MCH: 23.9 pg — ABNORMAL LOW (ref 26.0–34.0)
MCHC: 33.4 g/dL (ref 30.0–36.0)
MCV: 71.5 fL — ABNORMAL LOW (ref 80.0–100.0)
Platelets: 300 10*3/uL (ref 150–400)
RBC: 4.98 MIL/uL (ref 4.22–5.81)
RDW: 17.1 % — ABNORMAL HIGH (ref 11.5–15.5)
WBC: 10.5 10*3/uL (ref 4.0–10.5)
nRBC: 0 % (ref 0.0–0.2)

## 2020-04-13 LAB — HEMOGLOBIN A1C
Hgb A1c MFr Bld: 6 % — ABNORMAL HIGH (ref 4.8–5.6)
Mean Plasma Glucose: 125.5 mg/dL

## 2020-04-13 NOTE — Telephone Encounter (Signed)
CSW received call form patient's wife who states she is faxing over an application and documentation as well as her rental information for further financial assistance. CSW will follow up with paperwork once received. Lasandra Beech, LCSW, CCSW-MCS [phone]

## 2020-04-16 ENCOUNTER — Inpatient Hospital Stay (HOSPITAL_COMMUNITY)
Admission: RE | Admit: 2020-04-16 | Discharge: 2020-04-16 | Disposition: A | Discharge status: Home / Self Care | Payer: Medicaid Other | Source: Ambulatory Visit

## 2020-04-16 NOTE — Progress Notes (Signed)
Pt not tested for covid today due to pt testing + for covid on 02/17/20. Based on the guidelines the pt is in the 90 day window to not retest. The pt is still expected to quarantine until their procedure. Therefore, the pt can still have the scheduled procedure. These are the guidelines as follows:  Guidance: Patient previously tested + COVID; now past 90 day window seeking elective surgery (asymptomatic)  Retest patient If negative, proceed with surgery If positive, postpone surgery for 10 days from positive test Patient to quarantine for the (10 days) Do not retest again prior to surgery (even if scheduled a couple of weeks out) Use standard precautions for surgery   Viviano Simas, RN

## 2020-04-17 ENCOUNTER — Ambulatory Visit: Payer: Medicaid Other | Admitting: Cardiology

## 2020-04-18 ENCOUNTER — Ambulatory Visit (HOSPITAL_COMMUNITY): Payer: Self-pay | Admitting: Anesthesiology

## 2020-04-18 ENCOUNTER — Ambulatory Visit (HOSPITAL_BASED_OUTPATIENT_CLINIC_OR_DEPARTMENT_OTHER): Payer: Self-pay

## 2020-04-18 ENCOUNTER — Ambulatory Visit (HOSPITAL_COMMUNITY)
Admission: RE | Admit: 2020-04-18 | Discharge: 2020-04-18 | Disposition: A | Discharge status: Home / Self Care | Payer: Self-pay | Attending: Internal Medicine | Admitting: Internal Medicine

## 2020-04-18 ENCOUNTER — Encounter (HOSPITAL_COMMUNITY): Payer: Self-pay | Admitting: Internal Medicine

## 2020-04-18 ENCOUNTER — Encounter (HOSPITAL_COMMUNITY)
Admission: RE | Disposition: A | Discharge status: Home / Self Care | Payer: Self-pay | Source: Home / Self Care | Attending: Internal Medicine

## 2020-04-18 DIAGNOSIS — I361 Nonrheumatic tricuspid (valve) insufficiency: Secondary | ICD-10-CM

## 2020-04-18 DIAGNOSIS — I2510 Atherosclerotic heart disease of native coronary artery without angina pectoris: Secondary | ICD-10-CM | POA: Insufficient documentation

## 2020-04-18 DIAGNOSIS — I5042 Chronic combined systolic (congestive) and diastolic (congestive) heart failure: Secondary | ICD-10-CM | POA: Insufficient documentation

## 2020-04-18 DIAGNOSIS — Z87891 Personal history of nicotine dependence: Secondary | ICD-10-CM | POA: Insufficient documentation

## 2020-04-18 DIAGNOSIS — R0683 Snoring: Secondary | ICD-10-CM | POA: Insufficient documentation

## 2020-04-18 DIAGNOSIS — Z7984 Long term (current) use of oral hypoglycemic drugs: Secondary | ICD-10-CM | POA: Insufficient documentation

## 2020-04-18 DIAGNOSIS — I110 Hypertensive heart disease with heart failure: Secondary | ICD-10-CM | POA: Insufficient documentation

## 2020-04-18 DIAGNOSIS — Z79899 Other long term (current) drug therapy: Secondary | ICD-10-CM | POA: Insufficient documentation

## 2020-04-18 DIAGNOSIS — D509 Iron deficiency anemia, unspecified: Secondary | ICD-10-CM | POA: Insufficient documentation

## 2020-04-18 DIAGNOSIS — Z7982 Long term (current) use of aspirin: Secondary | ICD-10-CM | POA: Insufficient documentation

## 2020-04-18 DIAGNOSIS — I081 Rheumatic disorders of both mitral and tricuspid valves: Secondary | ICD-10-CM | POA: Insufficient documentation

## 2020-04-18 DIAGNOSIS — I428 Other cardiomyopathies: Secondary | ICD-10-CM | POA: Insufficient documentation

## 2020-04-18 DIAGNOSIS — I5022 Chronic systolic (congestive) heart failure: Secondary | ICD-10-CM

## 2020-04-18 DIAGNOSIS — E119 Type 2 diabetes mellitus without complications: Secondary | ICD-10-CM | POA: Insufficient documentation

## 2020-04-18 DIAGNOSIS — I340 Nonrheumatic mitral (valve) insufficiency: Secondary | ICD-10-CM

## 2020-04-18 DIAGNOSIS — F1410 Cocaine abuse, uncomplicated: Secondary | ICD-10-CM | POA: Insufficient documentation

## 2020-04-18 HISTORY — PX: RIGHT/LEFT HEART CATH AND CORONARY ANGIOGRAPHY: CATH118266

## 2020-04-18 HISTORY — PX: TEE WITHOUT CARDIOVERSION: SHX5443

## 2020-04-18 LAB — POCT I-STAT EG7
Acid-base deficit: 1 mmol/L (ref 0.0–2.0)
Acid-base deficit: 2 mmol/L (ref 0.0–2.0)
Acid-base deficit: 3 mmol/L — ABNORMAL HIGH (ref 0.0–2.0)
Bicarbonate: 22.5 mmol/L (ref 20.0–28.0)
Bicarbonate: 23.2 mmol/L (ref 20.0–28.0)
Bicarbonate: 24.7 mmol/L (ref 20.0–28.0)
Calcium, Ion: 1.07 mmol/L — ABNORMAL LOW (ref 1.15–1.40)
Calcium, Ion: 1.14 mmol/L — ABNORMAL LOW (ref 1.15–1.40)
Calcium, Ion: 1.26 mmol/L (ref 1.15–1.40)
HCT: 31 % — ABNORMAL LOW (ref 39.0–52.0)
HCT: 31 % — ABNORMAL LOW (ref 39.0–52.0)
HCT: 33 % — ABNORMAL LOW (ref 39.0–52.0)
Hemoglobin: 10.5 g/dL — ABNORMAL LOW (ref 13.0–17.0)
Hemoglobin: 10.5 g/dL — ABNORMAL LOW (ref 13.0–17.0)
Hemoglobin: 11.2 g/dL — ABNORMAL LOW (ref 13.0–17.0)
O2 Saturation: 47 %
O2 Saturation: 48 %
O2 Saturation: 49 %
Potassium: 3.7 mmol/L (ref 3.5–5.1)
Potassium: 3.8 mmol/L (ref 3.5–5.1)
Potassium: 4.3 mmol/L (ref 3.5–5.1)
Sodium: 144 mmol/L (ref 135–145)
Sodium: 146 mmol/L — ABNORMAL HIGH (ref 135–145)
Sodium: 146 mmol/L — ABNORMAL HIGH (ref 135–145)
TCO2: 24 mmol/L (ref 22–32)
TCO2: 24 mmol/L (ref 22–32)
TCO2: 26 mmol/L (ref 22–32)
pCO2, Ven: 39.7 mmHg — ABNORMAL LOW (ref 44.0–60.0)
pCO2, Ven: 42.2 mmHg — ABNORMAL LOW (ref 44.0–60.0)
pCO2, Ven: 42.4 mmHg — ABNORMAL LOW (ref 44.0–60.0)
pH, Ven: 7.334 (ref 7.250–7.430)
pH, Ven: 7.374 (ref 7.250–7.430)
pH, Ven: 7.375 (ref 7.250–7.430)
pO2, Ven: 27 mmHg — CL (ref 32.0–45.0)
pO2, Ven: 27 mmHg — CL (ref 32.0–45.0)
pO2, Ven: 27 mmHg — CL (ref 32.0–45.0)

## 2020-04-18 LAB — POCT I-STAT 7, (LYTES, BLD GAS, ICA,H+H)
Acid-base deficit: 3 mmol/L — ABNORMAL HIGH (ref 0.0–2.0)
Bicarbonate: 21.1 mmol/L (ref 20.0–28.0)
Calcium, Ion: 1.13 mmol/L — ABNORMAL LOW (ref 1.15–1.40)
HCT: 32 % — ABNORMAL LOW (ref 39.0–52.0)
Hemoglobin: 10.9 g/dL — ABNORMAL LOW (ref 13.0–17.0)
O2 Saturation: 96 %
Potassium: 4 mmol/L (ref 3.5–5.1)
Sodium: 145 mmol/L (ref 135–145)
TCO2: 22 mmol/L (ref 22–32)
pCO2 arterial: 32.9 mmHg (ref 32.0–48.0)
pH, Arterial: 7.415 (ref 7.350–7.450)
pO2, Arterial: 79 mmHg — ABNORMAL LOW (ref 83.0–108.0)

## 2020-04-18 SURGERY — ECHOCARDIOGRAM, TRANSESOPHAGEAL
Anesthesia: Monitor Anesthesia Care

## 2020-04-18 SURGERY — RIGHT/LEFT HEART CATH AND CORONARY ANGIOGRAPHY
Anesthesia: LOCAL

## 2020-04-18 MED ORDER — HEPARIN SODIUM (PORCINE) 1000 UNIT/ML IJ SOLN
INTRAMUSCULAR | Status: DC | PRN
  Administered 2020-04-18: 5000 [IU] via INTRAVENOUS

## 2020-04-18 MED ORDER — SODIUM CHLORIDE 0.9 % IV SOLN: INTRAVENOUS | Status: DC

## 2020-04-18 MED ORDER — VERAPAMIL HCL 2.5 MG/ML IV SOLN
INTRAVENOUS | Status: DC | PRN
  Administered 2020-04-18: 10 mL via INTRA_ARTERIAL

## 2020-04-18 MED ORDER — SODIUM CHLORIDE 0.9% FLUSH: 3.0000 mL | INTRAVENOUS | Status: DC | PRN

## 2020-04-18 MED ORDER — LIDOCAINE HCL (PF) 1 % IJ SOLN
INTRAMUSCULAR | Status: AC
  Filled 2020-04-18: qty 30

## 2020-04-18 MED ORDER — PROPOFOL 10 MG/ML IV BOLUS
INTRAVENOUS | Status: DC | PRN
  Administered 2020-04-18: 20 mg via INTRAVENOUS

## 2020-04-18 MED ORDER — SODIUM CHLORIDE 0.9 % IV SOLN
250.0000 mL | INTRAVENOUS | Status: DC | PRN
Start: 2020-04-18 — End: 2020-04-18

## 2020-04-18 MED ORDER — HEPARIN SODIUM (PORCINE) 1000 UNIT/ML IJ SOLN
INTRAMUSCULAR | Status: AC
  Filled 2020-04-18: qty 1

## 2020-04-18 MED ORDER — HEPARIN (PORCINE) IN NACL 1000-0.9 UT/500ML-% IV SOLN
INTRAVENOUS | Status: AC
  Filled 2020-04-18: qty 1000

## 2020-04-18 MED ORDER — VERAPAMIL HCL 2.5 MG/ML IV SOLN
INTRAVENOUS | Status: AC
  Filled 2020-04-18: qty 2

## 2020-04-18 MED ORDER — LIDOCAINE HCL (PF) 1 % IJ SOLN
INTRAMUSCULAR | Status: DC | PRN
  Administered 2020-04-18: 5 mL

## 2020-04-18 MED ORDER — SODIUM CHLORIDE 0.9% FLUSH: 3.0000 mL | Freq: Two times a day (BID) | INTRAVENOUS | Status: DC

## 2020-04-18 MED ORDER — IOHEXOL 350 MG/ML SOLN
INTRAVENOUS | Status: DC | PRN
  Administered 2020-04-18: 70 mL

## 2020-04-18 MED ORDER — ACETAMINOPHEN 325 MG PO TABS: 650.0000 mg | ORAL_TABLET | ORAL | Status: DC | PRN

## 2020-04-18 MED ORDER — LABETALOL HCL 5 MG/ML IV SOLN: 10.0000 mg | INTRAVENOUS | Status: DC | PRN

## 2020-04-18 MED ORDER — HEPARIN (PORCINE) IN NACL 1000-0.9 UT/500ML-% IV SOLN
INTRAVENOUS | Status: DC | PRN
  Administered 2020-04-18 (×2): 500 mL

## 2020-04-18 MED ORDER — ASPIRIN 81 MG PO CHEW: 81.0000 mg | CHEWABLE_TABLET | ORAL | Status: DC

## 2020-04-18 MED ORDER — SODIUM CHLORIDE 0.9 % IV SOLN: 250.0000 mL | INTRAVENOUS | Status: DC | PRN

## 2020-04-18 MED ORDER — ONDANSETRON HCL 4 MG/2ML IJ SOLN: 4.0000 mg | Freq: Four times a day (QID) | INTRAMUSCULAR | Status: DC | PRN

## 2020-04-18 MED ORDER — PROPOFOL 500 MG/50ML IV EMUL
INTRAVENOUS | Status: DC | PRN
  Administered 2020-04-18: 125 ug/kg/min via INTRAVENOUS

## 2020-04-18 MED ORDER — HYDRALAZINE HCL 20 MG/ML IJ SOLN: 10.0000 mg | INTRAMUSCULAR | Status: DC | PRN

## 2020-04-18 MED ORDER — FUROSEMIDE 10 MG/ML IJ SOLN
80.0000 mg | Freq: Once | INTRAMUSCULAR | Status: AC
  Administered 2020-04-18: 80 mg via INTRAVENOUS
  Filled 2020-04-18: qty 8

## 2020-04-18 SURGICAL SUPPLY — 10 items
CATH 5FR JL3.5 JR4 ANG PIG MP (CATHETERS) ×2 IMPLANT
CATH BALLN WEDGE 5F 110CM (CATHETERS) ×2 IMPLANT
DEVICE RAD COMP TR BAND LRG (VASCULAR PRODUCTS) ×2 IMPLANT
GLIDESHEATH SLEND SS 6F .021 (SHEATH) ×2 IMPLANT
GUIDEWIRE .025 260CM (WIRE) ×2 IMPLANT
GUIDEWIRE INQWIRE 1.5J.035X260 (WIRE) ×1 IMPLANT
INQWIRE 1.5J .035X260CM (WIRE) ×2
PACK CARDIAC CATHETERIZATION (CUSTOM PROCEDURE TRAY) ×2 IMPLANT
SHEATH GLIDE SLENDER 4/5FR (SHEATH) ×2 IMPLANT
TRANSDUCER W/STOPCOCK (MISCELLANEOUS) ×2 IMPLANT

## 2020-04-18 NOTE — Anesthesia Preprocedure Evaluation (Addendum)
Anesthesia Evaluation  Patient identified by MRN, date of birth, ID band Patient awake    Reviewed: Allergy & Precautions, NPO status , Patient's Chart, lab work & pertinent test results, reviewed documented beta blocker date and time   Airway Mallampati: II  TM Distance: >3 FB Neck ROM: Full    Dental  (+) Dental Advisory Given, Teeth Intact   Pulmonary neg pulmonary ROS, former smoker,    Pulmonary exam normal breath sounds clear to auscultation       Cardiovascular hypertension, Pt. on home beta blockers and Pt. on medications +CHF  Normal cardiovascular exam+ Valvular Problems/Murmurs MR  Rhythm:Regular Rate:Normal  Echo 03/2020 1. Left ventricular ejection fraction, by estimation, is 45 to 50%. The left ventricle has mildly decreased function. The left ventricle demonstrates global hypokinesis. The left ventricular internal cavity size was moderately dilated. Left ventricular diastolic parameters are consistent with Grade II diastolic dysfunction (pseudonormalization). Elevated left ventricular end-diastolic pressure. There is the interventricular septum is flattened in systole and diastole, consistent with right ventricular pressure and volume overload.  2. Right ventricular systolic function is normal. The right ventricular size is severely enlarged. There is severely elevated pulmonary artery systolic pressure.  3. Left atrial size was moderately dilated.  4. Right atrial size was severely dilated.  5. The mitral valve is abnormal. Moderate to severe mitral valve regurgitation. No evidence of mitral stenosis.  6. Tricuspid valve regurgitation is moderate.  7. The aortic valve has an indeterminant number of cusps. Aortic valve regurgitation is not visualized.  8. Severely dilated pulmonary artery.  9. The inferior vena cava is dilated in size with <50% respiratory variability, suggesting right atrial pressure of 15 mmHg.     Neuro/Psych negative neurological ROS     GI/Hepatic negative GI ROS, Neg liver ROS,   Endo/Other  negative endocrine ROS  Renal/GU negative Renal ROS     Musculoskeletal negative musculoskeletal ROS (+)   Abdominal (+) + obese,   Peds  Hematology negative hematology ROS (+)   Anesthesia Other Findings   Reproductive/Obstetrics                            Anesthesia Physical Anesthesia Plan  ASA: III  Anesthesia Plan: MAC   Post-op Pain Management:    Induction: Intravenous  PONV Risk Score and Plan: 1 and Propofol infusion and Treatment may vary due to age or medical condition  Airway Management Planned: Natural Airway  Additional Equipment:   Intra-op Plan:   Post-operative Plan:   Informed Consent: I have reviewed the patients History and Physical, chart, labs and discussed the procedure including the risks, benefits and alternatives for the proposed anesthesia with the patient or authorized representative who has indicated his/her understanding and acceptance.     Dental advisory given  Plan Discussed with: CRNA  Anesthesia Plan Comments:        Anesthesia Quick Evaluation

## 2020-04-18 NOTE — Progress Notes (Signed)
Discharge instructions reviewed with pt and his wife (via telephone) both voice understanding.  

## 2020-04-18 NOTE — Anesthesia Procedure Notes (Signed)
Procedure Name: MAC Date/Time: 04/18/2020 10:35 AM Performed by: Amadeo Garnet, CRNA Pre-anesthesia Checklist: Patient identified, Emergency Drugs available, Suction available and Patient being monitored Patient Re-evaluated:Patient Re-evaluated prior to induction Oxygen Delivery Method: Nasal cannula Preoxygenation: Pre-oxygenation with 100% oxygen Induction Type: IV induction Ventilation: Mask ventilation without difficulty Placement Confirmation: positive ETCO2 Dental Injury: Teeth and Oropharynx as per pre-operative assessment

## 2020-04-18 NOTE — CV Procedure (Signed)
    TRANSESOPHAGEAL ECHOCARDIOGRAM   NAME:  Devon Silva   MRN: [id number] DOB:  [birth year]   ADMIT DATE: 04/18/2020  INDICATIONS:   PROCEDURE:   Informed consent was obtained prior to the procedure. The risks, benefits and alternatives for the procedure were discussed and the patient comprehended these risks.  Risks include, but are not limited to, cough, sore throat, vomiting, nausea, somnolence, esophageal and stomach trauma or perforation, bleeding, low blood pressure, aspiration, pneumonia, infection, trauma to the teeth and death.    After a procedural time-out, the patient was sedated by the anesthesia service with IV propofol.  The transesophageal probe was inserted in the esophagus and stomach without difficulty and multiple views were obtained.    COMPLICATIONS:    There were no immediate complications.  FINDINGS:  LEFT VENTRICLE: EF = 30-35%. Global HK  RIGHT VENTRICLE: Moderate HK  LEFT ATRIUM: Severely dilated  LEFT ATRIAL APPENDAGE: No thrombus.   RIGHT ATRIUM: Severely dilated  AORTIC VALVE:  Trileaflet. No AI/AS  MITRAL VALVE:  Grossly normal with very mild restriction of P2. Leaflets do not coapt due to dilated annulus. Severe (4+) central MR. + flow reversal in PVs  TRICUSPID VALVE: Grossly normal. Leaflets do not coapt due to dilated annulus. Severe (4+) wide open TR   PULMONIC VALVE: Grossly normal. Trivial AI  INTERATRIAL SEPTUM: No PFO or ASD. Septum bows to right  PERICARDIUM: No effusion  DESCENDING AORTA: No significant plaque     [name],MD 10:47 AM

## 2020-04-18 NOTE — Interval H&P Note (Signed)
History and Physical Interval Note:  04/18/2020 10:51 AM  Devon Silva  has presented today for surgery, with the diagnosis of CHF.  The various methods of treatment have been discussed with the patient and family. After consideration of risks, benefits and other options for treatment, the patient has consented to  Procedure(s): RIGHT/LEFT HEART CATH AND CORONARY ANGIOGRAPHY (N/A) and possible percutaneous coronary angioplasty as a surgical intervention.  The patient's history has been reviewed, patient examined, no change in status, stable for surgery.  I have reviewed the patient's chart and labs.  Questions were answered to the patient's satisfaction.     Devon Silva

## 2020-04-18 NOTE — Transfer of Care (Signed)
Immediate Anesthesia Transfer of Care Note  Patient: Danarius Mcconathy  Procedure(s) Performed: TRANSESOPHAGEAL ECHOCARDIOGRAM (TEE) (N/A )  Patient Location: Endoscopy Unit  Anesthesia Type:MAC  Level of Consciousness: drowsy  Airway & Oxygen Therapy: Patient Spontanous Breathing and Patient connected to face mask oxygen  Post-op Assessment: Report given to RN, Post -op Vital signs reviewed and stable and Patient moving all extremities  Post vital signs: Reviewed and stable  Last Vitals:  Vitals Value Taken Time  BP    Temp    Pulse 85 04/18/20 1055  Resp 26 04/18/20 1055  SpO2 95 % 04/18/20 1055  Vitals shown include unvalidated device data.  Last Pain:  Vitals:   04/18/20 0810  TempSrc: Oral  PainSc: 0-No pain         Complications: No complications documented.

## 2020-04-18 NOTE — Discharge Instructions (Signed)
Radial Site Care  This sheet gives you information about how to care for yourself after your procedure. Your health care provider may also give you more specific instructions. If you have problems or questions, contact your health care provider. What can I expect after the procedure? After the procedure, it is common to have:  Bruising and tenderness at the catheter insertion area. Follow these instructions at home: Medicines  Take over-the-counter and prescription medicines only as told by your health care provider. Insertion site care 1. Follow instructions from your health care provider about how to take care of your insertion site. Make sure you: ? Wash your hands with soap and water before you remove your bandage (dressing). If soap and water are not available, use hand sanitizer. ? May remove dressing in 24 hours. 2. Check your insertion site every day for signs of infection. Check for: ? Redness, swelling, or pain. ? Fluid or blood. ? Pus or a bad smell. ? Warmth. 3. Do no take baths, swim, or use a hot tub for 5 days. 4. You may shower 24-48 hours after the procedure. ? Remove the dressing and gently wash the site with plain soap and water. ? Pat the area dry with a clean towel. ? Do not rub the site. That could cause bleeding. 5. Do not apply powder or lotion to the site. Activity  1. For 24 hours after the procedure, or as directed by your health care provider: ? Do not flex or bend the affected arm. ? Do not push or pull heavy objects with the affected arm. ? Do not drive yourself home from the hospital or clinic. You may drive 24 hours after the procedure. ? Do not operate machinery or power tools. ? KEEP ARM ELEVATED THE REMAINDER OF THE DAY. 2. Do not push, pull or lift anything that is heavier than 10 lb for 5 days. 3. Ask your health care provider when it is okay to: ? Return to work or school. ? Resume usual physical activities or sports. ? Resume sexual  activity. General instructions  If the catheter site starts to bleed, raise your arm and put firm pressure on the site. If the bleeding does not stop, get help right away. This is a medical emergency.  DRINK PLENTY OF FLUIDS FOR THE NEXT 2-3 DAYS.  No alcohol consumption for 24 hours after receiving sedation.  If you went home on the same day as your procedure, a responsible adult should be with you for the first 24 hours after you arrive home.  Keep all follow-up visits as told by your health care provider. This is important. Contact a health care provider if:  You have a fever.  You have redness, swelling, or yellow drainage around your insertion site. Get help right away if:  You have unusual pain at the radial site.  The catheter insertion area swells very fast.  The insertion area is bleeding, and the bleeding does not stop when you hold steady pressure on the area.  Your arm or hand becomes pale, cool, tingly, or numb. These symptoms may represent a serious problem that is an emergency. Do not wait to see if the symptoms will go away. Get medical help right away. Call your local emergency services (911 in the [place] Do not drive yourself to the hospital. Summary  After the procedure, it is common to have bruising and tenderness at the site.  Follow instructions from your health care provider about how to take care   of your radial site wound. Check the wound every day for signs of infection.  This information is not intended to replace advice given to you by your health care provider. Make sure you discuss any questions you have with your health care provider. Document Revised: 03/04/2017 Document Reviewed: 03/04/2017 Elsevier Patient Education  2020 Elsevier Inc[employer] 

## 2020-04-19 ENCOUNTER — Encounter (HOSPITAL_COMMUNITY): Payer: Self-pay | Admitting: Internal Medicine

## 2020-04-20 ENCOUNTER — Encounter (HOSPITAL_COMMUNITY): Payer: Self-pay | Admitting: Internal Medicine

## 2020-04-20 NOTE — Anesthesia Postprocedure Evaluation (Signed)
Anesthesia Post Note  Patient: Devon Silva  Procedure(s) Performed: TRANSESOPHAGEAL ECHOCARDIOGRAM (TEE) (N/A )     Patient location during evaluation: Endoscopy Anesthesia Type: MAC Level of consciousness: awake and alert Pain management: pain level controlled Vital Signs Assessment: post-procedure vital signs reviewed and stable Respiratory status: spontaneous breathing Cardiovascular status: stable Anesthetic complications: no   No complications documented.  Last Vitals:  Vitals:   04/18/20 1432 04/18/20 1445  BP: (!) 110/91   Pulse: 81 89  Resp:    Temp:    SpO2: 96% 91%    Last Pain:  Vitals:   04/19/20 1651  TempSrc:   PainSc: 0-No pain                 Nolon Nations

## 2020-04-29 NOTE — Progress Notes (Signed)
ADV[facility]ED [facility] CLINIC NOTE  PCP: Jacquelin Hawking, PA-C Primary Cardiologist: Dr. Shari Prows  HPI:  Devon Silva is a 60 y.o. 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   Diagnosed with systolic HF in [place] in 2018 in setting of severe HTN,  LHC revealed: Moderate 40% stenosis in the mid left circumflex; mild diffuse disease in the left and right coronary system  Moved to [place] in 2019, received most of his care at [facility] free clinic. Admitted 1/22 with COVID PNA.   Admitted 2/22 with HF. Echo read as 45-50% with global hypokinesis and grade 2 diastolic dysfunction, severe RV failure and dilation with severe pulmonary hypertension felt to possibly be secondary to left sided heart disease or longstanding tobacco abuse. Chest CT no PE.   Followed up in HF [facility] clinic and scheduled for TEE and R/L cath on 04/18/20    TEE 04/18/20: LVEF 30-35% RV mod HK. Severe biatrial enlargement. 4+ MR and 4+ TR due to annular dilation  Cath 04/18/20: LM 20%, LAD 20% Ost LCX 60%. RCA 40%   Ao = 93/68 (82) LV = 88/15 RA = 14 RV = 58/12 PA = 50/21 (35) PCW = 26 (no prominent v waves) Fick cardiac output/index = 3.7/1.8 PVR = 2.4 SVR = 1458 FA sat = 96% PA sat = 48%, 49%  Here for f/u to discuss options. Says he feels ok. Breathing much better. Can do all ADLs without problem. No orthopnea or PND. Quit smoking in 12/21. Occasional beer or two. Taking meds    ROS: All systems negative except as listed in HPI, PMH and Problem List.  SH:  Social History   Socioeconomic History  . Marital status: Single    Spouse name: Not on file  . Number of children: Not on file  . Years of education: Not on file  . Highest education level: Not on file  Occupational History  . Not on file  Tobacco Use  . Smoking status: Former Smoker    Packs/day: 0.25    Years: 20.00    Pack years: 5.00    Types: Cigarettes     Quit date: 08/14/2019    Years since quitting: 0.7  . Smokeless tobacco: Never Used  . Tobacco comment: 2 cig/ daily  Vaping Use  . Vaping Use: Never used  Substance and Sexual Activity  . Alcohol use: Not Currently    Alcohol/week: 2.0 standard drinks    Types: 1 Cans of beer, 1 Shots of liquor per week  . Drug use: Not Currently  . Sexual activity: Not on file  Other Topics Concern  . Not on file  Social History Narrative  . Not on file   Social Determinants of Health   Financial Resource Strain: High Risk  . Difficulty of Paying Living Expenses: Very hard  Food Insecurity: No Food Insecurity  . Worried About Programme researcher, broadcasting/film/video in the Last Year: Never true  . Ran Out of Food in the Last Year: Never true  Transportation Needs: Unmet Transportation Needs  . Lack of Transportation (Medical): Yes  . Lack of Transportation (Non-Medical): No  Physical Activity: Not on file  Stress: Not on file  Social Connections: Not on file  Intimate Partner Violence: Not on file    FH:  Family History  Problem Relation Age of Onset  . Cancer Father   . Heart disease Father     Past Medical History:  Diagnosis Date  . CHF (congestive heart failure) (HCC)   . Heart valve problem   . Hypertension     Current Outpatient Medications  Medication Sig Dispense Refill  . aspirin EC 81 MG EC tablet Take 1 tablet (81 mg total) by mouth daily. Swallow whole. 90 tablet 2  . atorvastatin (LIPITOR) 80 MG tablet Take 1 tablet (80 mg total) by mouth at bedtime. 90 tablet 2  . carvedilol (COREG) 6.25 MG tablet Take 1 tablet (6.25 mg total) by mouth 2 (two) times daily with a meal. 120 tablet 2  . dapagliflozin propanediol (FARXIGA) 10 MG TABS tablet Take 1 tablet (10 mg total) by mouth daily. 90 tablet 2  . furosemide (LASIX) 40 MG tablet Take 1 tablet (40 mg total) by mouth as needed for fluid or edema. 45 tablet 0  . losartan (COZAAR) 25 MG tablet Take 1 tablet (25 mg total) by mouth daily. 90  tablet 2  . spironolactone (ALDACTONE) 25 MG tablet Take 0.5 tablets (12.5 mg total) by mouth daily. 45 tablet 3   No current facility-administered medications for this encounter.    Vitals:   04/30/20 1524  BP: 108/68  Pulse: 88  SpO2: 99%  Weight: 95.4 kg (210 lb 6.4 oz)    PHYSICAL EXAM:  General:  Well appearing. No resp difficulty HEENT: normal Neck: supple. JVP 9-10 Carotids 2+ bilat; no bruits. No lymphadenopathy or thryomegaly appreciated. Cor: PMI nondisplaced. Regular rate & rhythm. 3/6 MR to back 2/6 TR Lungs: clear Abdomen: soft, nontender, nondistended. No hepatosplenomegaly. No bruits or masses. Good bowel sounds. Extremities: no cyanosis, clubbing, rash, 1-2+ edema Neuro: alert & orientedx3, cranial nerves grossly intact. moves all 4 extremities w/o difficulty. Affect pleasant   ECG: NSR 89 +LVH +LAE Personally reviewed    ASSESSMENT & PLAN:  1. Chronic systolic HF - due to NICM (suspect HTN) - diagnosed in [place] in 2018 - TEE 04/18/20: LVEF 30-35. RV mod HK. Severe biatrial enlargement. 4+ MR and 4+ TR due to annular dilation  - Cath 04/18/20: LM 20%, LAD 20% Ost LCX 60%. 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 Will increase lasix to 40 mg daily.  - Continue carvedilol 6.25 bid - Continue losartan 25 daily - Increase spiro to 25 mg  daily - Continue Farxiga 10 daily - Consider CPX  2. CAD - non-obstructive by cath 3/22 - no s/s angina - continue ASA/statin  3. HTN - improved control  4. Tobacco/cocaine use - says quit since 12/21  Arvilla Meres,  MD  4:12 PM

## 2020-04-30 ENCOUNTER — Other Ambulatory Visit (HOSPITAL_COMMUNITY): Payer: Self-pay | Admitting: Internal Medicine

## 2020-04-30 ENCOUNTER — Other Ambulatory Visit: Payer: Self-pay

## 2020-04-30 ENCOUNTER — Ambulatory Visit (HOSPITAL_COMMUNITY)
Admission: RE | Admit: 2020-04-30 | Discharge: 2020-04-30 | Disposition: A | Discharge status: Home / Self Care | Payer: Self-pay | Source: Ambulatory Visit | Attending: Internal Medicine | Admitting: Internal Medicine

## 2020-04-30 ENCOUNTER — Encounter (HOSPITAL_COMMUNITY): Payer: Self-pay | Admitting: Internal Medicine

## 2020-04-30 VITALS — BP 108/68 | HR 88 | Wt 210.4 lb

## 2020-04-30 DIAGNOSIS — Z79899 Other long term (current) drug therapy: Secondary | ICD-10-CM | POA: Insufficient documentation

## 2020-04-30 DIAGNOSIS — Z8616 Personal history of COVID-19: Secondary | ICD-10-CM | POA: Insufficient documentation

## 2020-04-30 DIAGNOSIS — I2510 Atherosclerotic heart disease of native coronary artery without angina pectoris: Secondary | ICD-10-CM | POA: Insufficient documentation

## 2020-04-30 DIAGNOSIS — Z7984 Long term (current) use of oral hypoglycemic drugs: Secondary | ICD-10-CM | POA: Insufficient documentation

## 2020-04-30 DIAGNOSIS — I428 Other cardiomyopathies: Secondary | ICD-10-CM | POA: Insufficient documentation

## 2020-04-30 DIAGNOSIS — I110 Hypertensive heart disease with heart failure: Secondary | ICD-10-CM | POA: Insufficient documentation

## 2020-04-30 DIAGNOSIS — I5022 Chronic systolic (congestive) heart failure: Secondary | ICD-10-CM | POA: Insufficient documentation

## 2020-04-30 DIAGNOSIS — I340 Nonrheumatic mitral (valve) insufficiency: Secondary | ICD-10-CM

## 2020-04-30 DIAGNOSIS — Z87891 Personal history of nicotine dependence: Secondary | ICD-10-CM | POA: Insufficient documentation

## 2020-04-30 DIAGNOSIS — E119 Type 2 diabetes mellitus without complications: Secondary | ICD-10-CM | POA: Insufficient documentation

## 2020-04-30 DIAGNOSIS — Z7982 Long term (current) use of aspirin: Secondary | ICD-10-CM | POA: Insufficient documentation

## 2020-04-30 DIAGNOSIS — Z8249 Family history of ischemic heart disease and other diseases of the circulatory system: Secondary | ICD-10-CM | POA: Insufficient documentation

## 2020-04-30 HISTORY — DX: Heart failure, unspecified: I50.9

## 2020-04-30 LAB — BASIC METABOLIC PANEL
Anion gap: 9 (ref 5–15)
BUN: 19 mg/dL (ref 6–20)
CO2: 25 mmol/L (ref 22–32)
Calcium: 9.5 mg/dL (ref 8.9–10.3)
Chloride: 108 mmol/L (ref 98–111)
Creatinine, Ser: 1.32 mg/dL — ABNORMAL HIGH (ref 0.61–1.24)
GFR, Estimated: >60 mL/min (ref >60)
Glucose, Bld: 103 mg/dL — ABNORMAL HIGH (ref 70–99)
Potassium: 4.1 mmol/L (ref 3.5–5.1)
Sodium: 142 mmol/L (ref 135–145)

## 2020-04-30 LAB — BRAIN NATRIURETIC PEPTIDE: B Natriuretic Peptide: 930.4 pg/mL — ABNORMAL HIGH (ref 0.0–100.0)

## 2020-04-30 MED ORDER — FUROSEMIDE 40 MG PO TABS: 40.0000 mg | ORAL_TABLET | Freq: Every day | ORAL | 3 refills | Status: DC

## 2020-04-30 MED ORDER — SPIRONOLACTONE 25 MG PO TABS: 25.0000 mg | ORAL_TABLET | Freq: Every day | ORAL | 3 refills | Status: DC

## 2020-04-30 MED FILL — FUROSEMIDE 40 MG TAB: 40 | 30 days supply | Qty: 30 | Fill #0

## 2020-04-30 MED FILL — spIRONOLACTONE 25 MG TABS: 25 | 30 days supply | Qty: 30 | Fill #0

## 2020-04-30 NOTE — Patient Instructions (Signed)
Increase Furosemide to 40 mg Daily  Increase Spironolactone to 25 mg Daily  Labs done today, we will call you for abnormal results  Your physician recommends that you schedule a follow-up appointment in: 4 weeks  If you have any questions or concerns before your next appointment please send [place] a message through [place] or call our office at [phone].    TO LEAVE A MESSAGE FOR THE NURSE SELECT OPTION 2, PLEASE LEAVE A MESSAGE INCLUDING: . YOUR NAME . DATE OF BIRTH . CALL BACK NUMBER . REASON FOR CALL**this is important as we prioritize the call backs  YOU WILL RECEIVE A CALL BACK THE SAME DAY AS LONG AS YOU CALL BEFORE 4:00 PM  At the [facility], you and your health needs are our priority. As part of our continuing mission to provide you with exceptional heart care, we have created designated Provider Care Teams. These Care Teams include your primary Cardiologist (physician) and Advanced Practice Providers (APPs- Physician Assistants and Nurse Practitioners) who all work together to provide you with the care you need, when you need it.   You may see any of the following providers on your designated Care Team at your next follow up: [place] Kitchen Dr Arvilla Meres . Dr Marca Ancona . Dr Thornell Mule . Tonye Becket, NP . Robbie Lis, PA . Shanda Bumps [place],NP . Karle Plumber, PharmD   Please be sure to bring in all your medications bottles to every appointment.

## 2020-05-01 NOTE — Addendum Note (Signed)
Encounter addended by: Burna Sis, LCSW on: 05/01/2020 9:47 AM  Actions taken: Clinical Note Signed

## 2020-05-01 NOTE — Progress Notes (Signed)
Pt brought in CAFA application and letter from wife explaining their concerns with paying for rent.  CSW met with pt to discuss above and offer further guidance.  CSW explained that CAFA would need to be accompanied by income/asset verification and CSW provided pt with list of required documents.  Pt had also brought in a letter from his wife stating what their rent is and that they need assistance.  Pt had spoken with Heart Failure CSW, Jackie Brennan, in TOC clinic and she had instructed them that we could potentially help with rent but we would need officially documentation from the landlord regarding the rent.  CSW attempted to call pt back following appt to inform of above but unable to reach- left VM  Will continue to follow and assist as needed  Jenna H. Uris, LCSW Clinical Social Worker [facility] Desk#: [phone] Cell#: [phone]   

## 2020-05-02 ENCOUNTER — Other Ambulatory Visit (HOSPITAL_COMMUNITY): Payer: Self-pay | Admitting: Cardiology

## 2020-05-07 ENCOUNTER — Telehealth (HOSPITAL_COMMUNITY): Payer: Self-pay | Admitting: *Deleted

## 2020-05-07 NOTE — Telephone Encounter (Signed)
Spoke with pts wife she said pts meds were mailed to the home and they no longer need assistance.

## 2020-05-10 ENCOUNTER — Telehealth (HOSPITAL_COMMUNITY): Payer: Self-pay | Admitting: Licensed Clinical Social Worker

## 2020-05-10 NOTE — Telephone Encounter (Signed)
CSW returning call from pt wife regarding rental assistance.  She was able to provide landlords information, Shannan Harper [phone], CSW called and left message for landlord so we could discuss assisting with partial payment of the current and past due rent- left message  CSW will continue to follow and assist as needed  Burna Sis, LCSW Clinical Social Worker [facility] Desk#: [phone] Cell#: [phone]

## 2020-05-11 ENCOUNTER — Telehealth (HOSPITAL_COMMUNITY): Payer: Self-pay | Admitting: Licensed Clinical Social Worker

## 2020-05-11 NOTE — Telephone Encounter (Signed)
CSW received call back from pt landlord (Ms. Cedric Fishman [phone]) and explained what we would need in order to assist with some of pt's back pay rent.  CSW emailed Ms. Sharpe w-9 form which we will need completed and returned in able to move forward with check request.  Patient Care fund to provide $500 towards $1,800 outstanding balance.  CSW updated pt wife regarding above- will continue to follow and assist as needed  Burna Sis, LCSW Clinical Social Worker [facility] Desk#: [phone] Cell#: [phone]

## 2020-05-22 ENCOUNTER — Other Ambulatory Visit (HOSPITAL_COMMUNITY): Payer: Self-pay

## 2020-05-23 ENCOUNTER — Other Ambulatory Visit (HOSPITAL_COMMUNITY): Payer: Self-pay

## 2020-05-23 ENCOUNTER — Telehealth (HOSPITAL_COMMUNITY): Payer: Self-pay | Admitting: Licensed Clinical Social Worker

## 2020-05-23 ENCOUNTER — Other Ambulatory Visit (HOSPITAL_COMMUNITY): Payer: Self-pay | Admitting: Internal Medicine

## 2020-05-23 MED ORDER — ATORVASTATIN CALCIUM 80 MG PO TABS
80.0000 mg | ORAL_TABLET | Freq: Every day | ORAL | 2 refills | Status: DC
  Filled 2020-05-23: qty 30, 30d supply, fill #0

## 2020-05-23 MED ORDER — CARVEDILOL 6.25 MG PO TABS
ORAL_TABLET | Freq: Two times a day (BID) | ORAL | 2 refills | Status: DC
  Filled 2020-05-23: qty 60, 30d supply, fill #0

## 2020-05-23 MED ORDER — SPIRONOLACTONE 25 MG PO TABS
25.0000 mg | ORAL_TABLET | Freq: Every day | ORAL | 3 refills | Status: DC
  Filled 2020-05-23: qty 30, 30d supply, fill #0

## 2020-05-23 NOTE — Telephone Encounter (Signed)
CSW received call from pt informing he was having trouble getting medications through [facility].  CSW called [facility] and spoke with representative.  There was a system error with one of the medications and two of the others were sent from not [facility] providers so they were unable to be run under the HF Fund.  CSW had clinic staff resend scripts to pharmacy under HF Fund.  Pt updated regarding above and informed to call back pharmacy in about an hour to discuss when he can pick up scripts.  Will continue to follow and assist as needed  Burna Sis, LCSW Clinical Social Worker [facility] Desk#: [phone] Cell#: [phone]

## 2020-05-24 ENCOUNTER — Telehealth (HOSPITAL_COMMUNITY): Payer: Self-pay | Admitting: Pharmacy Technician

## 2020-05-24 NOTE — Telephone Encounter (Signed)
[facility] Patient Advocate Encounter   Patient was approved to receive Farxiga from [employer]  Patient ID: [id number] Effective dates: 04/18/20 through 04/17/21  Patient received the first shipment on 3/25.  Archer Asa, CPhT

## 2020-05-28 ENCOUNTER — Ambulatory Visit (HOSPITAL_COMMUNITY)
Admission: RE | Admit: 2020-05-28 | Discharge: 2020-05-28 | Disposition: A | Discharge status: Home / Self Care | Payer: Self-pay | Source: Ambulatory Visit | Attending: Adult Health | Admitting: Adult Health

## 2020-05-28 ENCOUNTER — Other Ambulatory Visit (HOSPITAL_COMMUNITY): Payer: Self-pay

## 2020-05-28 ENCOUNTER — Telehealth (HOSPITAL_COMMUNITY): Payer: Self-pay | Admitting: Pharmacy Technician

## 2020-05-28 ENCOUNTER — Other Ambulatory Visit: Payer: Self-pay

## 2020-05-28 ENCOUNTER — Telehealth (HOSPITAL_COMMUNITY): Payer: Self-pay | Admitting: Cardiology

## 2020-05-28 ENCOUNTER — Encounter (HOSPITAL_COMMUNITY): Payer: Self-pay

## 2020-05-28 VITALS — BP 128/98 | HR 78 | Wt 212.6 lb

## 2020-05-28 DIAGNOSIS — I5042 Chronic combined systolic (congestive) and diastolic (congestive) heart failure: Secondary | ICD-10-CM | POA: Insufficient documentation

## 2020-05-28 DIAGNOSIS — Z8616 Personal history of COVID-19: Secondary | ICD-10-CM | POA: Insufficient documentation

## 2020-05-28 DIAGNOSIS — I5022 Chronic systolic (congestive) heart failure: Secondary | ICD-10-CM

## 2020-05-28 DIAGNOSIS — I428 Other cardiomyopathies: Secondary | ICD-10-CM | POA: Insufficient documentation

## 2020-05-28 DIAGNOSIS — I2510 Atherosclerotic heart disease of native coronary artery without angina pectoris: Secondary | ICD-10-CM

## 2020-05-28 DIAGNOSIS — F17200 Nicotine dependence, unspecified, uncomplicated: Secondary | ICD-10-CM

## 2020-05-28 DIAGNOSIS — I110 Hypertensive heart disease with heart failure: Secondary | ICD-10-CM | POA: Insufficient documentation

## 2020-05-28 DIAGNOSIS — I50810 Right heart failure, unspecified: Secondary | ICD-10-CM

## 2020-05-28 DIAGNOSIS — I10 Essential (primary) hypertension: Secondary | ICD-10-CM

## 2020-05-28 DIAGNOSIS — Z8249 Family history of ischemic heart disease and other diseases of the circulatory system: Secondary | ICD-10-CM | POA: Insufficient documentation

## 2020-05-28 DIAGNOSIS — E119 Type 2 diabetes mellitus without complications: Secondary | ICD-10-CM | POA: Insufficient documentation

## 2020-05-28 DIAGNOSIS — Z7984 Long term (current) use of oral hypoglycemic drugs: Secondary | ICD-10-CM | POA: Insufficient documentation

## 2020-05-28 DIAGNOSIS — Z87891 Personal history of nicotine dependence: Secondary | ICD-10-CM | POA: Insufficient documentation

## 2020-05-28 DIAGNOSIS — Z7982 Long term (current) use of aspirin: Secondary | ICD-10-CM | POA: Insufficient documentation

## 2020-05-28 DIAGNOSIS — Z79899 Other long term (current) drug therapy: Secondary | ICD-10-CM | POA: Insufficient documentation

## 2020-05-28 MED ORDER — DIGOXIN 125 MCG PO TABS
0.1250 mg | ORAL_TABLET | Freq: Every day | ORAL | 3 refills | Status: DC
  Filled 2020-05-28: qty 30, 30d supply, fill #0

## 2020-05-28 MED ORDER — FUROSEMIDE 40 MG PO TABS
20.0000 mg | ORAL_TABLET | Freq: Every day | ORAL | 3 refills | Status: DC
  Filled 2020-05-28: qty 15, 30d supply, fill #0

## 2020-05-28 NOTE — Patient Instructions (Signed)
STOP Losartan  START Entresto 24/26mg ,one tab twice a day START Digoxin DECREASE Lasix to 20 mg daily  Your physician has recommended that you have a cardiopulmonary stress test (CPX). CPX testing is a non-invasive measurement of heart and lung function. It replaces a traditional treadmill stress test. This type of test provides a tremendous amount of information that relates not only to your present condition but also for future outcomes. This test combines measurements of you ventilation, respiratory gas exchange in the lungs, electrocardiogram (EKG), blood pressure and physical response before, during, and following an exercise protocol.  Your physician recommends that you schedule a follow-up appointment in: 3-4 weeks with Dr Gala Romney    Do the following things EVERYDAY: 1) Weigh yourself in the morning before breakfast. Write it down and keep it in a log. 2) Take your medicines as prescribed 3) Eat low salt foods--Limit salt (sodium) to 2000 mg per day.  4) Stay as active as you can everyday 5) Limit all fluids for the day to less than 2 liters  At the [facility], you and your health needs are our priority. As part of our continuing mission to provide you with exceptional heart care, we have created designated Provider Care Teams. These Care Teams include your primary Cardiologist (physician) and Advanced Practice Providers (APPs- Physician Assistants and Nurse Practitioners) who all work together to provide you with the care you need, when you need it.   You may see any of the following providers on your designated Care Team at your next follow up: Marland Kitchen Dr Arvilla Meres . Dr Marca Ancona . Dr Thornell Mule . Tonye Becket, NP . Robbie Lis, PA . Shanda Bumps [place],NP . Karle Plumber, PharmD   Please be sure to bring in all your medications bottles to every appointment.   If you have any questions or concerns before your next appointment please send [place] a  message through [place] or call our office at [phone].    TO LEAVE A MESSAGE FOR THE NURSE SELECT OPTION 2, PLEASE LEAVE A MESSAGE INCLUDING: . YOUR NAME . DATE OF BIRTH . CALL BACK NUMBER . REASON FOR CALL**this is important as we prioritize the call backs  YOU WILL RECEIVE A CALL BACK THE SAME DAY AS LONG AS YOU CALL BEFORE 4:00 PM

## 2020-05-28 NOTE — Telephone Encounter (Signed)
appt call reminder completed Pt reports he will be present for appt as long as he can find car keys appt details reviewed

## 2020-05-28 NOTE — Progress Notes (Signed)
ADVANCED HF CLINIC NOTE  PCP: Jacquelin Hawking, PA-C Primary Cardiologist: Dr. Shari Prows HF MD: Dr Gala Romney  HPI: Devon Silva is a 60 y.o. 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   Diagnosed with systolic HF in [place] in 2018 in setting of severe HTN,  LHC revealed: Moderate 40% stenosis in the mid left circumflex; mild diffuse disease in the left and right coronary system  Moved to [place] in 2019, received most of his care at [facility] free clinic. Admitted 1/22 with COVID PNA.   Admitted 2/22 with HF. Echo read as 45-50% with global hypokinesis and grade 2 diastolic dysfunction, severe RV failure and dilation with severe pulmonary hypertension felt to possibly be secondary to left sided heart disease or longstanding tobacco abuse. Chest CT no PE.   Followed up in HF [facility] clinic and scheduled for TEE and R/L cath on 04/18/20    TEE 04/18/20: LVEF 30-35% RV mod HK. Severe biatrial enlargement. 4+ MR and 4+ TR due to annular dilation   Cath 04/18/20: LM 20%, LAD 20% Ost LCX 60%. RCA 40%  Ao = 93/68 (82) LV = 88/15 RA = 14 RV = 58/12 PA = 50/21 (35) PCW = 26 (no prominent v waves) Fick cardiac output/index = 3.7/1.8 PVR = 2.4 SVR = 1458 FA sat = 96% PA sat = 48%, 49%  Today he returns for HF follow up with his wife. Overall feeling tired.  Complaining of shortness of breath with exertion. Gets tired after walking. Last week he was able to cut the grass with push mower but had to take breaks.  Denies PND. No chest pain. Sleeps on 2 -3 pillows.  Appetite ok. No fever or chills.  He has been weighing every now and then.  Taking all medications. Drinks 4 beers on the weekend.   ROS: All systems negative except as listed in HPI, PMH and Problem List.  SH:  Social History   Socioeconomic History  . Marital status: Single    Spouse name: Not on file  . Number of children: Not on file  .  Years of education: Not on file  . Highest education level: Not on file  Occupational History  . Not on file  Tobacco Use  . Smoking status: Former Smoker    Packs/day: 0.25    Years: 20.00    Pack years: 5.00    Types: Cigarettes    Quit date: 08/14/2019    Years since quitting: 0.7  . Smokeless tobacco: Never Used  . Tobacco comment: 2 cig/ daily  Vaping Use  . Vaping Use: Never used  Substance and Sexual Activity  . Alcohol use: Not Currently    Alcohol/week: 2.0 standard drinks    Types: 1 Cans of beer, 1 Shots of liquor per week  . Drug use: Not Currently  . Sexual activity: Not on file  Other Topics Concern  . Not on file  Social History Narrative  . Not on file   Social Determinants of Health   Financial Resource Strain: High Risk  . Difficulty of Paying Living Expenses: Very hard  Food Insecurity: No Food Insecurity  . Worried About Programme researcher, broadcasting/film/video in the Last Year: Never true  . Ran Out of Food in the Last Year: Never true  Transportation Needs: Unmet Transportation Needs  . Lack of Transportation (Medical): Yes  . Lack of Transportation (Non-Medical): No  Physical Activity: Not on file  Stress: Not on file  Social Connections: Not on file  Intimate Partner Violence: Not on file    FH:  Family History  Problem Relation Age of Onset  . Cancer Father   . Heart disease Father     Past Medical History:  Diagnosis Date  . CHF (congestive heart failure) (HCC)   . Heart valve problem   . Hypertension     Current Outpatient Medications  Medication Sig Dispense Refill  . aspirin 81 MG EC tablet TAKE 1 TABLET (81 MG TOTAL) BY MOUTH DAILY. SWALLOW WHOLE. (Patient taking differently: Take by mouth 5 (five) times daily. SWALLOW WHOLE.) 90 tablet 2  . atorvastatin (LIPITOR) 80 MG tablet Take 1 tablet (80 mg total) by mouth at bedtime. 30 tablet 2  . carvedilol (COREG) 6.25 MG tablet Take 1 tablet (6.25 mg total) by mouth 2 (two) times daily with a meal. 120  tablet 2  . dapagliflozin propanediol (FARXIGA) 10 MG TABS tablet Take 1 tablet (10 mg total) by mouth daily. 90 tablet 2  . furosemide (LASIX) 40 MG tablet Take 1 tablet (40 mg total) by mouth daily. 30 tablet 3  . losartan (COZAAR) 25 MG tablet Take 1 tablet (25 mg total) by mouth daily. 90 tablet 2  . spironolactone (ALDACTONE) 25 MG tablet Take 1 tablet (25 mg total) by mouth daily. 30 tablet 3   No current facility-administered medications for this encounter.    Vitals:   05/28/20 1109  BP: (!) 128/98  Pulse: 78  SpO2: 98%  Weight: 96.4 kg (212 lb 9.6 oz)   Wt Readings from Last 3 Encounters:  05/28/20 96.4 kg (212 lb 9.6 oz)  04/30/20 95.4 kg (210 lb 6.4 oz)  04/18/20 93.4 kg (206 lb)     PHYSICAL EXAM: General:   No resp difficulty. Walked in the clinic.  HEENT: normal Neck: supple. no JVD. Carotids 2+ bilat; no bruits. No lymphadenopathy or thryomegaly appreciated. Cor: PMI nondisplaced. Regular rate & rhythm. No rubs, gallops . 3/6 MR to back  Lungs: clear Abdomen: soft, nontender, nondistended. No hepatosplenomegaly. No bruits or masses. Good bowel sounds. Extremities: no cyanosis, clubbing, rash, edema Neuro: alert & orientedx3, cranial nerves grossly intact. moves all 4 extremities w/o difficulty. Affect pleasant  ASSESSMENT & PLAN:  1. Chronic systolic HF - due to NICM (suspect HTN) - diagnosed in [place] in 2018 - TEE 04/18/20: LVEF 30-35. RV mod HK. Severe biatrial enlargement. 4+ MR and 4+ TR due to annular dilation  - Cath 04/18/20: LM 20%, LAD 20% Ost LCX 60%. 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 No longer using. Last smoked in 12/21. No recent cocaine sue.  - Could consider VAD with TV ring - NYHA III. Volume status stable. Cut back lasix to 20 mg a day.   - Continue carvedilol 6.25 bid, no increase with fatigue.  - Stop  losartan and start entresto 24-26 mg twice a day with first dose tonight.  - Continue spiro to 25 mg  daily - Continue Farxiga 10 daily - Start dig 0.125 mg daily.   2. CAD - non-obstructive by cath 3/22 - No chest pain.  - continue ASA/statin  3. HTN Elevated. Adding entresto as noted above.    4. Tobacco/cocaine use - says quit since 12/21  -Set up for CPX test.  -Follow up with Dr Gala Romney after CPX test.  Discussed medication changes. Spoke with his son by phone. He was deployed to [place].   Tonye Becket, NP  11:17 AM

## 2020-05-28 NOTE — Telephone Encounter (Signed)
Patient Advocate Encounter  Patient was seen in clinic today and started on Entresto. He is currently uninsured. 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   Will fax in once signatures are obtained.

## 2020-05-29 ENCOUNTER — Other Ambulatory Visit (HOSPITAL_COMMUNITY): Payer: Self-pay | Admitting: *Deleted

## 2020-05-29 ENCOUNTER — Other Ambulatory Visit (HOSPITAL_COMMUNITY): Payer: Self-pay

## 2020-05-31 ENCOUNTER — Telehealth (HOSPITAL_COMMUNITY): Payer: Self-pay | Admitting: Pharmacy Technician

## 2020-05-31 NOTE — Telephone Encounter (Signed)
Sent in [employer] application via fax.  Will follow up.  

## 2020-06-06 ENCOUNTER — Telehealth (HOSPITAL_COMMUNITY): Payer: Self-pay | Admitting: Pharmacy Technician

## 2020-06-06 ENCOUNTER — Telehealth (HOSPITAL_COMMUNITY): Payer: Self-pay | Admitting: Licensed Clinical Social Worker

## 2020-06-06 NOTE — Telephone Encounter (Signed)
CSW able to send pt landlord a check for $500 through Patient Care fund to assist with past due rent.  No further needs at this time  Burna Sis, LCSW Clinical Social Worker [facility] Desk#: [phone] Cell#: [phone]

## 2020-06-06 NOTE — Addendum Note (Signed)
Addended by: Bea Laura B on: 06/06/2020 09:31 AM   Modules accepted: Orders

## 2020-06-06 NOTE — Telephone Encounter (Signed)
[facility] Patient Advocate Encounter   Patient was approved to receive Entresto from [employer]  Patient ID: [id number] Effective dates: 06/01/20 through 06/01/21  Called and spoke with the patient.   Archer Asa, CPhT

## 2020-06-08 LAB — ECHO TEE: Radius: 0.8 cm

## 2020-06-18 ENCOUNTER — Encounter (HOSPITAL_COMMUNITY): Payer: Medicaid Other

## 2020-06-20 ENCOUNTER — Other Ambulatory Visit (HOSPITAL_COMMUNITY): Payer: Self-pay | Admitting: *Deleted

## 2020-06-20 DIAGNOSIS — I5022 Chronic systolic (congestive) heart failure: Secondary | ICD-10-CM

## 2020-06-21 ENCOUNTER — Other Ambulatory Visit (HOSPITAL_COMMUNITY): Payer: Self-pay | Admitting: *Deleted

## 2020-06-21 ENCOUNTER — Ambulatory Visit (HOSPITAL_COMMUNITY): Payer: Medicaid Other | Attending: Internal Medicine

## 2020-06-21 ENCOUNTER — Other Ambulatory Visit: Payer: Self-pay

## 2020-06-21 DIAGNOSIS — I5022 Chronic systolic (congestive) heart failure: Secondary | ICD-10-CM

## 2020-06-27 ENCOUNTER — Other Ambulatory Visit: Payer: Self-pay

## 2020-06-27 ENCOUNTER — Other Ambulatory Visit (HOSPITAL_COMMUNITY): Payer: Self-pay

## 2020-06-27 ENCOUNTER — Encounter (HOSPITAL_COMMUNITY): Payer: Self-pay | Admitting: Internal Medicine

## 2020-06-27 ENCOUNTER — Ambulatory Visit (HOSPITAL_COMMUNITY)
Admission: RE | Admit: 2020-06-27 | Discharge: 2020-06-27 | Disposition: A | Discharge status: Home / Self Care | Payer: Self-pay | Source: Ambulatory Visit | Attending: Internal Medicine | Admitting: Internal Medicine

## 2020-06-27 ENCOUNTER — Telehealth (HOSPITAL_COMMUNITY): Payer: Self-pay | Admitting: Pharmacy Technician

## 2020-06-27 VITALS — BP 104/60 | HR 70 | Wt 213.2 lb

## 2020-06-27 DIAGNOSIS — Z79899 Other long term (current) drug therapy: Secondary | ICD-10-CM | POA: Insufficient documentation

## 2020-06-27 DIAGNOSIS — Z8249 Family history of ischemic heart disease and other diseases of the circulatory system: Secondary | ICD-10-CM | POA: Insufficient documentation

## 2020-06-27 DIAGNOSIS — I428 Other cardiomyopathies: Secondary | ICD-10-CM | POA: Insufficient documentation

## 2020-06-27 DIAGNOSIS — I2510 Atherosclerotic heart disease of native coronary artery without angina pectoris: Secondary | ICD-10-CM | POA: Insufficient documentation

## 2020-06-27 DIAGNOSIS — I340 Nonrheumatic mitral (valve) insufficiency: Secondary | ICD-10-CM

## 2020-06-27 DIAGNOSIS — I5042 Chronic combined systolic (congestive) and diastolic (congestive) heart failure: Secondary | ICD-10-CM | POA: Insufficient documentation

## 2020-06-27 DIAGNOSIS — I110 Hypertensive heart disease with heart failure: Secondary | ICD-10-CM | POA: Insufficient documentation

## 2020-06-27 DIAGNOSIS — I5022 Chronic systolic (congestive) heart failure: Secondary | ICD-10-CM

## 2020-06-27 DIAGNOSIS — Z8616 Personal history of COVID-19: Secondary | ICD-10-CM | POA: Insufficient documentation

## 2020-06-27 DIAGNOSIS — F17200 Nicotine dependence, unspecified, uncomplicated: Secondary | ICD-10-CM

## 2020-06-27 DIAGNOSIS — E669 Obesity, unspecified: Secondary | ICD-10-CM | POA: Insufficient documentation

## 2020-06-27 DIAGNOSIS — I10 Essential (primary) hypertension: Secondary | ICD-10-CM

## 2020-06-27 DIAGNOSIS — Z7982 Long term (current) use of aspirin: Secondary | ICD-10-CM | POA: Insufficient documentation

## 2020-06-27 DIAGNOSIS — Z87891 Personal history of nicotine dependence: Secondary | ICD-10-CM | POA: Insufficient documentation

## 2020-06-27 MED ORDER — ENTRESTO 24-26 MG PO TABS
1.0000 | ORAL_TABLET | Freq: Two times a day (BID) | ORAL | 3 refills | Status: DC
  Filled 2020-06-27 (×2): qty 60, 30d supply, fill #0
  Filled 2020-08-20: qty 60, 30d supply, fill #1
  Filled 2020-09-20 – 2020-12-07 (×3): qty 60, 30d supply, fill #2

## 2020-06-27 MED ORDER — ASPIRIN 81 MG PO TBEC
81.0000 mg | DELAYED_RELEASE_TABLET | Freq: Every day | ORAL | 2 refills | Status: DC
  Filled 2020-06-27: qty 90, fill #0

## 2020-06-27 MED ORDER — CARVEDILOL 6.25 MG PO TABS
6.2500 mg | ORAL_TABLET | Freq: Two times a day (BID) | ORAL | 2 refills | Status: DC
  Filled 2020-06-27: qty 60, 30d supply, fill #0
  Filled 2020-08-20: qty 60, 30d supply, fill #1
  Filled 2020-09-20: qty 60, 30d supply, fill #2
  Filled 2020-10-24: qty 60, 30d supply, fill #3
  Filled 2020-12-03: qty 60, 30d supply, fill #4
  Filled 2021-01-15: qty 60, 30d supply, fill #5

## 2020-06-27 MED ORDER — ATORVASTATIN CALCIUM 80 MG PO TABS
80.0000 mg | ORAL_TABLET | Freq: Every day | ORAL | 2 refills | Status: DC
  Filled 2020-06-27: qty 30, 30d supply, fill #0

## 2020-06-27 MED ORDER — SPIRONOLACTONE 25 MG PO TABS
25.0000 mg | ORAL_TABLET | Freq: Every day | ORAL | 3 refills | Status: DC
  Filled 2020-06-27: qty 30, 30d supply, fill #0
  Filled 2020-08-20: qty 30, 30d supply, fill #1
  Filled 2020-09-20: qty 30, 30d supply, fill #2
  Filled 2020-10-24: qty 30, 30d supply, fill #3

## 2020-06-27 MED ORDER — FUROSEMIDE 40 MG PO TABS
20.0000 mg | ORAL_TABLET | Freq: Every day | ORAL | 3 refills | Status: DC
  Filled 2020-06-27: qty 15, 30d supply, fill #0
  Filled 2020-08-20: qty 15, 30d supply, fill #1
  Filled 2020-09-20: qty 15, 30d supply, fill #2
  Filled 2020-10-04 – 2020-10-24 (×2): qty 15, 30d supply, fill #3
  Filled 2020-11-09 – 2020-12-03 (×2): qty 15, 30d supply, fill #4

## 2020-06-27 MED ORDER — ASPIRIN 81 MG PO TBEC
81.0000 mg | DELAYED_RELEASE_TABLET | Freq: Every day | ORAL | 3 refills | Status: DC
  Filled 2020-06-27: qty 90, 18d supply, fill #0

## 2020-06-27 MED ORDER — ATORVASTATIN CALCIUM 80 MG PO TABS
80.0000 mg | ORAL_TABLET | Freq: Every day | ORAL | 2 refills | Status: DC
  Filled 2020-06-27: qty 30, 30d supply, fill #0
  Filled 2020-08-20: qty 30, 30d supply, fill #1
  Filled 2020-09-20: qty 30, 30d supply, fill #2

## 2020-06-27 MED ORDER — ENTRESTO 24-26 MG PO TABS
1.0000 | ORAL_TABLET | Freq: Two times a day (BID) | ORAL | 3 refills | Status: DC
  Filled 2020-06-27: qty 60, 30d supply, fill #0

## 2020-06-27 MED ORDER — ASPIRIN EC 81 MG PO TBEC
81.0000 mg | DELAYED_RELEASE_TABLET | Freq: Every day | ORAL | 5 refills | Status: DC
  Filled 2020-06-27: qty 30, 30d supply, fill #0
  Filled 2020-07-25: qty 30, 30d supply, fill #1
  Filled 2020-08-21: qty 30, 30d supply, fill #2
  Filled 2020-09-20: qty 30, 30d supply, fill #3
  Filled 2020-10-24: qty 30, 30d supply, fill #4
  Filled 2020-12-03: qty 30, 30d supply, fill #5

## 2020-06-27 MED ORDER — CARVEDILOL 6.25 MG PO TABS
6.2500 mg | ORAL_TABLET | Freq: Two times a day (BID) | ORAL | 2 refills | Status: DC
Start: 2020-06-27 — End: 2020-06-27
  Filled 2020-06-27: qty 120, 60d supply, fill #0

## 2020-06-27 MED ORDER — DIGOXIN 125 MCG PO TABS
0.1250 mg | ORAL_TABLET | Freq: Every day | ORAL | 3 refills | Status: DC
  Filled 2020-06-27: qty 30, 30d supply, fill #0
  Filled 2020-08-21: qty 30, 30d supply, fill #1
  Filled 2020-09-20: qty 30, 30d supply, fill #2

## 2020-06-27 MED ORDER — DAPAGLIFLOZIN PROPANEDIOL 10 MG PO TABS
10.0000 mg | ORAL_TABLET | Freq: Every day | ORAL | 2 refills | Status: DC
  Filled 2020-06-27 – 2021-01-15 (×2): qty 90, 90d supply, fill #0

## 2020-06-27 NOTE — Telephone Encounter (Signed)
Advanced [facility] Patient Advocate Encounter  The patient was seen in clinic today expressing that he did not have Entresto. Called and provided him the phone number to [employer] and the approval dates. He is aware that he has to call them to request a refill.  Advised the patient to call back with any further issues.  Archer Asa, CPhT

## 2020-06-27 NOTE — Addendum Note (Signed)
Encounter addended by: Crissie Figures, RN on: 06/27/2020 11:33 AM  Actions taken: Order list changed, Diagnosis association updated

## 2020-06-27 NOTE — Patient Instructions (Addendum)
Good to see you!  Please begin Entresto 24/26 twice daily. Follow up with echo as per Appt card. All prescriptions resent to [facility]

## 2020-06-27 NOTE — Progress Notes (Signed)
ADVANCED HF CLINIC NOTE  PCP: Jacquelin Hawking, PA-C Primary Cardiologist: Dr. Shari Prows HF MD: Dr Gala Romney  HPI: Devon Silva is a 60 y.o. 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   Diagnosed with systolic HF in [place] in 2018 in setting of severe HTN,  LHC revealed: Moderate 40% stenosis in the mid left circumflex; mild diffuse disease in the left and right coronary system  Moved to [place] in 2019, received most of his care at [facility] free clinic. Admitted 1/22 with COVID PNA.   Admitted 2/22 with HF. Echo read as 45-50% with global hypokinesis and grade 2 diastolic dysfunction, severe RV failure and dilation with severe pulmonary hypertension felt to possibly be secondary to left sided heart disease or longstanding tobacco abuse. Chest CT no PE.   Followed up in HF [facility] clinic and scheduled for TEE and R/L cath on 04/18/20    TEE 04/18/20: LVEF 30-35% RV mod HK. Severe biatrial enlargement. 4+ MR and 4+ TR due to annular dilation   Cath 04/18/20: LM 20%, LAD 20% Ost LCX 60%. RCA 40%  Ao = 93/68 (82) LV = 88/15 RA = 14 RV = 58/12 PA = 50/21 (35) PCW = 26 (no prominent v waves) Fick cardiac output/index = 3.7/1.8 PVR = 2.4 SVR = 1458 FA sat = 96% PA sat = 48%, 49%  CPX 06/21/20 FVC 3.39 (82%)    FEV1 2.75 (85%)      Resting HR: 89 Peak HR: 162  (100% age predicted max HR)  BP rest: 116/20 BP peak: 196/90  Peak VO2: 16.5 (51% predicted peak VO2; corrected to ibw = 19.4 ml/kg/min) )  VE/VCO2 slope: 37  OUES: 1.56  Peak RER: 1.19  Ventilatory Threshold: 11.6 (36% predicted or measured peak VO2)  VE/MVV: 53%  PETCO2 at peak: 30  O2pulse: 11  (73% predicted O2pulse)   Today he returns for HF follow up.  At last visit losartan switched to [facility] and dig added. Feels a bit better. Gets SOB with mild to moderate activity. No edema, orthopnea or PND.  Rare episodes of  lightheadedness. Drinking a few beers per weekend. No tobacco or cocaine.   ROS: All systems negative except as listed in HPI, PMH and Problem List.  SH:  Social History   Socioeconomic History  . Marital status: Single    Spouse name: Not on file  . Number of children: Not on file  . Years of education: Not on file  . Highest education level: Not on file  Occupational History  . Not on file  Tobacco Use  . Smoking status: Former Smoker    Packs/day: 0.25    Years: 20.00    Pack years: 5.00    Types: Cigarettes    Quit date: 08/14/2019    Years since quitting: 0.8  . Smokeless tobacco: Never Used  . Tobacco comment: 2 cig/ daily  Vaping Use  . Vaping Use: Never used  Substance and Sexual Activity  . Alcohol use: Not Currently    Alcohol/week: 2.0 standard drinks    Types: 1 Cans of beer, 1 Shots of liquor per week  . Drug use: Not Currently  . Sexual activity: Not on file  Other Topics Concern  . Not on file  Social History Narrative  . Not on file   Social Determinants of Health   Financial Resource Strain: High Risk  . Difficulty of Paying Living Expenses: Very hard  Food Insecurity: No Food Insecurity  . Worried About Programme researcher, broadcasting/film/video in the Last Year: Never true  . Ran Out of Food in the Last Year: Never true  Transportation Needs: Unmet Transportation Needs  . Lack of Transportation (Medical): Yes  . Lack of Transportation (Non-Medical): No  Physical Activity: Not on file  Stress: Not on file  Social Connections: Not on file  Intimate Partner Violence: Not on file    FH:  Family History  Problem Relation Age of Onset  . Cancer Father   . Heart disease Father     Past Medical History:  Diagnosis Date  . CHF (congestive heart failure) (HCC)   . Heart valve problem   . Hypertension     Current Outpatient Medications  Medication Sig Dispense Refill  . aspirin 81 MG EC tablet TAKE 1 TABLET (81 MG TOTAL) BY MOUTH DAILY. SWALLOW WHOLE. (Patient  taking differently: Take by mouth daily. SWALLOW WHOLE.) 90 tablet 2  . atorvastatin (LIPITOR) 80 MG tablet Take 1 tablet (80 mg total) by mouth at bedtime. 30 tablet 2  . carvedilol (COREG) 6.25 MG tablet Take 1 tablet (6.25 mg total) by mouth 2 (two) times daily with a meal. 120 tablet 2  . dapagliflozin propanediol (FARXIGA) 10 MG TABS tablet Take 1 tablet (10 mg total) by mouth daily. 90 tablet 2  . digoxin (LANOXIN) 0.125 MG tablet Take 1 tablet (0.125 mg total) by mouth daily. 30 tablet 3  . furosemide (LASIX) 40 MG tablet Take 0.5 tablets (20 mg total) by mouth daily. 30 tablet 3  . spironolactone (ALDACTONE) 25 MG tablet Take 1 tablet (25 mg total) by mouth daily. 30 tablet 3   No current facility-administered medications for this encounter.    Vitals:   06/27/20 0942  BP: 104/60  Pulse: 70  SpO2: 96%  Weight: 96.7 kg (213 lb 3.2 oz)   Wt Readings from Last 3 Encounters:  06/27/20 96.7 kg (213 lb 3.2 oz)  05/28/20 96.4 kg (212 lb 9.6 oz)  04/30/20 95.4 kg (210 lb 6.4 oz)     PHYSICAL EXAM: General:  Well appearing. No resp difficulty HEENT: normal Neck: supple. no JVD. Carotids 2+ bilat; no bruits. No lymphadenopathy or thryomegaly appreciated. Cor: PMI nondisplaced. Regular rate & rhythm. 2/6 MR Lungs: clear Abdomen: soft, nontender, nondistended. No hepatosplenomegaly. No bruits or masses. Good bowel sounds. Extremities: no cyanosis, clubbing, rash, edema Neuro: alert & orientedx3, cranial nerves grossly intact. moves all 4 extremities w/o difficulty. Affect pleasant   ASSESSMENT & PLAN:  1. Chronic systolic HF - due to NICM (suspect HTN) - diagnosed in [place] in 2018 - TEE 04/18/20: LVEF 30-35. RV mod HK. Severe biatrial enlargement. 4+ MR and 4+ TR due to annular dilation  - Cath 04/18/20: LM 20%, LAD 20% Ost LCX 60%. 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 No longer using. Last smoked in 12/21. No recent cocaine use. Need to stop ETOH - Could consider VAD with TV ring - Stable NYHA III. Volume status ok  - Volume status ok. Continue lasix 20 daily as needed   - Continue carvedilol 6.25 bid, no increase with fatigue.  - At last visit started on  Entresto but didn't get it filled. Will fill today - Continue spiro to 25 mg  daily - Continue Farxiga 10 daily - Continue dig 0.125 mg daily.  - Will need 6 month trial of aggressive medical therapy and substance cessation. If situation not improved may need to consider advanced options.  - RTC with echo in 6-8 weeks   2. CAD - non-obstructive by cath 3/22 - No chest pain.  - continue ASA/statin  3. HTN - Blood pressure well controlled. Continue current regimen.  4. Tobacco/cocaine use - says quit since 12/21   Arvilla Meres, MD  10:00 AM

## 2020-07-06 ENCOUNTER — Emergency Department (HOSPITAL_COMMUNITY)
Admission: EM | Admit: 2020-07-06 | Discharge: 2020-07-06 | Disposition: A | Discharge status: Home / Self Care | Payer: Medicaid Other | Attending: Emergency Medicine | Admitting: Emergency Medicine

## 2020-07-06 ENCOUNTER — Encounter (HOSPITAL_COMMUNITY): Payer: Self-pay | Admitting: *Deleted

## 2020-07-06 ENCOUNTER — Other Ambulatory Visit: Payer: Self-pay

## 2020-07-06 DIAGNOSIS — Z79899 Other long term (current) drug therapy: Secondary | ICD-10-CM | POA: Insufficient documentation

## 2020-07-06 DIAGNOSIS — I110 Hypertensive heart disease with heart failure: Secondary | ICD-10-CM | POA: Insufficient documentation

## 2020-07-06 DIAGNOSIS — Z87891 Personal history of nicotine dependence: Secondary | ICD-10-CM | POA: Insufficient documentation

## 2020-07-06 DIAGNOSIS — Z7982 Long term (current) use of aspirin: Secondary | ICD-10-CM | POA: Insufficient documentation

## 2020-07-06 DIAGNOSIS — S0502XA Injury of conjunctiva and corneal abrasion without foreign body, left eye, initial encounter: Secondary | ICD-10-CM | POA: Insufficient documentation

## 2020-07-06 DIAGNOSIS — X58XXXA Exposure to other specified factors, initial encounter: Secondary | ICD-10-CM | POA: Insufficient documentation

## 2020-07-06 DIAGNOSIS — I5043 Acute on chronic combined systolic (congestive) and diastolic (congestive) heart failure: Secondary | ICD-10-CM | POA: Insufficient documentation

## 2020-07-06 MED ORDER — ERYTHROMYCIN 5 MG/GM OP OINT
TOPICAL_OINTMENT | Freq: Once | OPHTHALMIC | Status: AC
  Filled 2020-07-06: qty 3.5

## 2020-07-06 MED ORDER — FLUORESCEIN SODIUM 1 MG OP STRP
1.0000 | ORAL_STRIP | Freq: Once | OPHTHALMIC | Status: AC
  Administered 2020-07-06: 1 via OPHTHALMIC
  Filled 2020-07-06: qty 1

## 2020-07-06 MED ORDER — TETRACAINE HCL 0.5 % OP SOLN
1.0000 [drp] | Freq: Once | OPHTHALMIC | Status: AC
  Administered 2020-07-06: 1 [drp] via OPHTHALMIC
  Filled 2020-07-06: qty 4

## 2020-07-06 NOTE — ED Triage Notes (Signed)
Left eye swelling onset this am

## 2020-07-06 NOTE — Discharge Instructions (Addendum)
Mild corneal abrasion which should heal without difficulty.  Apply the erythromycin ointment to this eye 3 times daily for the next 5 days.  Avoid rubbing your eye as this will increase your eyelid swelling.  As discussed get rechecked if your symptoms are not completely resolved with this treatment plan over the next 4 days.

## 2020-07-06 NOTE — ED Provider Notes (Signed)
[facility] EMERGENCY DEPARTMENT Provider Note   CSN: [id number] Arrival date & time: 07/06/20  1046     History No chief complaint on file.   Devon Silva is a 60 y.o. male history of CHF and hypertension presenting with a 2-day history of left eye irritation which started when he was mowing the lawn.  He denies sensation of foreign body, but reports his left thigh simply feels "gritty" and he does endorse rubbing the eye intermittently.  He reports the eye was initially itchy.  He woke today with upper eyelid edema.  He also notes a mild discharge from the eye, this morning he had a wash dried discharge from the corners in order to open his eye fully.  He has no symptoms in the right eye.  He has been using Visine without improvement in symptoms.  He denies any changes in his vision, states that baseline has vision issues, including needing to wear reading glasses, he has noted no vision changes since this eye complaint began.  He denies fevers or chills, denies headache.  He has been around no other individuals with similar symptoms.  HPI     Past Medical History:  Diagnosis Date  . CHF (congestive heart failure) (HCC)   . Heart valve problem   . Hypertension     Patient Active Problem List   Diagnosis Date Noted  . Right heart failure (HCC) 04/04/2020  . Leukocytosis 04/04/2020  . Substance abuse (HCC) 04/04/2020  . Acute congestive heart failure (HCC)   . Acute on chronic combined systolic and diastolic CHF (congestive heart failure) (HCC) 04/03/2020  . Acute respiratory failure with hypoxia (HCC)   . Essential hypertension 03/05/2019  . Hyperlipidemia 03/05/2019  . Tobacco use disorder 03/05/2019  . Prediabetes 03/05/2019    Past Surgical History:  Procedure Laterality Date  . RIGHT/LEFT HEART CATH AND CORONARY ANGIOGRAPHY N/A 04/18/2020   Procedure: RIGHT/LEFT HEART CATH AND CORONARY ANGIOGRAPHY;  Surgeon: Dolores Patty, MD;  Location: [facility] INVASIVE CV LAB;   Service: Cardiovascular;  Laterality: N/A;  . TEE WITHOUT CARDIOVERSION N/A 04/18/2020   Procedure: TRANSESOPHAGEAL ECHOCARDIOGRAM (TEE);  Surgeon: Dolores Patty, MD;  Location: [facility] ENDOSCOPY;  Service: Cardiovascular;  Laterality: N/A;       Family History  Problem Relation Age of Onset  . Cancer Father   . Heart disease Father     Social History   Tobacco Use  . Smoking status: Former Smoker    Packs/day: 0.25    Years: 20.00    Pack years: 5.00    Types: Cigarettes    Quit date: 08/14/2019    Years since quitting: 0.8  . Smokeless tobacco: Never Used  . Tobacco comment: 2 cig/ daily  Vaping Use  . Vaping Use: Never used  Substance Use Topics  . Alcohol use: Not Currently    Alcohol/week: 2.0 standard drinks    Types: 1 Cans of beer, 1 Shots of liquor per week  . Drug use: Not Currently    Home Medications Prior to Admission medications   Medication Sig Start Date End Date Taking? Authorizing Provider  aspirin EC 81 MG tablet Take 1 tablet (81 mg total) by mouth daily. Swallow whole. Heart Failure Fund 06/27/20   Bensimhon, Bevelyn Buckles, MD  atorvastatin (LIPITOR) 80 MG tablet Take 1 tablet (80 mg total) by mouth at bedtime. 06/27/20   Bensimhon, Bevelyn Buckles, MD  carvedilol (COREG) 6.25 MG tablet Take 1 tablet (6.25 mg total) by mouth 2 (two)  times daily with a meal. 06/27/20   Bensimhon, Bevelyn Buckles, MD  dapagliflozin propanediol (FARXIGA) 10 MG TABS tablet Take 1 tablet (10 mg total) by mouth daily. 06/27/20   Bensimhon, Bevelyn Buckles, MD  digoxin (LANOXIN) 0.125 MG tablet Take 1 tablet (0.125 mg total) by mouth daily. 06/27/20   Bensimhon, Bevelyn Buckles, MD  furosemide (LASIX) 40 MG tablet Take 0.5 tablets (20 mg total) by mouth daily. 06/27/20   Bensimhon, Bevelyn Buckles, MD  sacubitril-valsartan (ENTRESTO) 24-26 MG Take 1 tablet by mouth 2 (two) times daily. 06/27/20   Bensimhon, Bevelyn Buckles, MD  spironolactone (ALDACTONE) 25 MG tablet Take 1 tablet (25 mg total) by mouth daily. 06/27/20   Bensimhon,  Bevelyn Buckles, MD    Allergies    Patient has no known allergies.  Review of Systems   Review of Systems  Constitutional: Negative for chills and fever.  HENT: Negative for congestion and sore throat.   Eyes: Positive for pain, redness and itching.  Respiratory: Negative for chest tightness and shortness of breath.   Cardiovascular: Negative for chest pain.  Gastrointestinal: Negative.  Negative for abdominal pain.  Skin: Negative.  Negative for rash and wound.  Neurological: Negative for dizziness, light-headedness, numbness and headaches.  Psychiatric/Behavioral: Negative.   All other systems reviewed and are negative.   Physical Exam Updated Vital Signs BP (!) 129/95   Pulse 72   Temp 98.2 F (36.8 C) (Oral)   Resp 18   Ht 5\' 11"  (1.803 m)   SpO2 95%   BMI 29.74 kg/m   Physical Exam Vitals and nursing note reviewed.  Constitutional:      Appearance: He is well-developed.  HENT:     Head: Normocephalic and atraumatic.  Eyes:     General: Lids are everted, no foreign bodies appreciated.     Conjunctiva/sclera:     Left eye: Left conjunctiva is injected.     Pupils: Pupils are equal, round, and reactive to light.     Left eye: Corneal abrasion and fluorescein uptake present. Seidel exam negative.    Comments: Visual Acuity Bilateral Distance: 20/40 R Distance: 20/30 L Distance: 20/50  Left upper eyelid is mildly edematous, no erythema.  There is a punctate abrasion at the 7 o'clock position of the left eye.    Cardiovascular:     Rate and Rhythm: Normal rate.  Pulmonary:     Effort: Pulmonary effort is normal.  Musculoskeletal:        General: Normal range of motion.     Cervical back: Normal range of motion.  Skin:    General: Skin is warm and dry.  Neurological:     Mental Status: He is alert.     ED Results / Procedures / Treatments   Labs (all labs ordered are listed, but only abnormal results are displayed) Labs Reviewed - No data to  display  EKG None  Radiology No results found.  Procedures Procedures   Medications Ordered in ED Medications  tetracaine (PONTOCAINE) 0.5 % ophthalmic solution 1 drop (1 drop Left Eye Given 07/06/20 1316)  fluorescein ophthalmic strip 1 strip (1 strip Left Eye Given 07/06/20 1316)  erythromycin ophthalmic ointment ( Left Eye Given 07/06/20 1359)    ED Course  I have reviewed the triage vital signs and the nursing notes.  Pertinent labs & imaging results that were available during my care of the patient were reviewed by me and considered in my medical decision making (see chart for details).  MDM Rules/Calculators/A&P                          Patient with a small left corneal abrasion without any obvious retained foreign body.  His left eye was flushed, erythromycin ointment was started.  Of note, he had complete resolution of eye irritation after 1 drop of tetracaine was applied.  Referral to ophthalmology if symptoms are not improving over the next 4 to 5 days. Final Clinical Impression(s) / ED Diagnoses Final diagnoses:  Abrasion of left cornea, initial encounter    Rx / DC Orders ED Discharge Orders    None       Victoriano Lain 07/06/20 1416    Mancel Bale, MD 07/07/20 1157

## 2020-07-06 NOTE — ED Notes (Signed)
Flushed left eye with 50 cc of NS. 

## 2020-07-25 ENCOUNTER — Other Ambulatory Visit (HOSPITAL_COMMUNITY): Payer: Self-pay

## 2020-08-20 ENCOUNTER — Other Ambulatory Visit (HOSPITAL_COMMUNITY): Payer: Self-pay

## 2020-08-21 ENCOUNTER — Other Ambulatory Visit (HOSPITAL_COMMUNITY): Payer: Self-pay

## 2020-09-03 ENCOUNTER — Emergency Department (HOSPITAL_COMMUNITY)
Admission: EM | Admit: 2020-09-03 | Discharge: 2020-09-03 | Disposition: A | Discharge status: Home / Self Care | Payer: Self-pay | Attending: Emergency Medicine | Admitting: Emergency Medicine

## 2020-09-03 ENCOUNTER — Other Ambulatory Visit: Payer: Self-pay

## 2020-09-03 DIAGNOSIS — Z79899 Other long term (current) drug therapy: Secondary | ICD-10-CM | POA: Insufficient documentation

## 2020-09-03 DIAGNOSIS — Z7982 Long term (current) use of aspirin: Secondary | ICD-10-CM | POA: Insufficient documentation

## 2020-09-03 DIAGNOSIS — I5043 Acute on chronic combined systolic (congestive) and diastolic (congestive) heart failure: Secondary | ICD-10-CM | POA: Insufficient documentation

## 2020-09-03 DIAGNOSIS — I110 Hypertensive heart disease with heart failure: Secondary | ICD-10-CM | POA: Insufficient documentation

## 2020-09-03 DIAGNOSIS — Z87891 Personal history of nicotine dependence: Secondary | ICD-10-CM | POA: Insufficient documentation

## 2020-09-03 DIAGNOSIS — K047 Periapical abscess without sinus: Secondary | ICD-10-CM | POA: Insufficient documentation

## 2020-09-03 MED ORDER — BENZOCAINE 10 % MT GEL: 1.0000 "application " | OROMUCOSAL | 0 refills | Status: DC | PRN

## 2020-09-03 MED ORDER — AMOXICILLIN-POT CLAVULANATE 875-125 MG PO TABS: 1.0000 | ORAL_TABLET | Freq: Two times a day (BID) | ORAL | 0 refills | Status: AC

## 2020-09-03 MED ORDER — AMOXICILLIN-POT CLAVULANATE 875-125 MG PO TABS
1.0000 | ORAL_TABLET | Freq: Once | ORAL | Status: AC
  Administered 2020-09-03: 1 via ORAL
  Filled 2020-09-03: qty 1

## 2020-09-03 MED ORDER — OXYCODONE-ACETAMINOPHEN 5-325 MG PO TABS
1.0000 | ORAL_TABLET | Freq: Once | ORAL | Status: AC
Start: 2020-09-03 — End: 2020-09-03
  Administered 2020-09-03: 1 via ORAL
  Filled 2020-09-03: qty 1

## 2020-09-03 MED ORDER — BENZOCAINE 10 % MT GEL: Freq: Once | OROMUCOSAL | Status: AC

## 2020-09-03 NOTE — ED Provider Notes (Signed)
[facility] EMERGENCY DEPARTMENT Provider Note   CSN: [id number] Arrival date & time: 09/03/20  1428     History Chief Complaint  Patient presents with   Dental Problem    Devon Silva is a 60 y.o. male who presents today with 24 hours of dental pain in the front teeth.  No difficulty swelling but some pain in the mouth when he swallows.  Able to tolerate p.o. somewhat pain in teeth and chewing.  No difficulty breathing.  No fevers no chills, no nausea, no vomiting.  No history of dental infections but states he has not been to the dentist in many years.  States he has a loose tooth in the front near the infections.  I personally read the patient medical records.  His history of CHF, hyperlipidemia, and hypertension.  History of prediabetes but is not diabetic.  HPI     Past Medical History:  Diagnosis Date   CHF (congestive heart failure) (HCC)    Heart valve problem    Hypertension     Patient Active Problem List   Diagnosis Date Noted   Right heart failure (HCC) 04/04/2020   Leukocytosis 04/04/2020   Substance abuse (HCC) 04/04/2020   Acute congestive heart failure (HCC)    Acute on chronic combined systolic and diastolic CHF (congestive heart failure) (HCC) 04/03/2020   Acute respiratory failure with hypoxia (HCC)    Essential hypertension 03/05/2019   Hyperlipidemia 03/05/2019   Tobacco use disorder 03/05/2019   Prediabetes 03/05/2019    Past Surgical History:  Procedure Laterality Date   RIGHT/LEFT HEART CATH AND CORONARY ANGIOGRAPHY N/A 04/18/2020   Procedure: RIGHT/LEFT HEART CATH AND CORONARY ANGIOGRAPHY;  Surgeon: Dolores Patty, MD;  Location: [facility] INVASIVE CV LAB;  Service: Cardiovascular;  Laterality: N/A;   TEE WITHOUT CARDIOVERSION N/A 04/18/2020   Procedure: TRANSESOPHAGEAL ECHOCARDIOGRAM (TEE);  Surgeon: Dolores Patty, MD;  Location: [facility] ENDOSCOPY;  Service: Cardiovascular;  Laterality: N/A;       Family History  Problem Relation Age of  Onset   Cancer Father    Heart disease Father     Social History   Tobacco Use   Smoking status: Former    Packs/day: 0.25    Years: 20.00    Pack years: 5.00    Types: Cigarettes    Quit date: 08/14/2019    Years since quitting: 1.0   Smokeless tobacco: Never   Tobacco comments:    2 cig/ daily  Vaping Use   Vaping Use: Never used  Substance Use Topics   Alcohol use: Not Currently    Alcohol/week: 2.0 standard drinks    Types: 1 Cans of beer, 1 Shots of liquor per week   Drug use: Not Currently    Home Medications Prior to Admission medications   Medication Sig Start Date End Date Taking? Authorizing Provider  amoxicillin-clavulanate (AUGMENTIN) 875-125 MG tablet Take 1 tablet by mouth every 12 (twelve) hours for 10 days. 09/03/20 09/13/20 Yes Sachin Ferencz, Eugene Gavia, PA-C  benzocaine (ORAJEL) 10 % mucosal gel Use as directed 1 application in the mouth or throat as needed for mouth pain. 09/03/20  Yes Cabela Pacifico, Eugene Gavia, PA-C  aspirin EC 81 MG tablet Take 1 tablet (81 mg total) by mouth daily. Swallow whole. Heart Failure Fund 06/27/20   Bensimhon, Bevelyn Buckles, MD  atorvastatin (LIPITOR) 80 MG tablet Take 1 tablet (80 mg total) by mouth at bedtime. 06/27/20   Bensimhon, Bevelyn Buckles, MD  carvedilol (COREG) 6.25 MG tablet Take 1  tablet (6.25 mg total) by mouth 2 (two) times daily with a meal. 06/27/20   Bensimhon, Bevelyn Buckles, MD  dapagliflozin propanediol (FARXIGA) 10 MG TABS tablet Take 1 tablet (10 mg total) by mouth daily. 06/27/20   Bensimhon, Bevelyn Buckles, MD  digoxin (LANOXIN) 0.125 MG tablet Take 1 tablet (0.125 mg total) by mouth daily. 06/27/20   Bensimhon, Bevelyn Buckles, MD  furosemide (LASIX) 40 MG tablet Take 0.5 tablets (20 mg total) by mouth daily. 06/27/20   Bensimhon, Bevelyn Buckles, MD  sacubitril-valsartan (ENTRESTO) 24-26 MG Take 1 tablet by mouth 2 (two) times daily. 06/27/20   Bensimhon, Bevelyn Buckles, MD  spironolactone (ALDACTONE) 25 MG tablet Take 1 tablet (25 mg total) by mouth daily. 06/27/20    Bensimhon, Bevelyn Buckles, MD    Allergies    Patient has no known allergies.  Review of Systems   Review of Systems  Constitutional: Negative.   HENT:  Positive for dental problem. Negative for sore throat, tinnitus, trouble swallowing and voice change.   Respiratory: Negative.    Cardiovascular: Negative.   Gastrointestinal: Negative.   Genitourinary: Negative.   Musculoskeletal: Negative.   Allergic/Immunologic: Negative for immunocompromised state.  Neurological: Negative.    Physical Exam Updated Vital Signs BP (!) 139/91   Pulse 85   Temp 98.1 F (36.7 C) (Oral)   Resp 20   Ht 5\' 11"  (1.803 m)   Wt 98 kg   SpO2 98%   BMI 30.13 kg/m   Physical Exam Vitals and nursing note reviewed.  Constitutional:      Appearance: He is not ill-appearing or toxic-appearing.  HENT:     Head: Normocephalic and atraumatic.     Nose: Nose normal.     Mouth/Throat:     Mouth: Mucous membranes are moist.     Dentition: Gingival swelling, dental caries, dental abscesses and gum lesions present.     Pharynx: Oropharynx is clear. Uvula midline. No oropharyngeal exudate or posterior oropharyngeal erythema.     Tonsils: No tonsillar exudate.      Comments: No sublingual submental tenderness to palpation.  No anterior neck lymphadenopathy, edema, or crepitus. Eyes:     General: Lids are normal. Vision grossly intact. No scleral icterus.       Right eye: No discharge.        Left eye: No discharge.     Extraocular Movements: Extraocular movements intact.     Conjunctiva/sclera: Conjunctivae normal.  Neck:     Trachea: Trachea and phonation normal.  Cardiovascular:     Rate and Rhythm: Normal rate and regular rhythm.     Pulses: Normal pulses.  Pulmonary:     Effort: Pulmonary effort is normal. No respiratory distress.     Breath sounds: Normal breath sounds. No wheezing or rales.  Abdominal:     General: Bowel sounds are normal. There is no distension.     Tenderness: There is no  abdominal tenderness.  Musculoskeletal:        General: No deformity.     Cervical back: Normal range of motion and neck supple. No edema, rigidity or crepitus. No pain with movement, spinous process tenderness or muscular tenderness.  Lymphadenopathy:     Cervical: No cervical adenopathy.  Skin:    General: Skin is warm and dry.  Neurological:     General: No focal deficit present.     Mental Status: He is alert. Mental status is at baseline.  Psychiatric:        Mood  and Affect: Mood normal.    ED Results / Procedures / Treatments   Labs (all labs ordered are listed, but only abnormal results are displayed) Labs Reviewed - No data to display  EKG None  Radiology No results found.  Procedures Procedures   Medications Ordered in ED Medications  benzocaine (ORAJEL) 10 % mucosal gel ( Mouth/Throat Given by Other 09/03/20 1954)  oxyCODONE-acetaminophen (PERCOCET/ROXICET) 5-325 MG per tablet 1 tablet (1 tablet Oral Given 09/03/20 1954)  amoxicillin-clavulanate (AUGMENTIN) 875-125 MG per tablet 1 tablet (1 tablet Oral Given 09/03/20 1953)    ED Course  I have reviewed the triage vital signs and the nursing notes.  Pertinent labs & imaging results that were available during my care of the patient were reviewed by me and considered in my medical decision making (see chart for details).    MDM Rules/Calculators/A&P                         60 year old male presents with dental pain x24 hours.  No history of same.  Differential diagnose includes not limited to apical infection, periapical abscess, angioedema, dental fracture, ulcerative gingivitis, Ludewig's angina.  Hypertensive on intake, vitals otherwise normal.  Cardiopulmonary exam is normal, abdominal exam is benign.  Oral exam revealed periapical infection with abscess at the mandibular central incisors with surrounding gingival edema, erythema, tenderness palpation.  There is fluctuance to suggest abscess amenable to  drainage.  No sublingual or submental tenderness palpation to suggest angioedema or Ludwick's angina.  Versus antibiotics administered in the emergency department as well as dose pain medication as the patient's wife is driving.  Shared decision making regarding the drainage of the dental abscess in the emergency department given large area of fluctuance, patient voiced understanding with treatment options and expressed wishes to defer drainage of the abscess at this time.  Will recommend close outpatient dental follow-up.  Will discharge with antibiotic course and Toradol.  Giovany voiced understanding of medical evaluation and treatment plan.  He was questions answered to expressed satisfaction.  Return cautions given.  Patient is well-appearing, stable, and appropriate for discharge at this time.  This chart was dictated using voice recognition software, Dragon. Despite the best efforts of this provider to proofread and correct errors, errors may still occur which can change documentation meaning.  Final Clinical Impression(s) / ED Diagnoses Final diagnoses:  Dental infection    Rx / DC Orders ED Discharge Orders          Ordered    amoxicillin-clavulanate (AUGMENTIN) 875-125 MG tablet  Every 12 hours        09/03/20 1959    benzocaine (ORAJEL) 10 % mucosal gel  As needed        09/03/20 1959             Delois Silvester, Idelia Salm 09/03/20 2007    Terrilee Files, MD 09/04/20 [phone]

## 2020-09-03 NOTE — ED Triage Notes (Signed)
Dental pain with swelling onset yesterday

## 2020-09-03 NOTE — Discharge Instructions (Addendum)
You are seen in the ER today and diagnosed with a dental infection.  You have been prescribed antibiotics.  Please take entire course of antibiotics as directed.  Continue using ibuprofen / Tylenol, alternating every 3 hours, as well as prescribed Orajel as needed for pain.  You will need to follow-up with your dentist for continued management of this. Please see dental resources below. Return to the emergency department for fevers, swelling or pain under the tongue or in the neck, difficulty breathing or swallowing, nausea or vomiting that does not stop, or any other new or concerning symptoms.  You may utilize the app called GoodRx for discount on your prescriptions.

## 2020-09-10 ENCOUNTER — Other Ambulatory Visit (HOSPITAL_COMMUNITY): Payer: Self-pay

## 2020-09-20 ENCOUNTER — Other Ambulatory Visit (HOSPITAL_COMMUNITY): Payer: Self-pay

## 2020-09-27 ENCOUNTER — Ambulatory Visit (HOSPITAL_BASED_OUTPATIENT_CLINIC_OR_DEPARTMENT_OTHER)
Admission: RE | Admit: 2020-09-27 | Discharge: 2020-09-27 | Disposition: A | Discharge status: Home / Self Care | Payer: Medicaid Other | Source: Ambulatory Visit | Attending: Internal Medicine | Admitting: Internal Medicine

## 2020-09-27 ENCOUNTER — Encounter (HOSPITAL_COMMUNITY): Payer: Self-pay | Admitting: Internal Medicine

## 2020-09-27 ENCOUNTER — Ambulatory Visit (HOSPITAL_COMMUNITY)
Admission: RE | Admit: 2020-09-27 | Discharge: 2020-09-27 | Disposition: A | Discharge status: Home / Self Care | Payer: Medicaid Other | Source: Ambulatory Visit | Attending: Internal Medicine | Admitting: Internal Medicine

## 2020-09-27 ENCOUNTER — Other Ambulatory Visit: Payer: Self-pay

## 2020-09-27 VITALS — BP 116/78 | HR 94 | Wt 223.2 lb

## 2020-09-27 DIAGNOSIS — Z79899 Other long term (current) drug therapy: Secondary | ICD-10-CM | POA: Diagnosis not present

## 2020-09-27 DIAGNOSIS — I50812 Chronic right heart failure: Secondary | ICD-10-CM | POA: Diagnosis not present

## 2020-09-27 DIAGNOSIS — I428 Other cardiomyopathies: Secondary | ICD-10-CM | POA: Diagnosis not present

## 2020-09-27 DIAGNOSIS — E119 Type 2 diabetes mellitus without complications: Secondary | ICD-10-CM | POA: Diagnosis not present

## 2020-09-27 DIAGNOSIS — I5043 Acute on chronic combined systolic (congestive) and diastolic (congestive) heart failure: Secondary | ICD-10-CM | POA: Diagnosis not present

## 2020-09-27 DIAGNOSIS — I2510 Atherosclerotic heart disease of native coronary artery without angina pectoris: Secondary | ICD-10-CM | POA: Insufficient documentation

## 2020-09-27 DIAGNOSIS — Z8616 Personal history of COVID-19: Secondary | ICD-10-CM | POA: Insufficient documentation

## 2020-09-27 DIAGNOSIS — Z87891 Personal history of nicotine dependence: Secondary | ICD-10-CM | POA: Diagnosis not present

## 2020-09-27 DIAGNOSIS — Z8249 Family history of ischemic heart disease and other diseases of the circulatory system: Secondary | ICD-10-CM | POA: Diagnosis not present

## 2020-09-27 DIAGNOSIS — I5022 Chronic systolic (congestive) heart failure: Secondary | ICD-10-CM | POA: Diagnosis not present

## 2020-09-27 DIAGNOSIS — I110 Hypertensive heart disease with heart failure: Secondary | ICD-10-CM | POA: Diagnosis not present

## 2020-09-27 DIAGNOSIS — I5042 Chronic combined systolic (congestive) and diastolic (congestive) heart failure: Secondary | ICD-10-CM | POA: Insufficient documentation

## 2020-09-27 DIAGNOSIS — I272 Pulmonary hypertension, unspecified: Secondary | ICD-10-CM | POA: Diagnosis not present

## 2020-09-27 DIAGNOSIS — Z7982 Long term (current) use of aspirin: Secondary | ICD-10-CM | POA: Insufficient documentation

## 2020-09-27 DIAGNOSIS — Z7984 Long term (current) use of oral hypoglycemic drugs: Secondary | ICD-10-CM | POA: Insufficient documentation

## 2020-09-27 DIAGNOSIS — E785 Hyperlipidemia, unspecified: Secondary | ICD-10-CM | POA: Insufficient documentation

## 2020-09-27 DIAGNOSIS — E669 Obesity, unspecified: Secondary | ICD-10-CM | POA: Diagnosis not present

## 2020-09-27 LAB — BASIC METABOLIC PANEL
Anion gap: 11 (ref 5–15)
BUN: 17 mg/dL (ref 6–20)
CO2: 24 mmol/L (ref 22–32)
Calcium: 10.2 mg/dL (ref 8.9–10.3)
Chloride: 103 mmol/L (ref 98–111)
Creatinine, Ser: 1.05 mg/dL (ref 0.61–1.24)
GFR, Estimated: >60 mL/min (ref >60)
Glucose, Bld: 101 mg/dL — ABNORMAL HIGH (ref 70–99)
Potassium: 3.9 mmol/L (ref 3.5–5.1)
Sodium: 138 mmol/L (ref 135–145)

## 2020-09-27 LAB — ECHOCARDIOGRAM COMPLETE
Area-P 1/2: 2.67 cm2
Calc EF: 38.9 %
S' Lateral: 4.8 cm
Single Plane A2C EF: 30.8 %
Single Plane A4C EF: 45.7 %

## 2020-09-27 LAB — DIGOXIN LEVEL: Digoxin Level: 0.4 ng/mL — ABNORMAL LOW (ref 0.8–2.0)

## 2020-09-27 NOTE — Patient Instructions (Addendum)
Labs were performed today, if labs are abnormal the clinic will call you  STOP Digoxin  Your physician recommends that you schedule a follow-up appointment in: 3-4 months  milAt the [facility], you and your health needs are our priority. As part of our continuing mission to provide you with exceptional heart care, we have created designated Provider Care Teams. These Care Teams include your primary Cardiologist (physician) and Advanced Practice Providers (APPs- Physician Assistants and Nurse Practitioners) who all work together to provide you with the care you need, when you need it.   You may see any of the following providers on your designated Care Team at your next follow up: Dr Arvilla Meres Dr Marca Ancona Dr Brandon Melnick, NP Robbie Lis, [place] Mikki Santee Karle Plumber, PharmD   Please be sure to bring in all your medications bottles to every appointment.    If you have any questions or concerns before your next appointment please send [place] a message through [place] or call our office at [phone].    TO LEAVE A MESSAGE FOR THE NURSE SELECT OPTION 2, PLEASE LEAVE A MESSAGE INCLUDING: YOUR NAME DATE OF BIRTH CALL BACK NUMBER REASON FOR CALL**this is important as we prioritize the call backs  YOU WILL RECEIVE A CALL BACK THE SAME DAY AS LONG AS YOU CALL BEFORE 4:00 PM

## 2020-09-27 NOTE — Progress Notes (Signed)
ADVANCED HF CLINIC NOTE  PCP: Jacquelin Hawking, PA-C Primary Cardiologist: Dr. Shari Prows HF MD: Dr Gala Romney  HPI: Devon Silva is a 60 y.o. 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    Diagnosed with systolic HF in [place] in 2018 in setting of severe HTN,  LHC revealed: Moderate 40% stenosis in the mid left circumflex; mild diffuse disease in the left and right coronary system   Moved to  in 2019, received most of his care at [facility] free clinic. Admitted 1/22 with COVID PNA.   Admitted 2/22 with HF. Echo read as 45-50% with global hypokinesis and grade 2 diastolic dysfunction, severe RV failure and dilation with severe pulmonary hypertension felt to possibly be secondary to left sided heart disease or longstanding tobacco abuse. Chest CT no PE.   Followed up in HF [facility] clinic and scheduled for TEE and R/L cath on 04/18/20    TEE 04/18/20: LVEF 30-35% RV mod HK. Severe biatrial enlargement. 4+ MR and 4+ TR due to annular dilation   Cath 04/18/20: LM 20%, LAD 20% Ost LCX 60%. RCA 40%  Ao = 93/68 (82) LV = 88/15 RA = 14 RV = 58/12 PA = 50/21 (35) PCW = 26 (no prominent v waves) Fick cardiac output/index = 3.7/1.8 PVR = 2.4 SVR = 1458 FA sat = 96% PA sat = 48%, 49%  CPX 06/21/20 FVC 3.39 (82%)      FEV1 2.75 (85%)       Resting HR: 89 Peak HR: 162   (100% age predicted max HR)  BP rest: 116/20 BP peak: 196/90  Peak VO2: 16.5 (51% predicted peak VO2; corrected to ibw = 19.4 ml/kg/min) )  VE/VCO2 slope:  37  OUES: 1.56  Peak RER: 1.19  Ventilatory Threshold: 11.6 (36% predicted or measured peak VO2)  VE/MVV:  53%  PETCO2 at peak:  30  O2pulse:  11   (73% predicted O2pulse)   Today he  returns for HF follow up.Overall feeling fine. SOB with steps. Denies PND/Orthopnea. Appetite ok. No fever or chills. Weight at home 220-223 pounds. Taking all medications. Takes oxycodone for back  and neck pain. Quit smoking 1 year ago. Drinks 1-2 beers a month. Lives with his wife.    ROS: All systems negative except as listed in HPI, PMH and Problem List.  SH:  Social History   Socioeconomic History   Marital status: Married    Spouse name: Not on file   Number of children: Not on file   Years of education: Not on file   Highest education level: Not on file  Occupational History   Not on file  Tobacco Use   Smoking status: Former    Packs/day: 0.25    Years: 20.00    Pack years: 5.00    Types: Cigarettes    Quit date: 08/14/2019    Years since quitting: 1.1   Smokeless tobacco: Never   Tobacco comments:    2 cig/ daily  Vaping Use   Vaping Use: Never used  Substance and Sexual Activity   Alcohol use: Not Currently    Alcohol/week: 2.0 standard drinks    Types: 1 Cans of beer, 1 Shots of liquor per week   Drug use: Not Currently   Sexual activity: Not on file  Other Topics Concern   Not on file  Social History Narrative   Not on file   Social Determinants of Health  Financial Resource Strain: High Risk   Difficulty of Paying Living Expenses: Very hard  Food Insecurity: No Food Insecurity   Worried About Programme researcher, broadcasting/film/video in the Last Year: Never true   Ran Out of Food in the Last Year: Never true  Transportation Needs: Unmet Transportation Needs   Lack of Transportation (Medical): Yes   Lack of Transportation (Non-Medical): No  Physical Activity: Not on file  Stress: Not on file  Social Connections: Not on file  Intimate Partner Violence: Not on file    FH:  Family History  Problem Relation Age of Onset   Cancer Father    Heart disease Father     Past Medical History:  Diagnosis Date   CHF (congestive heart failure) (HCC)    Heart valve problem    Hypertension     Current Outpatient Medications  Medication Sig Dispense Refill   aspirin EC 81 MG tablet Take 1 tablet (81 mg total) by mouth daily. Swallow whole. Heart Failure Fund 30  tablet 5   atorvastatin (LIPITOR) 80 MG tablet Take 1 tablet (80 mg total) by mouth at bedtime. 30 tablet 2   benzocaine (ORAJEL) 10 % mucosal gel Use as directed 1 application in the mouth or throat as needed for mouth pain. 5.3 g 0   carvedilol (COREG) 6.25 MG tablet Take 1 tablet (6.25 mg total) by mouth 2 (two) times daily with a meal. 120 tablet 2   dapagliflozin propanediol (FARXIGA) 10 MG TABS tablet Take 1 tablet (10 mg total) by mouth daily. 90 tablet 2   digoxin (LANOXIN) 0.125 MG tablet Take 1 tablet (0.125 mg total) by mouth daily. 30 tablet 3   furosemide (LASIX) 40 MG tablet Take 0.5 tablets (20 mg total) by mouth daily. 30 tablet 3   sacubitril-valsartan (ENTRESTO) 24-26 MG Take 1 tablet by mouth 2 (two) times daily. 60 tablet 3   spironolactone (ALDACTONE) 25 MG tablet Take 1 tablet (25 mg total) by mouth daily. 30 tablet 3   No current facility-administered medications for this encounter.    Vitals:   09/27/20 1207  BP: 116/78  Pulse: 94  SpO2: 96%  Weight: 101.2 kg (223 lb 3.2 oz)   Wt Readings from Last 3 Encounters:  09/27/20 101.2 kg (223 lb 3.2 oz)  09/03/20 98 kg (216 lb)  06/27/20 96.7 kg (213 lb 3.2 oz)     PHYSICAL EXAM: General:  Well appearing. No resp difficulty HEENT: normal Neck: supple. no JVD. Carotids 2+ bilat; no bruits. No lymphadenopathy or thryomegaly appreciated. Cor: PMI nondisplaced. Regular rate & rhythm. No rubs, gallops or murmurs. Lungs: clear Abdomen: soft, nontender, nondistended. No hepatosplenomegaly. No bruits or masses. Good bowel sounds. Extremities: no cyanosis, clubbing, rash, edema Neuro: alert & orientedx3, cranial nerves grossly intact. moves all 4 extremities w/o difficulty. Affect pleasant   ASSESSMENT & PLAN:  1. Chronic systolic HF - due to NICM (suspect HTN) - diagnosed in [place] in 2018 - TEE 04/18/20: LVEF 30-35. RV mod HK. Severe biatrial enlargement. 4+ MR and 4+ TR due to annular dilation  - Cath 04/18/20: LM  20%, LAD 20% Ost LCX 60%. 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 No longer using.  Last smoked in 01/2020.  No recent cocaine use. Rarely drinks alcohol  Could consider VAD with TV ring - Todays ECHO EF 40-45%. Discussed and reviewed by Dr Gala Romney  - NYHA II-III. Reds Clip 37%  - Volume status ok. Continue lasix 20 daily as needed   - Continue carvedilol 6.25 bid, no increase with fatigue.  - Continue entresto 24-26 mg twice a day.  - Continue spiro to 25 mg  daily - Continue Farxiga 10 daily - EF 40-45%. Stop digoxin.   Check BMET   2. CAD - non-obstructive by cath 3/22 - No chest pain.  - continue ASA/statin  3. HTN - Stable   4. Tobacco/cocaine use - says quit since 12/21  Follow up in 3 months.    Tonye Becket, NP  12:49 PM  Patient seen and examined with the above-signed Advanced Practice Provider and/or Housestaff. I personally reviewed laboratory data, imaging studies and relevant notes. I independently examined the patient and formulated the important aspects of the plan. I have edited the note to reflect any of my changes or salient points. I have personally discussed the plan with the patient and/or family.  Says he is feeling much better. More energy. Compliant with meds. NYHA II  Echo today EF 40-45% Personally reviewed  General:  Well appearing. No resp difficulty HEENT: normal Neck: supple. no JVD. Carotids 2+ bilat; no bruits. No lymphadenopathy or thryomegaly appreciated. Cor: PMI nondisplaced. Regular rate & rhythm. No rubs, gallops or murmurs. Lungs: clear Abdomen: obese  soft, nontender, nondistended. No hepatosplenomegaly. No bruits or masses. Good bowel  sounds. Extremities: no cyanosis, clubbing, rash, edema Neuro: alert & orientedx3, cranial nerves grossly intact. moves all 4 extremities w/o difficulty. Affect pleasant  Much improved symptomatically and by echo. Can stop digoxin. Check labs today.   Arvilla Meres, MD  2:23 PM

## 2020-09-27 NOTE — Progress Notes (Signed)
ReDS Vest / Clip - 09/27/20 1200       ReDS Vest / Clip   Station Marker B    Ruler Value 32    ReDS Value Range Moderate volume overload    ReDS Actual Value 37

## 2020-10-04 ENCOUNTER — Other Ambulatory Visit (HOSPITAL_COMMUNITY): Payer: Self-pay

## 2020-10-24 ENCOUNTER — Other Ambulatory Visit (HOSPITAL_COMMUNITY): Payer: Self-pay

## 2020-10-24 ENCOUNTER — Other Ambulatory Visit (HOSPITAL_COMMUNITY): Payer: Self-pay | Admitting: Internal Medicine

## 2020-10-24 MED ORDER — ATORVASTATIN CALCIUM 80 MG PO TABS
80.0000 mg | ORAL_TABLET | Freq: Every day | ORAL | 2 refills | Status: DC
  Filled 2020-10-24: qty 30, 30d supply, fill #0
  Filled 2020-12-03: qty 30, 30d supply, fill #1
  Filled 2021-01-04 – 2021-01-15 (×2): qty 30, 30d supply, fill #2

## 2020-11-09 ENCOUNTER — Other Ambulatory Visit (HOSPITAL_COMMUNITY): Payer: Self-pay

## 2020-11-29 ENCOUNTER — Other Ambulatory Visit: Payer: Self-pay

## 2020-12-03 ENCOUNTER — Other Ambulatory Visit (HOSPITAL_COMMUNITY): Payer: Self-pay

## 2020-12-03 ENCOUNTER — Other Ambulatory Visit (HOSPITAL_COMMUNITY): Payer: Self-pay | Admitting: Internal Medicine

## 2020-12-03 MED ORDER — SPIRONOLACTONE 25 MG PO TABS
25.0000 mg | ORAL_TABLET | Freq: Every day | ORAL | 3 refills | Status: DC
  Filled 2020-12-03: qty 30, 30d supply, fill #0
  Filled 2021-01-15: qty 30, 30d supply, fill #1
  Filled 2021-02-14: qty 30, 30d supply, fill #2

## 2020-12-05 ENCOUNTER — Other Ambulatory Visit (HOSPITAL_COMMUNITY): Payer: Self-pay

## 2020-12-05 ENCOUNTER — Telehealth (HOSPITAL_COMMUNITY): Payer: Self-pay | Admitting: Licensed Clinical Social Worker

## 2020-12-05 NOTE — Telephone Encounter (Addendum)
CSW reaching out to patient because they are currently receiving medications through [facility].  CSW called to discuss if pt is eligible for any alternative coverage options.  Pt reports he now has Medicaid:  ID: [id number] L Group: [id number] RX BIN: [id number] RX Group: [facility] Rx PCN: [id number]  CSW will continue to follow to assist as needed.  Burna Sis, LCSW Clinical Social Worker [facility] Desk#: [phone] Cell#: [phone]

## 2020-12-07 ENCOUNTER — Other Ambulatory Visit (HOSPITAL_COMMUNITY): Payer: Self-pay

## 2020-12-10 ENCOUNTER — Other Ambulatory Visit (HOSPITAL_COMMUNITY): Payer: Self-pay

## 2020-12-28 NOTE — Progress Notes (Signed)
ADVANCED HF CLINIC NOTE    PCP: Jacquelin Hawking, PA-C Primary Cardiologist: Dr. Shari Prows HF MD: Dr Gala Romney  HPI: Devon Silva is a 60 y.o.. 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    Diagnosed with systolic HF in [place] in 2018 in setting of severe HTN,  LHC revealed: Moderate 40% stenosis in the mid left circumflex; mild diffuse disease in the left and right coronary system   Moved to [place] in 2019, received most of his care at [facility] free clinic. Admitted 1/22 with COVID PNA.   Admitted 2/22 with HF. Echo read as 45-50% with global hypokinesis and grade 2 diastolic dysfunction, severe RV failure and dilation with severe pulmonary hypertension felt to possibly be secondary to left sided heart disease or longstanding tobacco abuse. Chest CT no PE.   Followed up in HF [facility] clinic and scheduled for TEE and R/L cath on 04/18/20    TEE 04/18/20: LVEF 30-35% RV mod HK. Severe biatrial enlargement. 4+ MR and 4+ TR due to annular dilation   Cath 04/18/20: LM 20%, LAD 20% Ost LCX 60%. RCA 40%  Ao = 93/68 (82) LV = 88/15 RA = 14 RV = 58/12 PA = 50/21 (35) PCW = 26 (no prominent v waves) Fick cardiac output/index = 3.7/1.8 PVR = 2.4 SVR = 1458 FA sat = 96% PA sat = 48%, 49%  CPX 06/21/20 FVC 3.39 (82%)      FEV1 2.75 (85%)       Resting HR: 89 Peak HR: 162   (100% age predicted max HR)  BP rest: 116/20 BP peak: 196/90  Peak VO2: 16.5 (51% predicted peak VO2; corrected to ibw = 19.4 ml/kg/min) )  VE/VCO2 slope:  37  OUES: 1.56  Peak RER: 1.19  Ventilatory Threshold: 11.6 (36% predicted or measured peak VO2)  VE/MVV:  53%  PETCO2 at peak:  30  O2pulse:  11   (73% predicted O2pulse)   Today he returns for HF follow up. Overall feeling fine. He is SOB with stairs. Denies palpitations, dizziness, edema, or PND/Orthopnea. Occasional atypical chest pain. Appetite ok. No fever or chills.  Weight at home 230 pounds. Taking all medications. Drinking a lot of fluids. Off tobacco, 2-3 beers on weekends. Walks 2x/week around the block about 15 minutes at a time.  ROS: All systems negative except as listed in HPI, PMH and Problem List.  SH:  Social History   Socioeconomic History   Marital status: Married    Spouse name: Not on file   Number of children: Not on file   Years of education: Not on file   Highest education level: Not on file  Occupational History   Not on file  Tobacco Use   Smoking status: Former    Packs/day: 0.25    Years: 20.00    Pack years: 5.00    Types: Cigarettes    Quit date: 08/14/2019    Years since quitting: 1.3   Smokeless tobacco: Never   Tobacco comments:    2 cig/ daily  Vaping Use   Vaping Use: Never used  Substance and Sexual Activity   Alcohol use: Not Currently    Alcohol/week: 2.0 standard drinks    Types: 1 Cans of beer, 1 Shots of liquor per week   Drug use: Not Currently   Sexual activity: Not on file  Other Topics Concern   Not on file  Social History Narrative  Not on file   Social Determinants of Health   Financial Resource Strain: High Risk   Difficulty of Paying Living Expenses: Very hard  Food Insecurity: No Food Insecurity   Worried About Running Out of Food in the Last Year: Never true   Ran Out of Food in the Last Year: Never true  Transportation Needs: Unmet Transportation Needs   Lack of Transportation (Medical): Yes   Lack of Transportation (Non-Medical): No  Physical Activity: Not on file  Stress: Not on file  Social Connections: Not on file  Intimate Partner Violence: Not on file    FH:  Family History  Problem Relation Age of Onset   Cancer Father    Heart disease Father     Past Medical History:  Diagnosis Date   CHF (congestive heart failure) (HCC)    Heart valve problem    Hypertension     Current Outpatient Medications  Medication Sig Dispense Refill   aspirin EC 81 MG tablet Take  1 tablet (81 mg total) by mouth daily. Swallow whole. Heart Failure Fund 30 tablet 5   atorvastatin (LIPITOR) 80 MG tablet Take 1 tablet (80 mg total) by mouth at bedtime. 30 tablet 2   benzocaine (ORAJEL) 10 % mucosal gel Use as directed 1 application in the mouth or throat as needed for mouth pain. 5.3 g 0   carvedilol (COREG) 6.25 MG tablet Take 1 tablet (6.25 mg total) by mouth 2 (two) times daily with a meal. 120 tablet 2   dapagliflozin propanediol (FARXIGA) 10 MG TABS tablet Take 1 tablet (10 mg total) by mouth daily. 90 tablet 2   furosemide (LASIX) 40 MG tablet Take 0.5 tablets (20 mg total) by mouth daily. 30 tablet 3   sacubitril-valsartan (ENTRESTO) 24-26 MG Take 1 tablet by mouth 2 (two) times daily. 60 tablet 3   spironolactone (ALDACTONE) 25 MG tablet Take 1 tablet (25 mg total) by mouth daily. HF Fund 30 tablet 3   No current facility-administered medications for this encounter.   BP (!) 150/98   Pulse 63   Wt 106.7 kg (235 lb 3.2 oz)   SpO2 97%   BMI 32.80 kg/m   Wt Readings from Last 3 Encounters:  12/31/20 106.7 kg (235 lb 3.2 oz)  09/27/20 101.2 kg (223 lb 3.2 oz)  09/03/20 98 kg (216 lb)   PHYSICAL EXAM: General:  NAD. No resp difficulty, walked into clinic HEENT: Normal Neck: Supple. JVP 6-7. Carotids 2+ bilat; no bruits. No lymphadenopathy or thryomegaly appreciated. Cor: PMI nondisplaced. Regular rate & rhythm. No rubs, gallops or murmurs. Lungs: Clear Abdomen: Obese, nontender, nondistended. No hepatosplenomegaly. No bruits or masses. Good bowel sounds. Extremities: No cyanosis, clubbing, rash, edema Neuro: Alert & oriented x 3, cranial nerves grossly intact. Moves all 4 extremities w/o difficulty. Affect pleasant.  ReDs: 39%  ASSESSMENT & PLAN:  1. Chronic systolic HF - due to NICM (suspect HTN) - diagnosed in [place] in 2018 - TEE 04/18/20: LVEF 30-35. RV mod HK. Severe biatrial enlargement. 4+ MR and 4+ TR due to annular dilation  - Cath 04/18/20: LM  20%, LAD 20% Ost LCX 60%. 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 - Ideally transplant w/u would be best option but not candidate with tobacco/cocaine use. No longer using.  Last smoked in 01/2020.  No recent cocaine use. Rarely drinks alcohol - Could consider VAD with TV ring - Echo (8/22) EF 40-45% - NYHA II. Volume elevated today, weight up 12 lbs over past 3 months, ReDs 39% - Increase Entresto to 49/51 mg bid. - Take Lasix 20 mg daily x 3 days then PRN weight gain/edema. - Continue carvedilol 6.25 bid, no increase with fatigue.  - Continue spiro 25 mg daily. - Continue Farxiga 10 daily. - Instructed to limit fluids to less than 2 L or 64 oz a day. - BMET today, repeat in 10 days.  2. CAD - Non-obstructive by cath 3/22 - Some recent atypical chest pain, likely due to increased volume today. - Continue ASA/statin.  3. HTN - Elevated today.  - Increase Entresto as above.   4. Tobacco/cocaine use - says quit since 12/21.  Follow up in 4 weeks with APP + 3 months with Dr. Gala Romney  Jacklynn Ganong, FNP  1:41 PM

## 2020-12-31 ENCOUNTER — Ambulatory Visit (HOSPITAL_COMMUNITY)
Admission: RE | Admit: 2020-12-31 | Discharge: 2020-12-31 | Disposition: A | Discharge status: Home / Self Care | Payer: Medicaid Other | Source: Ambulatory Visit | Attending: Family Medicine | Admitting: Family Medicine

## 2020-12-31 ENCOUNTER — Encounter (HOSPITAL_COMMUNITY): Payer: Self-pay

## 2020-12-31 ENCOUNTER — Other Ambulatory Visit (HOSPITAL_COMMUNITY): Payer: Self-pay

## 2020-12-31 VITALS — BP 150/98 | HR 63 | Wt 235.2 lb

## 2020-12-31 DIAGNOSIS — Z8616 Personal history of COVID-19: Secondary | ICD-10-CM | POA: Insufficient documentation

## 2020-12-31 DIAGNOSIS — I5022 Chronic systolic (congestive) heart failure: Secondary | ICD-10-CM

## 2020-12-31 DIAGNOSIS — Z87891 Personal history of nicotine dependence: Secondary | ICD-10-CM | POA: Insufficient documentation

## 2020-12-31 DIAGNOSIS — E119 Type 2 diabetes mellitus without complications: Secondary | ICD-10-CM | POA: Insufficient documentation

## 2020-12-31 DIAGNOSIS — R0602 Shortness of breath: Secondary | ICD-10-CM | POA: Insufficient documentation

## 2020-12-31 DIAGNOSIS — I110 Hypertensive heart disease with heart failure: Secondary | ICD-10-CM | POA: Diagnosis not present

## 2020-12-31 DIAGNOSIS — Z6832 Body mass index (BMI) 32.0-32.9, adult: Secondary | ICD-10-CM | POA: Insufficient documentation

## 2020-12-31 DIAGNOSIS — F17200 Nicotine dependence, unspecified, uncomplicated: Secondary | ICD-10-CM | POA: Diagnosis not present

## 2020-12-31 DIAGNOSIS — E669 Obesity, unspecified: Secondary | ICD-10-CM | POA: Diagnosis not present

## 2020-12-31 DIAGNOSIS — Z7982 Long term (current) use of aspirin: Secondary | ICD-10-CM | POA: Insufficient documentation

## 2020-12-31 DIAGNOSIS — R0789 Other chest pain: Secondary | ICD-10-CM | POA: Insufficient documentation

## 2020-12-31 DIAGNOSIS — I5042 Chronic combined systolic (congestive) and diastolic (congestive) heart failure: Secondary | ICD-10-CM | POA: Diagnosis not present

## 2020-12-31 DIAGNOSIS — Z7984 Long term (current) use of oral hypoglycemic drugs: Secondary | ICD-10-CM | POA: Insufficient documentation

## 2020-12-31 DIAGNOSIS — F1910 Other psychoactive substance abuse, uncomplicated: Secondary | ICD-10-CM

## 2020-12-31 DIAGNOSIS — Z79899 Other long term (current) drug therapy: Secondary | ICD-10-CM | POA: Insufficient documentation

## 2020-12-31 DIAGNOSIS — I272 Pulmonary hypertension, unspecified: Secondary | ICD-10-CM | POA: Insufficient documentation

## 2020-12-31 DIAGNOSIS — Z7901 Long term (current) use of anticoagulants: Secondary | ICD-10-CM | POA: Insufficient documentation

## 2020-12-31 DIAGNOSIS — I2510 Atherosclerotic heart disease of native coronary artery without angina pectoris: Secondary | ICD-10-CM | POA: Insufficient documentation

## 2020-12-31 DIAGNOSIS — I10 Essential (primary) hypertension: Secondary | ICD-10-CM | POA: Diagnosis not present

## 2020-12-31 DIAGNOSIS — I428 Other cardiomyopathies: Secondary | ICD-10-CM | POA: Insufficient documentation

## 2020-12-31 DIAGNOSIS — Z09 Encounter for follow-up examination after completed treatment for conditions other than malignant neoplasm: Secondary | ICD-10-CM | POA: Insufficient documentation

## 2020-12-31 LAB — BASIC METABOLIC PANEL
Anion gap: 8 (ref 5–15)
BUN: 15 mg/dL (ref 6–20)
CO2: 23 mmol/L (ref 22–32)
Calcium: 9.5 mg/dL (ref 8.9–10.3)
Chloride: 109 mmol/L (ref 98–111)
Creatinine, Ser: 1.01 mg/dL (ref 0.61–1.24)
GFR, Estimated: >60 mL/min (ref >60)
Glucose, Bld: 99 mg/dL (ref 70–99)
Potassium: 3.9 mmol/L (ref 3.5–5.1)
Sodium: 140 mmol/L (ref 135–145)

## 2020-12-31 MED ORDER — FUROSEMIDE 20 MG PO TABS
20.0000 mg | ORAL_TABLET | ORAL | 3 refills | Status: DC | PRN
  Filled 2020-12-31: qty 30, fill #0

## 2020-12-31 MED ORDER — ENTRESTO 49-51 MG PO TABS
1.0000 | ORAL_TABLET | Freq: Two times a day (BID) | ORAL | 4 refills | Status: DC
  Filled 2020-12-31 – 2021-01-15 (×2): qty 60, 30d supply, fill #0

## 2020-12-31 MED ORDER — FUROSEMIDE 20 MG PO TABS
20.0000 mg | ORAL_TABLET | ORAL | 3 refills | Status: DC | PRN
  Filled 2020-12-31 – 2021-01-15 (×2): qty 30, 30d supply, fill #0
  Filled 2021-02-14: qty 30, 30d supply, fill #1

## 2020-12-31 NOTE — Progress Notes (Signed)
ReDS Vest / Clip - 12/31/20 1300       ReDS Vest / Clip   Station Marker D    Ruler Value 33.5    ReDS Value Range Moderate volume overload    ReDS Actual Value 39

## 2020-12-31 NOTE — Patient Instructions (Signed)
Labs were done today, if any labs are abnormal the clinic will call you  INCREASE Entresto 49-51 mg 1 tablet twice daily   TAKE Lasix 20 mg daily for 3 days then as needed for weight gain of 3 lbs in 24 hours or 5 lbs in a week  LIMIT fluid to 2 liters/64 ounces daily  Your physician recommends that you schedule a follow-up appointment in: 6 weeks and in 3 months   At the [facility], you and your health needs are our priority. As part of our continuing mission to provide you with exceptional heart care, we have created designated Provider Care Teams. These Care Teams include your primary Cardiologist (physician) and Advanced Practice Providers (APPs- Physician Assistants and Nurse Practitioners) who all work together to provide you with the care you need, when you need it.   You may see any of the following providers on your designated Care Team at your next follow up: Dr Arvilla Meres Dr Carron Curie, NP Robbie Lis, [facility] [place], [place] Karle Plumber, PharmD   Please be sure to bring in all your medications bottles to every appointment.   If you have any questions or concerns before your next appointment please send [place] a message through [place] or call our office at [phone].    TO LEAVE A MESSAGE FOR THE NURSE SELECT OPTION 2, PLEASE LEAVE A MESSAGE INCLUDING: YOUR NAME DATE OF BIRTH CALL BACK NUMBER REASON FOR CALL**this is important as we prioritize the call backs  YOU WILL RECEIVE A CALL BACK THE SAME DAY AS LONG AS YOU CALL BEFORE 4:00 PM

## 2021-01-04 ENCOUNTER — Other Ambulatory Visit (HOSPITAL_COMMUNITY): Payer: Self-pay

## 2021-01-08 ENCOUNTER — Other Ambulatory Visit (HOSPITAL_COMMUNITY): Payer: Self-pay

## 2021-01-15 ENCOUNTER — Other Ambulatory Visit (HOSPITAL_COMMUNITY): Payer: Self-pay

## 2021-01-15 ENCOUNTER — Ambulatory Visit (HOSPITAL_COMMUNITY)
Admission: RE | Admit: 2021-01-15 | Discharge: 2021-01-15 | Disposition: A | Discharge status: Home / Self Care | Payer: Medicaid Other | Source: Ambulatory Visit | Attending: Internal Medicine | Admitting: Internal Medicine

## 2021-01-15 ENCOUNTER — Other Ambulatory Visit: Payer: Self-pay

## 2021-01-15 DIAGNOSIS — I50810 Right heart failure, unspecified: Secondary | ICD-10-CM

## 2021-01-15 DIAGNOSIS — I5022 Chronic systolic (congestive) heart failure: Secondary | ICD-10-CM | POA: Diagnosis present

## 2021-01-15 LAB — BASIC METABOLIC PANEL
Anion gap: 10 (ref 5–15)
BUN: 9 mg/dL (ref 6–20)
CO2: 25 mmol/L (ref 22–32)
Calcium: 9.7 mg/dL (ref 8.9–10.3)
Chloride: 106 mmol/L (ref 98–111)
Creatinine, Ser: 1.14 mg/dL (ref 0.61–1.24)
GFR, Estimated: >60 mL/min (ref >60)
Glucose, Bld: 137 mg/dL — ABNORMAL HIGH (ref 70–99)
Potassium: 4.3 mmol/L (ref 3.5–5.1)
Sodium: 141 mmol/L (ref 135–145)

## 2021-01-22 ENCOUNTER — Other Ambulatory Visit (HOSPITAL_COMMUNITY): Payer: Self-pay

## 2021-01-22 ENCOUNTER — Telehealth (HOSPITAL_COMMUNITY): Payer: Self-pay | Admitting: Pharmacy Technician

## 2021-01-22 NOTE — Telephone Encounter (Signed)
Advanced Heart Failure Patient Advocate Encounter  Received notification from [employer] that the patient needs to renew his [place] assistance. The patient is currently insured on a managed medicaid plan. Will submit PA's for both medications. As long as they are approved we will not seek renewal.  Received notification from [facility] that prior authorization for Marcelline Deist is required.   PA submitted on CoverMyMeds Key  [id number] Status is pending   Will continue to follow.  Received notification from [facility] Medicaid that prior authorization for Sherryll Burger is required.   PA submitted on CoverMyMeds Key  [id number] Status is pending   Will continue to follow.

## 2021-01-23 ENCOUNTER — Other Ambulatory Visit (HOSPITAL_COMMUNITY): Payer: Self-pay | Admitting: *Deleted

## 2021-01-23 ENCOUNTER — Other Ambulatory Visit (HOSPITAL_COMMUNITY): Payer: Self-pay

## 2021-01-23 MED ORDER — DAPAGLIFLOZIN PROPANEDIOL 10 MG PO TABS
10.0000 mg | ORAL_TABLET | Freq: Every day | ORAL | 11 refills | Status: DC
  Filled 2021-01-23: qty 30, 30d supply, fill #0

## 2021-01-23 MED ORDER — ENTRESTO 49-51 MG PO TABS
1.0000 | ORAL_TABLET | Freq: Two times a day (BID) | ORAL | 2 refills | Status: DC
Start: 2021-01-23 — End: 2021-02-18
  Filled 2021-01-23: qty 60, 30d supply, fill #0

## 2021-01-23 NOTE — Telephone Encounter (Signed)
Advanced Heart Failure Patient Advocate Encounter  Prior Authorization for Sherryll Burger has been approved.    PA# [id number] Effective dates: 01/22/21 through 01/22/22  Patients co-pay is $4 (90 days)  Advanced Heart Failure Patient Advocate Encounter  Prior Authorization for Marcelline Deist has been approved.    PA# [id number] Effective dates: 01/22/21 through 01/22/22  Patients co-pay is $4 (insurance will only allow 30 day billing)  Sent Jasmine (CMA) a request to send in [facility]s [facility]. Called and left the patient a message regarding the change.  Archer Asa, CPhT

## 2021-01-28 ENCOUNTER — Ambulatory Visit (HOSPITAL_COMMUNITY): Payer: Medicaid Other | Attending: Anesthesiology | Admitting: Physical Therapy

## 2021-01-28 ENCOUNTER — Encounter (HOSPITAL_COMMUNITY): Payer: Self-pay | Admitting: Physical Therapy

## 2021-01-28 ENCOUNTER — Other Ambulatory Visit (HOSPITAL_COMMUNITY): Payer: Self-pay

## 2021-01-28 ENCOUNTER — Other Ambulatory Visit: Payer: Self-pay

## 2021-01-28 DIAGNOSIS — R2689 Other abnormalities of gait and mobility: Secondary | ICD-10-CM | POA: Diagnosis present

## 2021-01-28 DIAGNOSIS — M545 Low back pain, unspecified: Secondary | ICD-10-CM | POA: Insufficient documentation
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
## 2021-01-31 ENCOUNTER — Ambulatory Visit (HOSPITAL_COMMUNITY): Payer: Medicaid Other | Admitting: Physical Therapy

## 2021-02-06 ENCOUNTER — Other Ambulatory Visit: Payer: Self-pay

## 2021-02-06 ENCOUNTER — Ambulatory Visit (HOSPITAL_COMMUNITY): Payer: Medicaid Other

## 2021-02-06 DIAGNOSIS — M545 Low back pain, unspecified: Secondary | ICD-10-CM | POA: Diagnosis not present

## 2021-02-06 DIAGNOSIS — R2689 Other abnormalities of gait and mobility: Secondary | ICD-10-CM
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
## 2021-02-08 ENCOUNTER — Ambulatory Visit (HOSPITAL_COMMUNITY): Payer: Medicaid Other

## 2021-02-08 ENCOUNTER — Other Ambulatory Visit: Payer: Self-pay

## 2021-02-08 ENCOUNTER — Encounter (HOSPITAL_COMMUNITY): Payer: Self-pay

## 2021-02-08 DIAGNOSIS — R2689 Other abnormalities of gait and mobility: Secondary | ICD-10-CM

## 2021-02-08 DIAGNOSIS — M545 Low back pain, unspecified: Secondary | ICD-10-CM
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
## 2021-02-13 ENCOUNTER — Ambulatory Visit (HOSPITAL_COMMUNITY): Payer: Medicaid Other | Attending: Anesthesiology

## 2021-02-13 ENCOUNTER — Encounter (HOSPITAL_COMMUNITY): Payer: Self-pay

## 2021-02-13 ENCOUNTER — Other Ambulatory Visit: Payer: Self-pay

## 2021-02-13 DIAGNOSIS — R2689 Other abnormalities of gait and mobility: Secondary | ICD-10-CM | POA: Diagnosis present

## 2021-02-13 DIAGNOSIS — M545 Low back pain, unspecified: Secondary | ICD-10-CM | POA: Diagnosis not present
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
## 2021-02-13 NOTE — Progress Notes (Signed)
ADVANCED HF CLINIC NOTE    PCP: Soyla Dryer, PA-C Primary Cardiologist: Dr. Johney Frame HF MD: Dr Haroldine Laws  HPI: Devon Silva is a 61 y.o.. 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    Diagnosed with systolic HF in [place] in [id number] in setting of severe HTN,  LHC revealed: Moderate 40% stenosis in the mid left circumflex; mild diffuse disease in the left and right coronary system   Moved to [place] in 2019, received most of his care at [facility] free clinic. Admitted 1/22 with COVID PNA.   Admitted 2/22 with HF. Echo read as 45-50% with global hypokinesis and grade 2 diastolic dysfunction, severe RV failure and dilation with severe pulmonary hypertension felt to possibly be secondary to left sided heart disease or longstanding tobacco abuse. Chest CT no PE.   Followed up in HF [facility] clinic and scheduled for TEE and R/L cath on 04/18/20 (see below).   Cath showed non-obs CAD, severe NICM with low output and moderately elevated filling pressures. CPX showed moderate limitation due to HF and body habitus.  Today he returns for HF follow up. Main complaint is headache ongoing for 2-3 months and dry hands. Feels he has some fluid on board today. Breathing is better, still remains SOB with stairs but can walk further now on flat ground. Atypical chest pain once a week, resolves with activity and left arm stretching. Denies palpitations, GU symptoms, dizziness, or PND/Orthopnea. Appetite ok. No fever or chills. Weight at home 236 pounds, weighs once a week. Taking all medications but ran out of Lasix. Cooks most meals at home. Off tobacco, 2-3 beers on weekends. Walks 2x/week around the block about 10 minutes at a time.  Cardiac Studies: - TEE 04/18/20: LVEF 30-35% RV mod HK. Severe biatrial enlargement. 4+ MR and 4+ TR due to annular dilation   - Cath 04/18/20: LM 20%, LAD 20% Ost LCX 60%. RCA 40%  Ao = 93/68  (82) LV = 88/15 RA = 14 RV = 58/12 PA = 50/21 (35) PCW = 26 (no prominent v waves) Fick cardiac output/index = 3.7/1.8 PVR = 2.4 SVR = 1458 FA sat = 96% PA sat = 48%, 49%  CPX 06/21/20 FVC 3.39 (82%)      FEV1 2.75 (85%)       Resting HR: 89 Peak HR: 162   (100% age predicted max HR)  BP rest: 116/20 BP peak: 196/90  Peak VO2: 16.5 (51% predicted peak VO2; corrected to ibw = 19.4 ml/kg/min) )  VE/VCO2 slope:  37  OUES: 1.56  Peak RER: 1.19  Ventilatory Threshold: 11.6 (36% predicted or measured peak VO2)  VE/MVV:  53%  PETCO2 at peak:  30  O2pulse:  11   (73% predicted O2pulse)   ROS: All systems negative except as listed in HPI, PMH and Problem List.  SH:  Social History   Socioeconomic History   Marital status: Married    Spouse name: Not on file   Number of children: Not on file   Years of education: Not on file   Highest education level: Not on file  Occupational History   Not on file  Tobacco Use   Smoking status: Former    Packs/day: 0.25    Years: 20.00    Pack years: 5.00    Types: Cigarettes    Quit date: 08/14/2019    Years since quitting: 1.5   Smokeless tobacco: Never   Tobacco  comments:    2 cig/ daily  Vaping Use   Vaping Use: Never used  Substance and Sexual Activity   Alcohol use: Not Currently    Alcohol/week: 2.0 standard drinks    Types: 1 Cans of beer, 1 Shots of liquor per week   Drug use: Not Currently   Sexual activity: Not on file  Other Topics Concern   Not on file  Social History Narrative   Not on file   Social Determinants of Health   Financial Resource Strain: High Risk   Difficulty of Paying Living Expenses: Very hard  Food Insecurity: No Food Insecurity   Worried About Charity fundraiser in the Last Year: Never true   Ran Out of Food in the Last Year: Never true  Transportation Needs: Unmet Transportation Needs   Lack of Transportation (Medical): Yes   Lack of Transportation (Non-Medical): No  Physical Activity: Not  on file  Stress: Not on file  Social Connections: Not on file  Intimate Partner Violence: Not on file   FH:  Family History  Problem Relation Age of Onset   Cancer Father    Heart disease Father    Past Medical History:  Diagnosis Date   CHF (congestive heart failure) (HCC)    Heart valve problem    Hypertension    Current Outpatient Medications  Medication Sig Dispense Refill   aspirin EC 81 MG tablet Take 1 tablet (81 mg total) by mouth daily. Swallow whole. Heart Failure Fund 30 tablet 5   atorvastatin (LIPITOR) 80 MG tablet Take 1 tablet (80 mg total) by mouth at bedtime. 30 tablet 2   benzocaine (ORAJEL) 10 % mucosal gel Use as directed 1 application in the mouth or throat as needed for mouth pain. 5.3 g 0   carvedilol (COREG) 6.25 MG tablet Take 1 tablet (6.25 mg total) by mouth 2 (two) times daily with a meal. 120 tablet 2   dapagliflozin propanediol (FARXIGA) 10 MG TABS tablet Take 1 tablet (10 mg total) by mouth daily. 30 tablet 11   furosemide (LASIX) 20 MG tablet Take 1 tablet (20 mg total) by mouth as needed for a 3 pound weight gain in 24 hours or 5 pounds in a week. 30 tablet 3   sacubitril-valsartan (ENTRESTO) 49-51 MG Take 1 tablet by mouth 2 (two) times daily. 60 tablet 2   spironolactone (ALDACTONE) 25 MG tablet Take 1 tablet (25 mg total) by mouth daily. HF Fund 30 tablet 3   No current facility-administered medications for this encounter.   BP 132/90    Pulse 73    Wt 109 kg (240 lb 6.4 oz)    SpO2 96%    BMI 33.53 kg/m   Wt Readings from Last 3 Encounters:  02/18/21 109 kg (240 lb 6.4 oz)  12/31/20 106.7 kg (235 lb 3.2 oz)  09/27/20 101.2 kg (223 lb 3.2 oz)   PHYSICAL EXAM: General:  NAD. No resp difficulty HEENT: Normal Neck: Supple. JVP 6-7. Carotids 2+ bilat; no bruits. No lymphadenopathy or thryomegaly appreciated. Cor: PMI nondisplaced. Regular rate & rhythm. No rubs, gallops or murmurs. Lungs: Clear Abdomen: Soft, nontender, nondistended. No  hepatosplenomegaly. No bruits or masses. Good bowel sounds. Extremities: No cyanosis, clubbing, rash, edema Neuro: Alert & oriented x 3, cranial nerves grossly intact. Moves all 4 extremities w/o difficulty. Affect pleasant.  ReDs: 36%  ECG (personally reviewed): NSR 62 bpm  ASSESSMENT & PLAN:  1. Chronic systolic HF - due to NICM (suspect  HTN). - diagnosed in [place] in 2018 - TEE 04/18/20: LVEF 30-35. RV mod HK. Severe biatrial enlargement. 4+ MR and 4+ TR due to annular dilation  - Cath 04/18/20: LM 20%, LAD 20% Ost LCX 60%. 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 - Ideally transplant w/u would be best option but not candidate with tobacco/cocaine use. No longer using. Last smoked in 01/2020.  Could consider VAD with TV ring. - Echo (8/22): EF 40-45%. No longer transplant/VAD candidate with improved EF. - NYHA II. Volume elevated today, 37%. - Increase Entresto to 97/103 mg bid. - I think he needs a daily Loop, take Lasix 20 mg daily + 20 KCl. - Continue carvedilol 6.25 bid.  - Continue spiro 25 mg daily. - Continue Farxiga 10 daily. - Instructed to limit fluids to less than 2 L or 64 oz a day. - I have asked him to weigh daily. - BMET today, repeat in 10-14 days.  2. CAD - Non-obstructive by cath 3/22 - No s/s ischemia. Continue with atypical chest pain that resolves with stretching/activity. ? MSK. - Continue ASA/statin.  3. HTN - Elevated today.  - Increase Entresto as above.   4. Tobacco/cocaine use - Says quit since 12/21.  5. Headache - Advised prn acetaminophen and BP check at home. - Needs to follow up with PCP, gave # to their clinic  Follow up with Dr. Haroldine Laws in 3 months as scheduled.  Rafael Bihari, FNP  10:27 AM

## 2021-02-14 ENCOUNTER — Other Ambulatory Visit (HOSPITAL_COMMUNITY): Payer: Self-pay | Admitting: Internal Medicine

## 2021-02-14 ENCOUNTER — Other Ambulatory Visit (HOSPITAL_COMMUNITY): Payer: Self-pay

## 2021-02-15 ENCOUNTER — Encounter (HOSPITAL_COMMUNITY): Payer: Medicaid Other | Admitting: Physical Therapy

## 2021-02-15 ENCOUNTER — Other Ambulatory Visit (HOSPITAL_COMMUNITY): Payer: Self-pay

## 2021-02-15 MED ORDER — CARVEDILOL 6.25 MG PO TABS
6.2500 mg | ORAL_TABLET | Freq: Two times a day (BID) | ORAL | 2 refills | Status: DC
  Filled 2021-02-15: qty 120, 60d supply, fill #0

## 2021-02-15 MED ORDER — ATORVASTATIN CALCIUM 80 MG PO TABS
80.0000 mg | ORAL_TABLET | Freq: Every day | ORAL | 2 refills | Status: DC
  Filled 2021-02-15: qty 30, 30d supply, fill #0

## 2021-02-18 ENCOUNTER — Encounter (HOSPITAL_COMMUNITY): Payer: Self-pay

## 2021-02-18 ENCOUNTER — Other Ambulatory Visit: Payer: Self-pay

## 2021-02-18 ENCOUNTER — Other Ambulatory Visit (HOSPITAL_COMMUNITY): Payer: Self-pay

## 2021-02-18 ENCOUNTER — Ambulatory Visit (HOSPITAL_COMMUNITY)
Admission: RE | Admit: 2021-02-18 | Discharge: 2021-02-18 | Disposition: A | Discharge status: Home / Self Care | Payer: Medicaid Other | Source: Ambulatory Visit | Attending: Family Medicine | Admitting: Family Medicine

## 2021-02-18 VITALS — BP 132/90 | HR 73 | Wt 240.4 lb

## 2021-02-18 DIAGNOSIS — I5042 Chronic combined systolic (congestive) and diastolic (congestive) heart failure: Secondary | ICD-10-CM | POA: Insufficient documentation

## 2021-02-18 DIAGNOSIS — I2510 Atherosclerotic heart disease of native coronary artery without angina pectoris: Secondary | ICD-10-CM | POA: Diagnosis not present

## 2021-02-18 DIAGNOSIS — Z8616 Personal history of COVID-19: Secondary | ICD-10-CM | POA: Insufficient documentation

## 2021-02-18 DIAGNOSIS — E119 Type 2 diabetes mellitus without complications: Secondary | ICD-10-CM | POA: Diagnosis not present

## 2021-02-18 DIAGNOSIS — G8929 Other chronic pain: Secondary | ICD-10-CM

## 2021-02-18 DIAGNOSIS — Z79899 Other long term (current) drug therapy: Secondary | ICD-10-CM | POA: Diagnosis not present

## 2021-02-18 DIAGNOSIS — I428 Other cardiomyopathies: Secondary | ICD-10-CM | POA: Diagnosis not present

## 2021-02-18 DIAGNOSIS — I5022 Chronic systolic (congestive) heart failure: Secondary | ICD-10-CM | POA: Diagnosis not present

## 2021-02-18 DIAGNOSIS — F1910 Other psychoactive substance abuse, uncomplicated: Secondary | ICD-10-CM

## 2021-02-18 DIAGNOSIS — R519 Headache, unspecified: Secondary | ICD-10-CM

## 2021-02-18 DIAGNOSIS — Z87891 Personal history of nicotine dependence: Secondary | ICD-10-CM | POA: Insufficient documentation

## 2021-02-18 DIAGNOSIS — F17200 Nicotine dependence, unspecified, uncomplicated: Secondary | ICD-10-CM

## 2021-02-18 DIAGNOSIS — I110 Hypertensive heart disease with heart failure: Secondary | ICD-10-CM | POA: Diagnosis not present

## 2021-02-18 DIAGNOSIS — I10 Essential (primary) hypertension: Secondary | ICD-10-CM

## 2021-02-18 LAB — BASIC METABOLIC PANEL
Anion gap: 7 (ref 5–15)
BUN: 12 mg/dL (ref 6–20)
CO2: 23 mmol/L (ref 22–32)
Calcium: 9.4 mg/dL (ref 8.9–10.3)
Chloride: 110 mmol/L (ref 98–111)
Creatinine, Ser: 1.08 mg/dL (ref 0.61–1.24)
GFR, Estimated: >60 mL/min (ref >60)
Glucose, Bld: 98 mg/dL (ref 70–99)
Potassium: 4.3 mmol/L (ref 3.5–5.1)
Sodium: 140 mmol/L (ref 135–145)

## 2021-02-18 MED ORDER — CARVEDILOL 6.25 MG PO TABS
6.2500 mg | ORAL_TABLET | Freq: Two times a day (BID) | ORAL | 4 refills | Status: DC
  Filled 2021-02-18 (×2): qty 60, 30d supply, fill #0

## 2021-02-18 MED ORDER — FUROSEMIDE 20 MG PO TABS
20.0000 mg | ORAL_TABLET | Freq: Every day | ORAL | 4 refills | Status: DC
  Filled 2021-02-18 (×2): qty 30, 30d supply, fill #0
  Filled 2021-03-18: qty 30, 30d supply, fill #1

## 2021-02-18 MED ORDER — ENTRESTO 97-103 MG PO TABS
1.0000 | ORAL_TABLET | Freq: Two times a day (BID) | ORAL | 4 refills | Status: DC
  Filled 2021-02-18 – 2021-03-18 (×3): qty 60, 30d supply, fill #0
  Filled 2021-05-02: qty 60, 30d supply, fill #1

## 2021-02-18 MED ORDER — SPIRONOLACTONE 25 MG PO TABS
25.0000 mg | ORAL_TABLET | Freq: Every day | ORAL | 4 refills | Status: DC
  Filled 2021-03-18: qty 30, 30d supply, fill #0
  Filled 2021-05-02: qty 30, 30d supply, fill #1

## 2021-02-18 MED ORDER — DAPAGLIFLOZIN PROPANEDIOL 10 MG PO TABS
10.0000 mg | ORAL_TABLET | Freq: Every day | ORAL | 4 refills | Status: DC
  Filled 2021-02-18 – 2021-05-02 (×2): qty 30, 30d supply, fill #0

## 2021-02-18 MED ORDER — POTASSIUM CHLORIDE CRYS ER 20 MEQ PO TBCR
20.0000 meq | EXTENDED_RELEASE_TABLET | Freq: Every day | ORAL | 4 refills | Status: DC
  Filled 2021-02-18 – 2021-05-02 (×2): qty 30, 30d supply, fill #0

## 2021-02-18 MED ORDER — ATORVASTATIN CALCIUM 80 MG PO TABS
80.0000 mg | ORAL_TABLET | Freq: Every day | ORAL | 4 refills | Status: DC
  Filled 2021-02-18 – 2021-03-18 (×2): qty 30, 30d supply, fill #0
  Filled 2021-05-02: qty 30, 30d supply, fill #1

## 2021-02-18 MED ORDER — ASPIRIN 81 MG PO TBEC
81.0000 mg | DELAYED_RELEASE_TABLET | Freq: Every day | ORAL | 4 refills | Status: DC
  Filled 2021-02-18: qty 30, 30d supply, fill #0

## 2021-02-18 NOTE — Progress Notes (Signed)
°   ReDS Vest / Clip - 02/18/21 1000       ReDS Vest / Clip   Station Marker D    Ruler Value 35    ReDS Value Range Moderate volume overload    ReDS Actual Value 36

## 2021-02-18 NOTE — Patient Instructions (Addendum)
Thank you for coming in today  Labs were done today, if any labs are abnormal the clinic will call you  START Lasix 20 mg 1 tablet daily  START Potassium 20 meq 1 tablet daily  INCREASE Entresto 97/103 1 tablet twice daily   Your physician recommends that you return for lab work in: 10-14 days you have been scheduled for this appointment 03/04/2021 at 1230 pm  Your provider today suggest you try miralax over the counter for constipation  PLEASE make appointment  with your primary care physician [phone]  At the [facility], you and your health needs are our priority. As part of our continuing mission to provide you with exceptional heart care, we have created designated Provider Care Teams. These Care Teams include your primary Cardiologist (physician) and Advanced Practice Providers (APPs- Physician Assistants and Nurse Practitioners) who all work together to provide you with the care you need, when you need it.   You may see any of the following providers on your designated Care Team at your next follow up: Dr Glori Bickers Dr Haynes Kerns, NP Lyda Jester, [facility] [place], [place] Audry Riles, PharmD   Please be sure to bring in all your medications bottles to every appointment.   If you have any questions or concerns before your next appointment please send [place] a message through [place] or call our office at [phone].    TO LEAVE A MESSAGE FOR THE NURSE SELECT OPTION 2, PLEASE LEAVE A MESSAGE INCLUDING: YOUR NAME DATE OF BIRTH CALL BACK NUMBER REASON FOR CALL**this is important as we prioritize the call backs  YOU WILL RECEIVE A CALL BACK THE SAME DAY AS LONG AS YOU CALL BEFORE 4:00 PM

## 2021-02-20 ENCOUNTER — Encounter (HOSPITAL_COMMUNITY): Payer: Medicaid Other | Admitting: Physical Therapy

## 2021-02-20 ENCOUNTER — Telehealth (HOSPITAL_COMMUNITY): Payer: Self-pay | Admitting: Physical Therapy

## 2021-02-20 NOTE — Telephone Encounter (Signed)
First no show:   Pt called he thought that his appointment was tomorrow.  Informed pt that his next appointment is on Friday at 2:45.  Rayetta Humphrey, [place] CLT [phone]

## 2021-02-21 ENCOUNTER — Ambulatory Visit (HOSPITAL_COMMUNITY): Payer: Medicaid Other | Admitting: Physical Therapy

## 2021-02-21 ENCOUNTER — Other Ambulatory Visit: Payer: Self-pay

## 2021-02-21 DIAGNOSIS — M545 Low back pain, unspecified: Secondary | ICD-10-CM | POA: Diagnosis not present

## 2021-02-21 DIAGNOSIS — R2689 Other abnormalities of gait and mobility: Secondary | ICD-10-CM

## 2021-02-21 NOTE — Therapy (Signed)
[facility] [address] [place], [place], [address] Phone: [phone]   Fax:  [phone]  Physical Therapy Treatment  Patient Details  Name: Devon Silva MRN: [id number] Date of Birth: [birth year] Referring Provider (PT): Gyarteng- Memory Argue MD   Encounter Date: 02/21/2021   PT End of Session - 02/21/21 1047     Visit Number 5    Number of Visits 12    Date for PT Re-Evaluation 03/11/21    Authorization Type [place] Medicaid (no auth 1st 12 visits)    Authorization - Visit Number 5    Authorization - Number of Visits 12    Progress Note Due on Visit 10    PT Start Time 1004    PT Stop Time 1048    PT Time Calculation (min) 44 min    Activity Tolerance Patient tolerated treatment well;Patient limited by pain    Behavior During Therapy [facility] for tasks assessed/performed             Past Medical History:  Diagnosis Date   CHF (congestive heart failure) (HCC)    Heart valve problem    Hypertension     Past Surgical History:  Procedure Laterality Date   RIGHT/LEFT HEART CATH AND CORONARY ANGIOGRAPHY N/A 04/18/2020   Procedure: RIGHT/LEFT HEART CATH AND CORONARY ANGIOGRAPHY;  Surgeon: Dolores Patty, MD;  [facility];  Service: Cardiovascular;  Laterality: N/A;   TEE WITHOUT CARDIOVERSION N/A 04/18/2020   Procedure: TRANSESOPHAGEAL ECHOCARDIOGRAM (TEE);  Surgeon: Dolores Patty, MD;  Location: [facility] ENDOSCOPY;  Service: Cardiovascular;  Laterality: N/A;    There were no vitals filed for this visit.   Subjective Assessment - 02/21/21 1052     Subjective pt states he feels he's gettign better overall.  States he stays sore but feeling like his motion is improving and stiffness is declining.  States he's still taking pain meds to help with pain.    Currently in Pain? Yes    Pain Score 6     Pain Location Back    Pain Orientation Right;Left;Lower    Pain Descriptors / Indicators Aching;Constant    Pain  Type Chronic pain                               [facility] Adult PT Treatment/Exercise - 02/21/21 0001       Lumbar Exercises: Stretches   Active Hamstring Stretch Right;Left;2 reps;20 seconds    Active Hamstring Stretch Limitations long sitting    Lower Trunk Rotation 5 reps;10 seconds    Lower Trunk Rotation Limitations hold for 2-3 deep breaths      Lumbar Exercises: Supine   Ab Set 10 reps    Glut Set 10 reps;5 seconds    Other Supine Lumbar Exercises Decompression 1-5  5x 3"      Lumbar Exercises: Prone   Other Prone Lumbar Exercises Diaphragmatic breathing x 1 min                     PT Education - 02/21/21 1045     Education Details updated HEP to include decompression 1-5 and worked on Advertising account planner for sit/supine    [employer]) Educated Patient    Methods Explanation;Demonstration;Tactile cues;Verbal cues;Handout    Comprehension Verbalized understanding;Returned demonstration;Verbal cues required;Tactile cues required              PT Short Term Goals - 01/28/21  1402       PT SHORT TERM GOAL #1   Title Patient will be independent with initial HEP and self-management strategies to improve functional outcomes    Time 3    Period Weeks    Status New    Target Date 02/18/21      PT SHORT TERM GOAL #2   Title Patient will report at least 30% overall improvement in subjective complaint to indicate improvement in ability to perform ADLs.    Time 3    Period Weeks    Status New    Target Date 02/18/21               PT Long Term Goals - 01/28/21 1403       PT LONG TERM GOAL #1   Title Patient will report at least 75% overall improvement in subjective complaint to indicate improvement in ability to perform ADLs.    Time 6    Period Weeks    Status New    Target Date 03/11/21      PT LONG TERM GOAL #2   Title Patient will improve lumbar AROM by 50% in all restricted planes for improved ability to perform functional  mobility tasks and ADLs.    Time 6    Period Weeks    Status New    Target Date 03/11/21      PT LONG TERM GOAL #3   Title Patient will be independent with advanced HEP and self-management strategies to improve functional outcomes    Time 6    Period Weeks    Status New    Target Date 03/11/21      PT LONG TERM GOAL #4   Title Patient will have equal to or > 4+/5 MMT throughout BLE to improve ability to perform functional mobility, stair ambulation and ADLs.    Time 6    Period Weeks    Status New    Target Date 03/11/21                   Plan - 02/21/21 1048     Clinical Impression Statement continued supine therex with use of moist heat to help ease the pain and discomfort.  Continued with education on logroll technique for transferring supine to/from sit .  Increased time to complete all mobility and exercises due to c/o pain and discomfort.  Able to complete decompression exericses and given written instructions to complete at home.  Pt also insstructed with long sitting hamstring stretch with noted tignthess bilaterally.  Pt reported no change in pain at end of session.    Personal Factors and Comorbidities Past/Current Experience;Time since onset of injury/illness/exacerbation    Examination-Activity Limitations Stairs;Squat;Sleep;Transfers;Bend;Locomotion Level;Lift;Stand;Sit    Stability/Clinical Decision Making Stable/Uncomplicated    Rehab Potential Fair    PT Frequency 2x / week    PT Duration 6 weeks    PT Treatment/Interventions ADLs/Self Care Home Management;Biofeedback;Cryotherapy;Fluidtherapy;Electrical Stimulation;Contrast Bath;Therapeutic exercise;Therapeutic activities;Patient/family education;Orthotic Fit/Training;Compression bandaging;Splinting;Taping;Vasopneumatic Device;Joint Manipulations;Spinal Manipulations;Energy conservation;Dry needling;Manual techniques;Functional mobility training;Traction;Ultrasound;Parrafin;Moist Heat;Stair  training;Neuromuscular re-education;Scar mobilization;Passive range of motion;Balance training;DME Instruction;Gait training;Iontophoresis 4mg /ml Dexamethasone;Vestibular;Visual/perceptual remediation/compensation    PT Next Visit Plan Continue with gentle mobility, LTR, isomterics, sidelying partial open books. Postural and core strengthening as tolerated. Diaphragm breathing.    PT Home Exercise Plan Eval: walking program (5-5min), SKTC, LTR, ab set; 02/13/21: decompression 1-5    Consulted and Agree with Plan of Care Patient  Patient will benefit from skilled therapeutic intervention in order to improve the following deficits and impairments:  Abnormal gait, Pain, Improper body mechanics, Increased fascial restricitons, Decreased mobility, Decreased activity tolerance, Decreased range of motion, Decreased strength, Hypomobility, Impaired perceived functional ability, Difficulty walking, Impaired flexibility  Visit Diagnosis: Low back pain, unspecified back pain laterality, unspecified chronicity, unspecified whether sciatica present  Other abnormalities of gait and mobility     Problem List Patient Active Problem List   Diagnosis Date Noted   Right heart failure (HCC) 04/04/2020   Leukocytosis 04/04/2020   Substance abuse (HCC) 04/04/2020   Acute congestive heart failure (HCC)    Acute on chronic combined systolic and diastolic CHF (congestive heart failure) (HCC) 04/03/2020   Acute respiratory failure with hypoxia [facility])    Essential hypertension 03/05/2019   Hyperlipidemia 03/05/2019   Tobacco use disorder 03/05/2019   Prediabetes 03/05/2019   Lurena Nida, PTA/CLT, WTA [phone]  Lurena Nida, PTA 02/21/2021, 10:53 AM  [facility] [address] [place], [place], [address] Phone: [phone]   Fax:  [phone]  Name: Devon Silva MRN: [id number] Date of Birth: 1960-04-20

## 2021-02-22 ENCOUNTER — Encounter (HOSPITAL_COMMUNITY): Payer: Medicaid Other | Admitting: Physical Therapy

## 2021-02-25 ENCOUNTER — Other Ambulatory Visit: Payer: Self-pay

## 2021-02-25 ENCOUNTER — Ambulatory Visit (HOSPITAL_COMMUNITY): Payer: Medicaid Other | Admitting: Physical Therapy

## 2021-02-25 DIAGNOSIS — M545 Low back pain, unspecified: Secondary | ICD-10-CM | POA: Diagnosis not present

## 2021-02-25 DIAGNOSIS — R2689 Other abnormalities of gait and mobility: Secondary | ICD-10-CM

## 2021-02-25 NOTE — Therapy (Signed)
[facility] [address] [place], [place], [address] Phone: [phone]   Fax:  [phone]  Physical Therapy Treatment  Patient Details  Name: Devon Silva MRN: [id number] Date of Birth: [birth year] Referring Provider (PT): Gyarteng- Memory Argue MD   Encounter Date: 02/25/2021   PT End of Session - 02/25/21 1135     Visit Number 6    Number of Visits 12    Date for PT Re-Evaluation 03/11/21    Authorization Type [place] Medicaid (no auth 1st 12 visits)    Authorization - Visit Number 6    Authorization - Number of Visits 12    Progress Note Due on Visit 10    PT Start Time 1048    PT Stop Time 1138    PT Time Calculation (min) 50 min    Activity Tolerance Patient tolerated treatment well;Patient limited by pain    Behavior During Therapy [facility] for tasks assessed/performed             Past Medical History:  Diagnosis Date   CHF (congestive heart failure) (HCC)    Heart valve problem    Hypertension     Past Surgical History:  Procedure Laterality Date   RIGHT/LEFT HEART CATH AND CORONARY ANGIOGRAPHY N/A 04/18/2020   Procedure: RIGHT/LEFT HEART CATH AND CORONARY ANGIOGRAPHY;  Surgeon: Dolores Patty, MD;  [facility];  Service: Cardiovascular;  Laterality: N/A;   TEE WITHOUT CARDIOVERSION N/A 04/18/2020   Procedure: TRANSESOPHAGEAL ECHOCARDIOGRAM (TEE);  Surgeon: Dolores Patty, MD;  Location: [facility] ENDOSCOPY;  Service: Cardiovascular;  Laterality: N/A;    There were no vitals filed for this visit.   Subjective Assessment - 02/25/21 1051     Subjective Pt states his pain is down a little at 7/10.    Currently in Pain? Yes    Pain Score 7     Pain Orientation Right;Left;Lower    Pain Descriptors / Indicators Aching;Sore                               [facility] Adult PT Treatment/Exercise - 02/25/21 0001       Lumbar Exercises: Stretches   Active Hamstring Stretch Right;Left;2  reps;20 seconds    Active Hamstring Stretch Limitations long sitting    Lower Trunk Rotation 5 reps;10 seconds    Lower Trunk Rotation Limitations hold for 2-3 deep breaths      Lumbar Exercises: Supine   Ab Set 15 reps;5 seconds    Glut Set 15 reps;5 seconds    Bridge 10 reps    Other Supine Lumbar Exercises Decompression 1-5  5x 3"    Other Supine Lumbar Exercises marching alternating with abdominal stabilization 10X      Modalities   Modalities Moist Heat   with supine exercises     Moist Heat Therapy   Moist Heat Location Lumbar Spine                       PT Short Term Goals - 01/28/21 1402       PT SHORT TERM GOAL #1   Title Patient will be independent with initial HEP and self-management strategies to improve functional outcomes    Time 3    Period Weeks    Status New    Target Date 02/18/21      PT SHORT TERM GOAL #2   Title Patient will report  at least 30% overall improvement in subjective complaint to indicate improvement in ability to perform ADLs.    Time 3    Period Weeks    Status New    Target Date 02/18/21               PT Long Term Goals - 01/28/21 1403       PT LONG TERM GOAL #1   Title Patient will report at least 75% overall improvement in subjective complaint to indicate improvement in ability to perform ADLs.    Time 6    Period Weeks    Status New    Target Date 03/11/21      PT LONG TERM GOAL #2   Title Patient will improve lumbar AROM by 50% in all restricted planes for improved ability to perform functional mobility tasks and ADLs.    Time 6    Period Weeks    Status New    Target Date 03/11/21      PT LONG TERM GOAL #3   Title Patient will be independent with advanced HEP and self-management strategies to improve functional outcomes    Time 6    Period Weeks    Status New    Target Date 03/11/21      PT LONG TERM GOAL #4   Title Patient will have equal to or > 4+/5 MMT throughout BLE to improve ability to  perform functional mobility, stair ambulation and ADLs.    Time 6    Period Weeks    Status New    Target Date 03/11/21                   Plan - 02/25/21 1137     Clinical Impression Statement Attempted rockerboard and flat surface standing heel and toe raises without ability to complete.  Side stepping with considerable LOB with cues to complete slowly and controlled.   Able to complete seated with noted challenge.  Pt required several seated rest breaks during session today due to mm fatigue.  Encouraged to increase compliance with HEP at home as this is the only way he will get stronger.  Pt verbalized understanding.    Personal Factors and Comorbidities Past/Current Experience;Time since onset of injury/illness/exacerbation    Examination-Activity Limitations Stairs;Squat;Sleep;Transfers;Bend;Locomotion Level;Lift;Stand;Sit    Stability/Clinical Decision Making Stable/Uncomplicated    Rehab Potential Fair    PT Frequency 2x / week    PT Duration 6 weeks    PT Treatment/Interventions ADLs/Self Care Home Management;Biofeedback;Cryotherapy;Fluidtherapy;Electrical Stimulation;Contrast Bath;Therapeutic exercise;Therapeutic activities;Patient/family education;Orthotic Fit/Training;Compression bandaging;Splinting;Taping;Vasopneumatic Device;Joint Manipulations;Spinal Manipulations;Energy conservation;Dry needling;Manual techniques;Functional mobility training;Traction;Ultrasound;Parrafin;Moist Heat;Stair training;Neuromuscular re-education;Scar mobilization;Passive range of motion;Balance training;DME Instruction;Gait training;Iontophoresis 4mg /ml Dexamethasone;Vestibular;Visual/perceptual remediation/compensation    PT Next Visit Plan Continue with gentle mobility  Progress with Postural and core strengthening as tolerated. Add theraband decompression exercises next session.    PT Home Exercise Plan Eval: walking program (5-53min), SKTC, LTR, ab set; 02/13/21: decompression 1-5    Consulted  and Agree with Plan of Care Patient             Patient will benefit from skilled therapeutic intervention in order to improve the following deficits and impairments:  Abnormal gait, Pain, Improper body mechanics, Increased fascial restricitons, Decreased mobility, Decreased activity tolerance, Decreased range of motion, Decreased strength, Hypomobility, Impaired perceived functional ability, Difficulty walking, Impaired flexibility  Visit Diagnosis: Low back pain, unspecified back pain laterality, unspecified chronicity, unspecified whether sciatica present  Other abnormalities of gait and mobility  Problem List Patient Active Problem List   Diagnosis Date Noted   Right heart failure (HCC) 04/04/2020   Leukocytosis 04/04/2020   Substance abuse (HCC) 04/04/2020   Acute congestive heart failure (HCC)    Acute on chronic combined systolic and diastolic CHF (congestive heart failure) (HCC) 04/03/2020   Acute respiratory failure with hypoxia [facility])    Essential hypertension 03/05/2019   Hyperlipidemia 03/05/2019   Tobacco use disorder 03/05/2019   Prediabetes 03/05/2019   Lurena Nida, PTA/CLT, WTA [phone]   Lurena Nida, PTA 02/25/2021, 11:44 AM  [facility] [address] [place], [place], [address] Phone: [phone]   Fax:  [phone]  Name: Leven Hoel MRN: [id number] Date of Birth: September 14, 1960

## 2021-02-26 ENCOUNTER — Other Ambulatory Visit (HOSPITAL_COMMUNITY): Payer: Self-pay

## 2021-02-27 ENCOUNTER — Ambulatory Visit (HOSPITAL_COMMUNITY): Payer: Medicaid Other

## 2021-02-27 ENCOUNTER — Other Ambulatory Visit: Payer: Self-pay

## 2021-02-27 DIAGNOSIS — M545 Low back pain, unspecified: Secondary | ICD-10-CM | POA: Diagnosis not present

## 2021-02-27 DIAGNOSIS — R2689 Other abnormalities of gait and mobility: Secondary | ICD-10-CM

## 2021-02-27 NOTE — Therapy (Signed)
[facility] [address] [place], [place], [address] Phone: [phone]   Fax:  [phone]  Physical Therapy Treatment  Patient Details  Name: Devon Silva MRN: [id number] Date of Birth: 04-Feb-1961 Referring Provider (PT): Gyarteng- Memory Argue MD   Encounter Date: 02/27/2021   PT End of Session - 02/27/21 0941     Visit Number 7    Number of Visits 12    Date for PT Re-Evaluation 03/11/21    Authorization Type [place] Medicaid (no auth 1st 12 visits)    Authorization - Visit Number 7    Authorization - Number of Visits 12    Progress Note Due on Visit 10    PT Start Time 0945    PT Stop Time 1030    PT Time Calculation (min) 45 min    Activity Tolerance Patient tolerated treatment well;Patient limited by pain    Behavior During Therapy [facility] for tasks assessed/performed             Past Medical History:  Diagnosis Date   CHF (congestive heart failure) (HCC)    Heart valve problem    Hypertension     Past Surgical History:  Procedure Laterality Date   RIGHT/LEFT HEART CATH AND CORONARY ANGIOGRAPHY N/A 04/18/2020   Procedure: RIGHT/LEFT HEART CATH AND CORONARY ANGIOGRAPHY;  Surgeon: Dolores Patty, MD;  [facility];  Service: Cardiovascular;  Laterality: N/A;   TEE WITHOUT CARDIOVERSION N/A 04/18/2020   Procedure: TRANSESOPHAGEAL ECHOCARDIOGRAM (TEE);  Surgeon: Dolores Patty, MD;  Location: [facility] ENDOSCOPY;  Service: Cardiovascular;  Laterality: N/A;    There were no vitals filed for this visit.   Subjective Assessment - 02/27/21 0949     Subjective Pt notes no change in symptoms. Reports continued difficulty with sit to stand transfers and limited bending over due to pain    Currently in Pain? Yes    Pain Score 7     Pain Location Back    Pain Orientation Right;Left;Lower    Pain Descriptors / Indicators Aching;Sore    Pain Type Chronic pain                [facility] PT Assessment - 02/27/21  0001       Assessment   Medical Diagnosis Lumbago    Referring Provider (PT) Gyarteng- Memory Argue MD                           [facility] Adult PT Treatment/Exercise - 02/27/21 0001       Bed Mobility   Bed Mobility Rolling Right;Rolling Left;Supine to Sit;Sit to Supine    Rolling Right Independent    Rolling Left Independent    Left Sidelying to Sit Independent    Supine to Sit Independent    Sit to Supine Independent   improved performance with log rolling     Lumbar Exercises: Stretches   Active Hamstring Stretch Right;Left   10x5 sec   Active Hamstring Stretch Limitations supine    Lower Trunk Rotation Other (comment)    Lower Trunk Rotation Limitations 2x60 sec      Lumbar Exercises: Aerobic   Nustep level 3 x 6 min 20 SPM   cues for larger amplitude. MHP along back during activity     Lumbar Exercises: Supine   Ab Set 15 reps;5 seconds    Glut Set 15 reps;5 seconds    Bridge 10 reps    [address]  Limitations only tolerates partial range                     PT Education - 02/27/21 1032     Education Details training in hip hinge and demo of body mechanics for spine safety with functional lifts/transfers/bed mobility    Person(s) Educated Patient    Methods Explanation;Demonstration    Comprehension Verbalized understanding              PT Short Term Goals - 01/28/21 1402       PT SHORT TERM GOAL #1   Title Patient will be independent with initial HEP and self-management strategies to improve functional outcomes    Time 3    Period Weeks    Status New    Target Date 02/18/21      PT SHORT TERM GOAL #2   Title Patient will report at least 30% overall improvement in subjective complaint to indicate improvement in ability to perform ADLs.    Time 3    Period Weeks    Status New    Target Date 02/18/21               PT Long Term Goals - 01/28/21 1403       PT LONG TERM GOAL #1   Title Patient will report at least 75%  overall improvement in subjective complaint to indicate improvement in ability to perform ADLs.    Time 6    Period Weeks    Status New    Target Date 03/11/21      PT LONG TERM GOAL #2   Title Patient will improve lumbar AROM by 50% in all restricted planes for improved ability to perform functional mobility tasks and ADLs.    Time 6    Period Weeks    Status New    Target Date 03/11/21      PT LONG TERM GOAL #3   Title Patient will be independent with advanced HEP and self-management strategies to improve functional outcomes    Time 6    Period Weeks    Status New    Target Date 03/11/21      PT LONG TERM GOAL #4   Title Patient will have equal to or > 4+/5 MMT throughout BLE to improve ability to perform functional mobility, stair ambulation and ADLs.    Time 6    Period Weeks    Status New    Target Date 03/11/21                   Plan - 02/27/21 1025     Clinical Impression Statement Continues to demonstrate limited range with hip flexion and lumbar flexion with guarded, slow movements in all aspects.  Completes bridges with partial range but demonstrates good abdominal contraction with isometric activities. Continued sessions indicated to progress lumbar ROM and core strength to improve activity tolerance    Personal Factors and Comorbidities Past/Current Experience;Time since onset of injury/illness/exacerbation    Examination-Activity Limitations Stairs;Squat;Sleep;Transfers;Bend;Locomotion Level;Lift;Stand;Sit    Stability/Clinical Decision Making Stable/Uncomplicated    Rehab Potential Fair    PT Frequency 2x / week    PT Duration 6 weeks    PT Treatment/Interventions ADLs/Self Care Home Management;Biofeedback;Cryotherapy;Fluidtherapy;Electrical Stimulation;Contrast Bath;Therapeutic exercise;Therapeutic activities;Patient/family education;Orthotic Fit/Training;Compression bandaging;Splinting;Taping;Vasopneumatic Device;Joint Manipulations;Spinal  Manipulations;Energy conservation;Dry needling;Manual techniques;Functional mobility training;Traction;Ultrasound;Parrafin;Moist Heat;Stair training;Neuromuscular re-education;Scar mobilization;Passive range of motion;Balance training;DME Instruction;Gait training;Iontophoresis 4mg /ml Dexamethasone;Vestibular;Visual/perceptual remediation/compensation    PT Next Visit Plan Continue with gentle mobility  Progress  with Postural and core strengthening as tolerated. Add theraband decompression exercises next session.    PT Home Exercise Plan Eval: walking program (5-60min), SKTC, LTR, ab set; 02/13/21: decompression 1-5    Consulted and Agree with Plan of Care Patient             Patient will benefit from skilled therapeutic intervention in order to improve the following deficits and impairments:  Abnormal gait, Pain, Improper body mechanics, Increased fascial restricitons, Decreased mobility, Decreased activity tolerance, Decreased range of motion, Decreased strength, Hypomobility, Impaired perceived functional ability, Difficulty walking, Impaired flexibility  Visit Diagnosis: Low back pain, unspecified back pain laterality, unspecified chronicity, unspecified whether sciatica present  Other abnormalities of gait and mobility     Problem List Patient Active Problem List   Diagnosis Date Noted   Right heart failure (HCC) 04/04/2020   Leukocytosis 04/04/2020   Substance abuse (HCC) 04/04/2020   Acute congestive heart failure (HCC)    Acute on chronic combined systolic and diastolic CHF (congestive heart failure) (HCC) 04/03/2020   Acute respiratory failure with hypoxia [facility])    Essential hypertension 03/05/2019   Hyperlipidemia 03/05/2019   Tobacco use disorder 03/05/2019   Prediabetes 03/05/2019    Dion Body, PT 02/27/2021, 10:33 AM   [facility] [address] [place], [place], [address] Phone: [phone]   Fax:  [phone]  Name:  Devon Silva MRN: [id number] Date of Birth: [birth year]

## 2021-03-04 ENCOUNTER — Ambulatory Visit (HOSPITAL_COMMUNITY): Payer: Medicaid Other

## 2021-03-04 ENCOUNTER — Ambulatory Visit (HOSPITAL_COMMUNITY)
Admission: RE | Admit: 2021-03-04 | Discharge: 2021-03-04 | Disposition: A | Discharge status: Home / Self Care | Payer: Medicaid Other | Source: Ambulatory Visit | Attending: Family Medicine | Admitting: Family Medicine

## 2021-03-04 ENCOUNTER — Other Ambulatory Visit: Payer: Self-pay

## 2021-03-04 DIAGNOSIS — R2689 Other abnormalities of gait and mobility: Secondary | ICD-10-CM

## 2021-03-04 DIAGNOSIS — M545 Low back pain, unspecified: Secondary | ICD-10-CM

## 2021-03-04 DIAGNOSIS — I5022 Chronic systolic (congestive) heart failure: Secondary | ICD-10-CM

## 2021-03-04 NOTE — Therapy (Signed)
[facility] [address] [place], [place], [address] Phone: [phone]   Fax:  [phone]  Physical Therapy Treatment  Patient Details  Name: Devon Silva MRN: [id number] Date of Birth: December 10, 1960 Referring Provider (PT): Gyarteng- Memory Argue MD   Encounter Date: 03/04/2021   PT End of Session - 03/04/21 1040     Visit Number 8    Number of Visits 12    Date for PT Re-Evaluation 03/11/21    Authorization Type [place] Medicaid (no auth 1st 12 visits)    Authorization - Visit Number 8    Authorization - Number of Visits 12    Progress Note Due on Visit 10    PT Start Time 1030    PT Stop Time 1115    PT Time Calculation (min) 45 min    Activity Tolerance Patient tolerated treatment well;Patient limited by pain    Behavior During Therapy [facility] for tasks assessed/performed             Past Medical History:  Diagnosis Date   CHF (congestive heart failure) (HCC)    Heart valve problem    Hypertension     Past Surgical History:  Procedure Laterality Date   RIGHT/LEFT HEART CATH AND CORONARY ANGIOGRAPHY N/A 04/18/2020   Procedure: RIGHT/LEFT HEART CATH AND CORONARY ANGIOGRAPHY;  Surgeon: Dolores Patty, MD;  Location: [facility] INVASIVE CV LAB;  Service: Cardiovascular;  Laterality: N/A;   TEE WITHOUT CARDIOVERSION N/A 04/18/2020   Procedure: TRANSESOPHAGEAL ECHOCARDIOGRAM (TEE);  Surgeon: Dolores Patty, MD;  Location: [facility] ENDOSCOPY;  Service: Cardiovascular;  Laterality: N/A;    There were no vitals filed for this visit.   Subjective Assessment - 03/04/21 1039     Subjective Reports worse back pain today when he twisted wrong in bed causing increased pain in usual area of lumabr spine    Currently in Pain? Yes    Pain Score 8     Pain Orientation Lower    Pain Descriptors / Indicators Sharp;Sore    Pain Type Chronic pain                [facility] PT Assessment - 03/04/21 0001       Assessment   Medical Diagnosis  Lumbago                           [facility] Adult PT Treatment/Exercise - 03/04/21 0001       Lumbar Exercises: Stretches   Other Lumbar Stretch Exercise forward flexion stretch 10x10 sec      Lumbar Exercises: Aerobic   Nustep level 3 x 6 min 20 SPM   MHP along back during activity. Emphasis on increased amplitude     Modalities   Modalities Moist Heat      Moist Heat Therapy   Moist Heat Location Lumbar Spine      Manual Therapy   Manual Therapy Soft tissue mobilization    Manual therapy comments completed separate from all other interventions    Soft tissue mobilization percussion gun massage to lumbar spine to reduce paraspinal, QL spasm. Decreased pain after procedure x 10 min                       PT Short Term Goals - 01/28/21 1402       PT SHORT TERM GOAL #1   Title Patient will be independent with initial HEP and self-management strategies to improve  functional outcomes    Time 3    Period Weeks    Status New    Target Date 02/18/21      PT SHORT TERM GOAL #2   Title Patient will report at least 30% overall improvement in subjective complaint to indicate improvement in ability to perform ADLs.    Time 3    Period Weeks    Status New    Target Date 02/18/21               PT Long Term Goals - 01/28/21 1403       PT LONG TERM GOAL #1   Title Patient will report at least 75% overall improvement in subjective complaint to indicate improvement in ability to perform ADLs.    Time 6    Period Weeks    Status New    Target Date 03/11/21      PT LONG TERM GOAL #2   Title Patient will improve lumbar AROM by 50% in all restricted planes for improved ability to perform functional mobility tasks and ADLs.    Time 6    Period Weeks    Status New    Target Date 03/11/21      PT LONG TERM GOAL #3   Title Patient will be independent with advanced HEP and self-management strategies to improve functional outcomes    Time 6    Period  Weeks    Status New    Target Date 03/11/21      PT LONG TERM GOAL #4   Title Patient will have equal to or > 4+/5 MMT throughout BLE to improve ability to perform functional mobility, stair ambulation and ADLs.    Time 6    Period Weeks    Status New    Target Date 03/11/21                   Plan - 03/04/21 1103     Clinical Impression Statement Pt with increased back pain after twisting his back this morning and presents with acute spasm in low back along paraspinal and QL with decreased pain following STM. Continued session to improve lumbar ROM and strength    Personal Factors and Comorbidities Past/Current Experience;Time since onset of injury/illness/exacerbation    Examination-Activity Limitations Stairs;Squat;Sleep;Transfers;Bend;Locomotion Level;Lift;Stand;Sit    Stability/Clinical Decision Making Stable/Uncomplicated    Rehab Potential Fair    PT Frequency 2x / week    PT Duration 6 weeks    PT Treatment/Interventions ADLs/Self Care Home Management;Biofeedback;Cryotherapy;Fluidtherapy;Electrical Stimulation;Contrast Bath;Therapeutic exercise;Therapeutic activities;Patient/family education;Orthotic Fit/Training;Compression bandaging;Splinting;Taping;Vasopneumatic Device;Joint Manipulations;Spinal Manipulations;Energy conservation;Dry needling;Manual techniques;Functional mobility training;Traction;Ultrasound;Parrafin;Moist Heat;Stair training;Neuromuscular re-education;Scar mobilization;Passive range of motion;Balance training;DME Instruction;Gait training;Iontophoresis 4mg /ml Dexamethasone;Vestibular;Visual/perceptual remediation/compensation    PT Next Visit Plan Continue with gentle mobility  Progress with Postural and core strengthening as tolerated. Add theraband decompression exercises next session.    PT Home Exercise Plan Eval: walking program (5-52min), SKTC, LTR, ab set; 02/13/21: decompression 1-5    Consulted and Agree with Plan of Care Patient              Patient will benefit from skilled therapeutic intervention in order to improve the following deficits and impairments:  Abnormal gait, Pain, Improper body mechanics, Increased fascial restricitons, Decreased mobility, Decreased activity tolerance, Decreased range of motion, Decreased strength, Hypomobility, Impaired perceived functional ability, Difficulty walking, Impaired flexibility  Visit Diagnosis: Low back pain, unspecified back pain laterality, unspecified chronicity, unspecified whether sciatica present  Other abnormalities of gait and mobility  Problem List Patient Active Problem List   Diagnosis Date Noted   Right heart failure ([place]) 04/04/2020   Leukocytosis 04/04/2020   Substance abuse ([place]) 04/04/2020   Acute congestive heart failure (HCC)    Acute on chronic combined systolic and diastolic CHF (congestive heart failure) ([place]) 04/03/2020   Acute respiratory failure with hypoxia [facility])    Essential hypertension 03/05/2019   Hyperlipidemia 03/05/2019   Tobacco use disorder 03/05/2019   Prediabetes 03/05/2019    Toniann Fail, PT 03/04/2021, 11:13 AM  [facility] [address] [place], [place], [address] Phone: [phone]   Fax:  [phone]  Name: Cruse Heinzerling MRN: [id number] Date of Birth: 1960/04/30

## 2021-03-05 ENCOUNTER — Other Ambulatory Visit (HOSPITAL_COMMUNITY): Payer: Self-pay

## 2021-03-06 ENCOUNTER — Encounter (HOSPITAL_COMMUNITY): Payer: Medicaid Other | Admitting: Physical Therapy

## 2021-03-06 ENCOUNTER — Telehealth (HOSPITAL_COMMUNITY): Payer: Self-pay | Admitting: Physical Therapy

## 2021-03-06 NOTE — Telephone Encounter (Signed)
Pt did not show for appt.  Called and left message regarding missed appt and to make aware that he has no further appt scheduled at this time.  Instructed to call and make another appointment if he wishes to return.  Lurena Nida, PTA/CLT, Margarita Rana [phone]

## 2021-03-13 ENCOUNTER — Other Ambulatory Visit (HOSPITAL_COMMUNITY): Payer: Self-pay

## 2021-03-16 ENCOUNTER — Emergency Department (HOSPITAL_COMMUNITY)
Admission: EM | Admit: 2021-03-16 | Discharge: 2021-03-16 | Disposition: A | Discharge status: Home / Self Care | Payer: Medicaid Other | Attending: Emergency Medicine | Admitting: Emergency Medicine

## 2021-03-16 ENCOUNTER — Encounter (HOSPITAL_COMMUNITY): Payer: Self-pay | Admitting: *Deleted

## 2021-03-16 DIAGNOSIS — I110 Hypertensive heart disease with heart failure: Secondary | ICD-10-CM | POA: Diagnosis not present

## 2021-03-16 DIAGNOSIS — U071 COVID-19: Secondary | ICD-10-CM | POA: Diagnosis not present

## 2021-03-16 DIAGNOSIS — Z7982 Long term (current) use of aspirin: Secondary | ICD-10-CM | POA: Insufficient documentation

## 2021-03-16 DIAGNOSIS — E119 Type 2 diabetes mellitus without complications: Secondary | ICD-10-CM | POA: Diagnosis not present

## 2021-03-16 DIAGNOSIS — Z79899 Other long term (current) drug therapy: Secondary | ICD-10-CM | POA: Insufficient documentation

## 2021-03-16 DIAGNOSIS — I509 Heart failure, unspecified: Secondary | ICD-10-CM | POA: Insufficient documentation

## 2021-03-16 DIAGNOSIS — Z20822 Contact with and (suspected) exposure to covid-19: Secondary | ICD-10-CM

## 2021-03-16 DIAGNOSIS — Z1152 Encounter for screening for COVID-19: Secondary | ICD-10-CM | POA: Diagnosis present

## 2021-03-16 LAB — RESP PANEL BY RT-PCR (FLU A&B, COVID) ARPGX2
Influenza A by PCR: NEGATIVE
Influenza B by PCR: NEGATIVE
SARS Coronavirus 2 by RT PCR: POSITIVE — AB

## 2021-03-16 MED ORDER — ACETAMINOPHEN 325 MG PO TABS: 650.0000 mg | ORAL_TABLET | Freq: Four times a day (QID) | ORAL | 1 refills | Status: DC | PRN

## 2021-03-16 MED ORDER — IBUPROFEN 400 MG PO TABS: 400.0000 mg | ORAL_TABLET | Freq: Four times a day (QID) | ORAL | 0 refills | Status: DC | PRN

## 2021-03-16 NOTE — ED Provider Notes (Signed)
[facility] EMERGENCY DEPARTMENT Provider Note   CSN: [id number] Arrival date & time: 03/16/21  1101     History  Chief Complaint  Patient presents with   covid esposure    Devon Silva is a 61 y.o. male.  HPI  Patient with past medical history of CHF, hypertension, diabetes presents due to COVID exposure.  Patient has no symptoms, his wife tested positive yesterday.  Patient is vaccinated and boosted.  No alleviating or aggravating factors since he is symptom-free.  Home Medications Prior to Admission medications   Medication Sig Start Date End Date Taking? Authorizing Provider  acetaminophen (TYLENOL) 325 MG tablet Take 2 tablets (650 mg total) by mouth every 6 (six) hours as needed. 03/16/21  Yes Theron Arista, PA-C  ibuprofen (ADVIL) 400 MG tablet Take 1 tablet (400 mg total) by mouth every 6 (six) hours as needed. 03/16/21  Yes Theron Arista, PA-C  aspirin 81 MG EC tablet Take 1 tablet (81 mg total) by mouth daily. 02/18/21   Milford, Anderson Malta, FNP  atorvastatin (LIPITOR) 80 MG tablet Take 1 tablet (80 mg total) by mouth at bedtime. 02/15/21   Bensimhon, Bevelyn Buckles, MD  atorvastatin (LIPITOR) 80 MG tablet Take 1 tablet (80 mg total) by mouth at bedtime. 02/18/21   Milford, Anderson Malta, FNP  benzocaine (ORAJEL) 10 % mucosal gel Use as directed 1 application in the mouth or throat as needed for mouth pain. 09/03/20   Sponseller, Lupe Carney R, PA-C  carvedilol (COREG) 6.25 MG tablet Take 1 tablet (6.25 mg total) by mouth 2 (two) times daily with a meal. 02/18/21   Milford, Anderson Malta, FNP  dapagliflozin propanediol (FARXIGA) 10 MG TABS tablet Take 1 tablet (10 mg total) by mouth daily. 02/18/21   Milford, Anderson Malta, FNP  furosemide (LASIX) 20 MG tablet Take 1 tablet (20 mg total) by mouth daily. 02/18/21   Milford, Anderson Malta, FNP  potassium chloride SA (KLOR-CON M) 20 MEQ tablet Take 1 tablet (20 mEq total) by mouth daily. 02/18/21   Milford, Anderson Malta, FNP  sacubitril-valsartan (ENTRESTO) 97-103 MG Take 1  tablet by mouth 2 (two) times daily. 02/18/21   Jacklynn Ganong, FNP  spironolactone (ALDACTONE) 25 MG tablet Take 1 tablet (25 mg total) by mouth daily. 02/18/21   Jacklynn Ganong, FNP      Allergies    Patient has no known allergies.    Review of Systems   Review of Systems  Physical Exam Updated Vital Signs BP (!) 146/98    Pulse 68    Temp 97.9 F (36.6 C) (Oral)    Resp 20    Wt 108.7 kg    SpO2 96%    BMI 33.42 kg/m  Physical Exam Vitals and nursing note reviewed. Exam conducted with a chaperone present.  Constitutional:      Appearance: Normal appearance.  HENT:     Head: Normocephalic.     Nose: No congestion.     Mouth/Throat:     Pharynx: No posterior oropharyngeal erythema.  Eyes:     Extraocular Movements: Extraocular movements intact.     Pupils: Pupils are equal, round, and reactive to light.  Cardiovascular:     Rate and Rhythm: Normal rate and regular rhythm.  Pulmonary:     Effort: Pulmonary effort is normal.     Breath sounds: Normal breath sounds.     Comments: Lungs CTA bilaterally. No accessory muscle use. Speaking in complete sentences.  Abdominal:     General:  Abdomen is flat.     Palpations: Abdomen is soft.  Musculoskeletal:     Cervical back: Normal range of motion.  Neurological:     Mental Status: He is alert.  Psychiatric:        Mood and Affect: Mood normal.    ED Results / Procedures / Treatments   Labs (all labs ordered are listed, but only abnormal results are displayed) Labs Reviewed  RESP PANEL BY RT-PCR (FLU A&B, COVID) [id number]    EKG None  Radiology No results found.  Procedures Procedures    Medications Ordered in ED Medications - No data to display  ED Course/ Medical Decision Making/ A&P                           Medical Decision Making Risk OTC drugs. Prescription drug management.   Patient's lungs are clear to auscultation bilaterally, he has hypertension vitals are otherwise unremarkable.  No hypoxia  or tachypnea, no chest pain or shortness of breath.  He had a COVID exposure, he is symptom-free.  We will COVID swab and discharge patient.  Do not feel he needs any additional work-up at this time.        Final Clinical Impression(s) / ED Diagnoses Final diagnoses:  Exposure to COVID-19 virus    Rx / DC Orders ED Discharge Orders          Ordered    ibuprofen (ADVIL) 400 MG tablet  Every 6 hours PRN        03/16/21 1126    acetaminophen (TYLENOL) 325 MG tablet  Every 6 hours PRN        03/16/21 1126              Theron Arista, PA-C 03/16/21 1128    Gloris Manchester, MD 03/16/21 1736

## 2021-03-16 NOTE — ED Triage Notes (Signed)
Requesting covid test, wife diagnosed with covid yesterday, denies symptoms

## 2021-03-16 NOTE — Discharge Instructions (Addendum)
Please sign up for my chart.  If you are positive for COVID this will alert you, the hospital may also call you if you are positive.  If you are positive you need to stay home for 5 days.  Take Tylenol and Motrin every 6 hours as needed for body aches or fever.  Follow-up with your primary in a week if needed for recheck.

## 2021-03-18 ENCOUNTER — Other Ambulatory Visit (HOSPITAL_COMMUNITY): Payer: Self-pay

## 2021-03-19 ENCOUNTER — Telehealth (HOSPITAL_COMMUNITY): Payer: Self-pay | Admitting: Licensed Clinical Social Worker

## 2021-03-19 NOTE — Telephone Encounter (Signed)
CSW received VM from pt requesting help finding PCP.  States that they cannot find one accepting new patients.  CSW informed pt he should call his insurance to get a list of in network providers in the area and call them for appt- if he has problems finding a provider after doing this then encouraged im to call me back  Jorge Ny, [place] Worker [facility] Desk#: [phone] Cell#: [phone]

## 2021-04-10 ENCOUNTER — Encounter (HOSPITAL_COMMUNITY): Payer: Self-pay | Admitting: Internal Medicine

## 2021-04-10 ENCOUNTER — Other Ambulatory Visit (HOSPITAL_COMMUNITY): Payer: Self-pay

## 2021-04-10 ENCOUNTER — Ambulatory Visit (HOSPITAL_COMMUNITY)
Admission: RE | Admit: 2021-04-10 | Discharge: 2021-04-10 | Disposition: A | Discharge status: Home / Self Care | Payer: Medicaid Other | Source: Ambulatory Visit | Attending: Internal Medicine | Admitting: Internal Medicine

## 2021-04-10 ENCOUNTER — Other Ambulatory Visit: Payer: Self-pay

## 2021-04-10 VITALS — BP 118/84 | HR 75 | Wt 245.4 lb

## 2021-04-10 DIAGNOSIS — I428 Other cardiomyopathies: Secondary | ICD-10-CM | POA: Insufficient documentation

## 2021-04-10 DIAGNOSIS — I10 Essential (primary) hypertension: Secondary | ICD-10-CM

## 2021-04-10 DIAGNOSIS — Z7984 Long term (current) use of oral hypoglycemic drugs: Secondary | ICD-10-CM | POA: Diagnosis not present

## 2021-04-10 DIAGNOSIS — E119 Type 2 diabetes mellitus without complications: Secondary | ICD-10-CM | POA: Diagnosis not present

## 2021-04-10 DIAGNOSIS — Z720 Tobacco use: Secondary | ICD-10-CM | POA: Insufficient documentation

## 2021-04-10 DIAGNOSIS — I2510 Atherosclerotic heart disease of native coronary artery without angina pectoris: Secondary | ICD-10-CM | POA: Insufficient documentation

## 2021-04-10 DIAGNOSIS — F17200 Nicotine dependence, unspecified, uncomplicated: Secondary | ICD-10-CM

## 2021-04-10 DIAGNOSIS — Z7982 Long term (current) use of aspirin: Secondary | ICD-10-CM | POA: Diagnosis not present

## 2021-04-10 DIAGNOSIS — I110 Hypertensive heart disease with heart failure: Secondary | ICD-10-CM | POA: Diagnosis not present

## 2021-04-10 DIAGNOSIS — Z79899 Other long term (current) drug therapy: Secondary | ICD-10-CM | POA: Diagnosis not present

## 2021-04-10 DIAGNOSIS — I5022 Chronic systolic (congestive) heart failure: Secondary | ICD-10-CM | POA: Diagnosis not present

## 2021-04-10 DIAGNOSIS — F1411 Cocaine abuse, in remission: Secondary | ICD-10-CM | POA: Insufficient documentation

## 2021-04-10 MED ORDER — FUROSEMIDE 40 MG PO TABS
40.0000 mg | ORAL_TABLET | Freq: Every day | ORAL | 6 refills | Status: DC
  Filled 2021-04-10 – 2021-05-02 (×2): qty 30, 30d supply, fill #0

## 2021-04-10 MED ORDER — CARVEDILOL 6.25 MG PO TABS
9.3750 mg | ORAL_TABLET | Freq: Two times a day (BID) | ORAL | 6 refills | Status: DC
  Filled 2021-04-10: qty 60, 20d supply, fill #0
  Filled 2021-05-02: qty 90, 30d supply, fill #0

## 2021-04-10 NOTE — Progress Notes (Signed)
? ?ADVANCED HF CLINIC NOTE ? ? ? ?PCP: Jacquelin Hawking, PA-C ?Primary Cardiologist: Dr. Shari Prows ?HF MD: Dr Gala Romney ? ?HPI: ?Devon Silva is a 61 y.o.. 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  ?  ?Diagnosed with systolic HF in [place] in 2018 in setting of severe HTN,  LHC revealed: Moderate 40% stenosis in the mid left circumflex; mild diffuse disease in the left and right coronary system ?  ?Moved to [place] in 2019, received most of his care at [facility] free clinic. Admitted 1/22 with COVID PNA.  ? ?Admitted 2/22 with HF. Echo 45-50% with global hypokinesis and grade 2 diastolic dysfunction, severe RV failure and dilation with severe pulmonary hypertension felt to possibly be secondary to left sided heart disease or longstanding tobacco abuse. Chest CT no PE.  ? ?Followed up in HF [facility] clinic and scheduled for TEE and R/L cath on 04/18/20 (see below).   Cath showed non-obs CAD, severe NICM with low output and moderately elevated filling pressures. CPX showed moderate limitation due to HF and body habitus. ? ?Echo (8/22): EF 40-45%. Mild MR/TR ? ?Today he returns for HF follow up. At last visit started on daily lasix. Says he feels a lot better. Can walk a full block now but gets SOB after that. Denies CP, orthopnea or PND. LE edema much improved. Compliant with meds. Has started back smoking 1-2 cigs/day. Drinking 2-3 beer on the weekend. Weight is up 6 pounds.  ? ? ?Cardiac Studies: ?- TEE 04/18/20: LVEF 30-35% RV mod HK. Severe biatrial enlargement. 4+ MR and 4+ TR due to annular dilation  ? ?- Cath 04/18/20: LM 20%, LAD 20% Ost LCX 60%. RCA 40%  ?Ao = 93/68 (82) ?LV = 88/15 ?RA = 14 ?RV = 58/12 ?PA = 50/21 (35) ?PCW = 26 (no prominent v waves) ?Fick cardiac output/index = 3.7/1.8 ?PVR = 2.4 ?SVR = 1458 ?FA sat = 96% ?PA sat = 48%, 49% ? ?CPX 06/21/20 ?FVC 3.39 (82%)      ?FEV1 2.75 (85%)       ?Resting HR: 89 Peak HR: 162    (100% age predicted max HR)  ?BP rest: 116/20 BP peak: 196/90  ?Peak VO2: 16.5 (51% predicted peak VO2; corrected to ibw = 19.4 ml/kg/min) )  ?VE/VCO2 slope:  37  ?OUES: 1.56  ?Peak RER: 1.19  ?Ventilatory Threshold: 11.6 (36% predicted or measured peak VO2)  ?VE/MVV:  53%  ?PETCO2 at peak:  30  ?O2pulse:  11   (73% predicted O2pulse)  ? ?ROS: All systems negative except as listed in HPI, PMH and Problem List. ? ?SH:  ?Social History  ? ?Socioeconomic History  ? Marital status: Married  ?  Spouse name: Not on file  ? Number of children: Not on file  ? Years of education: Not on file  ? Highest education level: Not on file  ?Occupational History  ? Not on file  ?Tobacco Use  ? Smoking status: Former  ?  Packs/day: 0.25  ?  Years: 20.00  ?  Pack years: 5.00  ?  Types: Cigarettes  ?  Quit date: 08/14/2019  ?  Years since quitting: 1.6  ? Smokeless tobacco: Never  ? Tobacco comments:  ?  2 cig/ daily  ?Vaping Use  ? Vaping Use: Never used  ?Substance and Sexual Activity  ? Alcohol use: Not Currently  ?  Alcohol/week: 2.0 standard drinks  ?  Types: 1 Cans  of beer, 1 Shots of liquor per week  ? Drug use: Not Currently  ? Sexual activity: Not on file  ?Other Topics Concern  ? Not on file  ?Social History Narrative  ? Not on file  ? ?Social Determinants of Health  ? ?Financial Resource Strain: Not on file  ?Food Insecurity: Not on file  ?Transportation Needs: Not on file  ?Physical Activity: Not on file  ?Stress: Not on file  ?Social Connections: Not on file  ?Intimate Partner Violence: Not on file  ? ?FH:  ?Family History  ?Problem Relation Age of Onset  ? Cancer Father   ? Heart disease Father   ? ?Past Medical History:  ?Diagnosis Date  ? CHF (congestive heart failure) (HCC)   ? Heart valve problem   ? Hypertension   ? ?Current Outpatient Medications  ?Medication Sig Dispense Refill  ? acetaminophen (TYLENOL) 325 MG tablet Take 2 tablets (650 mg total) by mouth every 6 (six) hours as needed. 30 tablet 1  ? aspirin 81 MG  EC tablet Take 1 tablet (81 mg total) by mouth daily. 30 tablet 4  ? atorvastatin (LIPITOR) 80 MG tablet Take 1 tablet (80 mg total) by mouth at bedtime. 30 tablet 4  ? carvedilol (COREG) 6.25 MG tablet Take 1 tablet (6.25 mg total) by mouth 2 (two) times daily with a meal. 60 tablet 4  ? dapagliflozin propanediol (FARXIGA) 10 MG TABS tablet Take 1 tablet (10 mg total) by mouth daily. 30 tablet 4  ? furosemide (LASIX) 20 MG tablet Take 1 tablet (20 mg total) by mouth daily. 30 tablet 4  ? ibuprofen (ADVIL) 400 MG tablet Take 1 tablet (400 mg total) by mouth every 6 (six) hours as needed. 30 tablet 0  ? Oxycodone HCl 10 MG TABS Take 10 mg by mouth every 6 (six) hours as needed.    ? potassium chloride SA (KLOR-CON M) 20 MEQ tablet Take 1 tablet (20 mEq total) by mouth daily. 30 tablet 4  ? sacubitril-valsartan (ENTRESTO) 97-103 MG Take 1 tablet by mouth 2 (two) times daily. 60 tablet 4  ? spironolactone (ALDACTONE) 25 MG tablet Take 1 tablet (25 mg total) by mouth daily. 30 tablet 4  ? tiZANidine (ZANAFLEX) 2 MG tablet Take 2 mg by mouth every 6 (six) hours as needed for muscle spasms.    ? ?No current facility-administered medications for this encounter.  ? ?BP 118/84   Pulse 75   Wt 111.3 kg (245 lb 6.4 oz)   SpO2 96%   BMI 34.23 kg/m?  ? ?Wt Readings from Last 3 Encounters:  ?04/10/21 111.3 kg (245 lb 6.4 oz)  ?03/16/21 108.7 kg (239 lb 10.2 oz)  ?02/18/21 109 kg (240 lb 6.4 oz)  ? ?PHYSICAL EXAM: ?General:  Well appearing. No resp difficulty ?HEENT: normal ?Neck: supple. no JVD. Carotids 2+ bilat; no bruits. No lymphadenopathy or thryomegaly appreciated. ?Cor: PMI nondisplaced. Regular rate & rhythm. No rubs, gallops or murmurs. ?Lungs: clear ?Abdomen: soft, nontender, nondistended. No hepatosplenomegaly. No bruits or masses. Good bowel sounds. ?Extremities: no cyanosis, clubbing, rash, edema ?Neuro: alert & orientedx3, cranial nerves grossly intact. moves all 4 extremities w/o difficulty. Affect  pleasant ? ? ?ASSESSMENT & PLAN: ? ?1. Chronic systolic HF ?- due to NICM (suspect HTN). ?- diagnosed in [place] in 2018 ?- TEE 04/18/20: LVEF 30-35. RV mod HK. Severe biatrial enlargement. 4+ MR and 4+ TR due to annular dilation  ?- Cath 04/18/20: LM 20%, LAD 20% Ost LCX 60%.  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 Mild MR/TR ?- Stable NYHA II-III ?- Continue Entresto to 97/103 mg bid. ?- Volume status up. ReDS 42%, Increase lasix to 40 daily. Watchintake ?- Increase carvedilol to 9.375 bid.  ?- Continue spiro 25 mg daily. ?- Continue Farxiga 10 daily. ?- Might be Barostim candidate ?- Recent bloodwork reviewed and was fine.  ? ?2. CAD ?- Non-obstructive by cath 3/22 ?- No s/s ischemia. Continue with atypical chest pain that resolves with stretching/activity. ? MSK. ?- Continue ASA/statin. ? ?3. HTN ?- Much improved  ?  ?4. Tobacco/cocaine use ?- Quit in 12/21. Now smoking a few cigs/week. Encouraged him to quit again  ? ? ?Arvilla Meres, MD  ?11:33 AM ?

## 2021-04-10 NOTE — Patient Instructions (Addendum)
Increase Carvedilol to 9.375 mg (1 & 1/2 tabs) Twice daily  ? ?Increase Furosemide to 40 mg Daily ? ?Your physician recommends that you schedule a follow-up appointment in: 3-4 months ? ?If you have any questions or concerns before your next appointment please send [place] a message through [place] or call our office at [phone].   ? ?TO LEAVE A MESSAGE FOR THE NURSE SELECT OPTION 2, PLEASE LEAVE A MESSAGE INCLUDING: ?YOUR NAME ?DATE OF BIRTH ?CALL BACK NUMBER ?REASON FOR CALL**this is important as we prioritize the call backs ? ?YOU WILL RECEIVE A CALL BACK THE SAME DAY AS LONG AS YOU CALL BEFORE 4:00 PM ? ?At the [facility], you and your health needs are our priority. As part of our continuing mission to provide you with exceptional heart care, we have created designated Provider Care Teams. These Care Teams include your primary Cardiologist (physician) and Advanced Practice Providers (APPs- Physician Assistants and Nurse Practitioners) who all work together to provide you with the care you need, when you need it.  ? ?You may see any of the following providers on your designated Care Team at your next follow up: ?Dr Arvilla Meres ?Dr Marca Ancona ?Tonye Becket, NP ?Robbie Lis, PA ?Jessica Milford,NP ?Anna Genre, PA ?Karle Plumber, PharmD ? ? ?Please be sure to bring in all your medications bottles to every appointment.  ? ? ?

## 2021-04-10 NOTE — Progress Notes (Signed)
ReDS Vest / Clip - 04/10/21 1200   ? ?  ? ReDS Vest / Clip  ? Station Marker D   ? Ruler Value 37   ? ReDS Value Range High volume overload   ? ReDS Actual Value 42   ? ?  ?  ? ?  ? ? ?

## 2021-04-18 ENCOUNTER — Other Ambulatory Visit (HOSPITAL_COMMUNITY): Payer: Self-pay

## 2021-05-02 ENCOUNTER — Other Ambulatory Visit (HOSPITAL_COMMUNITY): Payer: Self-pay

## 2021-05-10 ENCOUNTER — Other Ambulatory Visit (HOSPITAL_COMMUNITY): Payer: Self-pay

## 2021-05-13 ENCOUNTER — Other Ambulatory Visit (HOSPITAL_COMMUNITY): Payer: Self-pay

## 2021-05-15 ENCOUNTER — Other Ambulatory Visit (HOSPITAL_COMMUNITY): Payer: Self-pay

## 2021-05-28 ENCOUNTER — Emergency Department (HOSPITAL_COMMUNITY): Payer: Medicaid Other

## 2021-05-28 ENCOUNTER — Emergency Department (HOSPITAL_COMMUNITY)
Admission: EM | Admit: 2021-05-28 | Discharge: 2021-05-29 | Disposition: A | Discharge status: Home / Self Care | Payer: Medicaid Other | Attending: Emergency Medicine | Admitting: Emergency Medicine

## 2021-05-28 ENCOUNTER — Other Ambulatory Visit: Payer: Self-pay

## 2021-05-28 ENCOUNTER — Encounter (HOSPITAL_COMMUNITY): Payer: Self-pay | Admitting: Emergency Medicine

## 2021-05-28 DIAGNOSIS — I110 Hypertensive heart disease with heart failure: Secondary | ICD-10-CM | POA: Diagnosis not present

## 2021-05-28 DIAGNOSIS — Z7982 Long term (current) use of aspirin: Secondary | ICD-10-CM | POA: Insufficient documentation

## 2021-05-28 DIAGNOSIS — R079 Chest pain, unspecified: Secondary | ICD-10-CM | POA: Diagnosis not present

## 2021-05-28 DIAGNOSIS — R0602 Shortness of breath: Secondary | ICD-10-CM | POA: Diagnosis present

## 2021-05-28 DIAGNOSIS — D72829 Elevated white blood cell count, unspecified: Secondary | ICD-10-CM | POA: Insufficient documentation

## 2021-05-28 DIAGNOSIS — Z79899 Other long term (current) drug therapy: Secondary | ICD-10-CM | POA: Insufficient documentation

## 2021-05-28 DIAGNOSIS — I509 Heart failure, unspecified: Secondary | ICD-10-CM | POA: Insufficient documentation

## 2021-05-28 LAB — BASIC METABOLIC PANEL
Anion gap: 7 (ref 5–15)
BUN: 14 mg/dL (ref 6–20)
CO2: 21 mmol/L — ABNORMAL LOW (ref 22–32)
Calcium: 9.8 mg/dL (ref 8.9–10.3)
Chloride: 113 mmol/L — ABNORMAL HIGH (ref 98–111)
Creatinine, Ser: 1.24 mg/dL (ref 0.61–1.24)
GFR, Estimated: >60 mL/min (ref >60)
Glucose, Bld: 104 mg/dL — ABNORMAL HIGH (ref 70–99)
Potassium: 4 mmol/L (ref 3.5–5.1)
Sodium: 141 mmol/L (ref 135–145)

## 2021-05-28 LAB — CBC
HCT: 44.6 % (ref 39.0–52.0)
Hemoglobin: 15.7 g/dL (ref 13.0–17.0)
MCH: 27.4 pg (ref 26.0–34.0)
MCHC: 35.2 g/dL (ref 30.0–36.0)
MCV: 78 fL — ABNORMAL LOW (ref 80.0–100.0)
Platelets: 231 K/uL (ref 150–400)
RBC: 5.72 MIL/uL (ref 4.22–5.81)
RDW: 13.9 % (ref 11.5–15.5)
WBC: 11.9 K/uL — ABNORMAL HIGH (ref 4.0–10.5)
nRBC: 0 % (ref 0.0–0.2)

## 2021-05-28 LAB — RAPID URINE DRUG SCREEN, HOSP PERFORMED
Amphetamines: NOT DETECTED
Barbiturates: NOT DETECTED
Benzodiazepines: NOT DETECTED
Cocaine: POSITIVE — AB
Opiates: NOT DETECTED
Tetrahydrocannabinol: NOT DETECTED

## 2021-05-28 LAB — TROPONIN I (HIGH SENSITIVITY)
Troponin I (High Sensitivity): 5 ng/L (ref <18)
Troponin I (High Sensitivity): 5 ng/L

## 2021-05-28 LAB — BRAIN NATRIURETIC PEPTIDE: B Natriuretic Peptide: 33.8 pg/mL (ref 0.0–100.0)

## 2021-05-28 MED ORDER — POTASSIUM CHLORIDE CRYS ER 20 MEQ PO TBCR: 20.0000 meq | EXTENDED_RELEASE_TABLET | Freq: Every day | ORAL | 0 refills | Status: DC

## 2021-05-28 MED ORDER — FUROSEMIDE 40 MG PO TABS: 40.0000 mg | ORAL_TABLET | Freq: Every day | ORAL | 0 refills | Status: DC

## 2021-05-28 MED ORDER — ACETAMINOPHEN 325 MG PO TABS
650.0000 mg | ORAL_TABLET | Freq: Once | ORAL | Status: AC
  Administered 2021-05-28: 650 mg via ORAL

## 2021-05-28 MED ORDER — ACETAMINOPHEN 325 MG PO TABS
650.0000 mg | ORAL_TABLET | Freq: Once | ORAL | Status: DC
  Filled 2021-05-28: qty 2

## 2021-05-28 MED ORDER — CARVEDILOL 6.25 MG PO TABS: 9.3750 mg | ORAL_TABLET | Freq: Two times a day (BID) | ORAL | 0 refills | Status: DC

## 2021-05-28 MED ORDER — ASPIRIN 81 MG PO TBEC: 81.0000 mg | DELAYED_RELEASE_TABLET | Freq: Every day | ORAL | 0 refills | Status: DC

## 2021-05-28 MED ORDER — SPIRONOLACTONE 25 MG PO TABS: 25.0000 mg | ORAL_TABLET | Freq: Every day | ORAL | 0 refills | Status: DC

## 2021-05-28 MED ORDER — DAPAGLIFLOZIN PROPANEDIOL 10 MG PO TABS: 10.0000 mg | ORAL_TABLET | Freq: Every day | ORAL | 0 refills | Status: DC

## 2021-05-28 MED ORDER — ATORVASTATIN CALCIUM 80 MG PO TABS: 80.0000 mg | ORAL_TABLET | Freq: Every day | ORAL | 0 refills | Status: DC

## 2021-05-28 MED ORDER — ENTRESTO 97-103 MG PO TABS: 1.0000 | ORAL_TABLET | Freq: Two times a day (BID) | ORAL | 0 refills | Status: DC

## 2021-05-28 NOTE — ED Notes (Signed)
Patient complaining of chest pain rates a 9/10. EKG and given to Dr. Lynelle Doctor. JRPRN ?

## 2021-05-28 NOTE — ED Notes (Signed)
Patient was given water and a soda.  ?

## 2021-05-28 NOTE — ED Notes (Signed)
Patient was asked for urine again. He said he still can not go. I gave him more water and a soda. I did let him know that an in and out cath was an option and he said he would not let us do that. He said he will provide a sample in the next 30 minutes.  ?

## 2021-05-28 NOTE — ED Provider Triage Note (Signed)
Emergency Medicine Provider Triage Evaluation Note ? ?Devon Silva , a 61 y.o. male  was evaluated in triage.  Pt complains of shortness of breath since last night.  Patient has CHF, been seen by Dr. Adriana Reams most recently March.  Patient reports he has not taken his spironolactone since last night since he ran out.  Patient denies any lower extremity swelling.  Patient endorses slight chest pain.  Denies nausea or vomiting fevers. ? ?Review of Systems  ?Positive:  ?Negative:  ? ?Physical Exam  ?BP 120/90   Pulse 80   Temp 98 ?F (36.7 ?C) (Oral)   Resp (!) 29   Ht 5\' 11"  (1.803 m)   Wt 111.3 kg   SpO2 92%   BMI 34.22 kg/m?  ?Gen:   Awake, no distress  ?Resp:  Normal effort  ?MSK:   Moves extremities without difficulty  ?Other:  No pitting edema ? ?Medical Decision Making  ?Medically screening exam initiated at 6:13 PM.  Appropriate orders placed.  Aswad Wandrey was informed that the remainder of the evaluation will be completed by another provider, this initial triage assessment does not replace that evaluation, and the importance of remaining in the ED until their evaluation is complete. ? ? ?  ?[name], PA-C ?05/28/21 1813 ? ?

## 2021-05-28 NOTE — Discharge Instructions (Addendum)
Your workup was overall reassuring in the ED today. We have sent a urine sample to check for possibility of drugs. You can follow up on the results via [place].  ? ?A prescription of all of your home medications have been sent to  your pharmacy. Pick up and take as prescribed. You will need to follow up with both your PCP and Dr. Haroldine Laws for refills.  ? ?Return to the ED for any new/worsening symptoms  ?
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
## 2021-05-28 NOTE — ED Triage Notes (Signed)
Pt reports shortness of breath and chest pain starting a few hours ago. Pt reports he usually takes entresto and a diuretic for his heart failure, but he ran out of his medication today. Pt reports feelings of pins and needles in his left arm. ?

## 2021-05-28 NOTE — ED Notes (Signed)
Patient was asked for a urine sample. He said he can not go at this time. Urinal was placed at bedside.  ?

## 2021-06-05 ENCOUNTER — Other Ambulatory Visit: Payer: Self-pay | Admitting: Cardiology

## 2021-06-05 ENCOUNTER — Other Ambulatory Visit (HOSPITAL_COMMUNITY): Payer: Self-pay

## 2021-06-05 ENCOUNTER — Other Ambulatory Visit (HOSPITAL_COMMUNITY): Payer: Self-pay | Admitting: Family Medicine

## 2021-06-05 ENCOUNTER — Telehealth (HOSPITAL_COMMUNITY): Payer: Self-pay | Admitting: *Deleted

## 2021-06-05 ENCOUNTER — Other Ambulatory Visit (HOSPITAL_COMMUNITY): Payer: Self-pay | Admitting: Internal Medicine

## 2021-06-05 NOTE — Telephone Encounter (Signed)
Pt left vm requesting refills. I called pt back to get the name of refills pt did not answer I left detailed vm requesting return call.  ?

## 2021-06-07 ENCOUNTER — Other Ambulatory Visit (HOSPITAL_COMMUNITY): Payer: Self-pay | Admitting: *Deleted

## 2021-06-07 MED ORDER — FUROSEMIDE 40 MG PO TABS: 40.0000 mg | ORAL_TABLET | Freq: Every day | ORAL | 3 refills | Status: DC

## 2021-06-07 MED ORDER — SPIRONOLACTONE 25 MG PO TABS: 25.0000 mg | ORAL_TABLET | Freq: Every day | ORAL | 3 refills | Status: DC

## 2021-06-07 MED ORDER — POTASSIUM CHLORIDE CRYS ER 20 MEQ PO TBCR
20.0000 meq | EXTENDED_RELEASE_TABLET | Freq: Every day | ORAL | 3 refills | Status: DC
Start: 2021-06-07 — End: 2021-07-23

## 2021-06-07 MED ORDER — ENTRESTO 97-103 MG PO TABS: 1.0000 | ORAL_TABLET | Freq: Two times a day (BID) | ORAL | 3 refills | Status: DC

## 2021-06-07 MED ORDER — CARVEDILOL 6.25 MG PO TABS: 9.3750 mg | ORAL_TABLET | Freq: Two times a day (BID) | ORAL | 3 refills | Status: DC

## 2021-07-14 ENCOUNTER — Encounter (HOSPITAL_COMMUNITY): Payer: Self-pay

## 2021-07-14 ENCOUNTER — Emergency Department (HOSPITAL_COMMUNITY): Payer: Medicaid Other

## 2021-07-14 ENCOUNTER — Emergency Department (HOSPITAL_COMMUNITY)
Admission: EM | Admit: 2021-07-14 | Discharge: 2021-07-14 | Disposition: A | Discharge status: Home / Self Care | Payer: Medicaid Other | Attending: Emergency Medicine | Admitting: Emergency Medicine

## 2021-07-14 ENCOUNTER — Other Ambulatory Visit: Payer: Self-pay

## 2021-07-14 DIAGNOSIS — I509 Heart failure, unspecified: Secondary | ICD-10-CM | POA: Diagnosis not present

## 2021-07-14 DIAGNOSIS — M542 Cervicalgia: Secondary | ICD-10-CM | POA: Insufficient documentation

## 2021-07-14 DIAGNOSIS — M545 Low back pain, unspecified: Secondary | ICD-10-CM

## 2021-07-14 DIAGNOSIS — M546 Pain in thoracic spine: Secondary | ICD-10-CM | POA: Insufficient documentation

## 2021-07-14 DIAGNOSIS — I110 Hypertensive heart disease with heart failure: Secondary | ICD-10-CM | POA: Diagnosis not present

## 2021-07-14 DIAGNOSIS — Z7982 Long term (current) use of aspirin: Secondary | ICD-10-CM | POA: Insufficient documentation

## 2021-07-14 DIAGNOSIS — Z79899 Other long term (current) drug therapy: Secondary | ICD-10-CM | POA: Insufficient documentation

## 2021-07-14 DIAGNOSIS — M549 Dorsalgia, unspecified: Secondary | ICD-10-CM | POA: Diagnosis present

## 2021-07-14 MED ORDER — OXYCODONE HCL 5 MG PO TABS
10.0000 mg | ORAL_TABLET | Freq: Once | ORAL | Status: AC
  Administered 2021-07-14: 10 mg via ORAL
  Filled 2021-07-14: qty 2

## 2021-07-14 MED ORDER — HYDROMORPHONE HCL 1 MG/ML IJ SOLN
1.5000 mg | Freq: Once | INTRAMUSCULAR | Status: DC
  Filled 2021-07-14: qty 1.5

## 2021-07-14 MED ORDER — CYCLOBENZAPRINE HCL 10 MG PO TABS
10.0000 mg | ORAL_TABLET | Freq: Two times a day (BID) | ORAL | 0 refills | Status: DC | PRN
Start: 2021-07-14 — End: 2021-10-22
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
## 2021-07-14 NOTE — Discharge Instructions (Signed)
Back pain-started on a muscle laxer please take as prescribed, may take over-the-counter pain medication as needed please follow-up with the pain clinic. Thoracic aneurysm-I given a copy of this, please follow-up with your cardiologist/PCP for reassessment.  Come back to the emergency department if you develop chest pain, shortness of breath, severe abdominal pain, uncontrolled nausea, vomiting, diarrhea.

## 2021-07-14 NOTE — ED Triage Notes (Signed)
Pt reports chronic back pain since 2019. Pt receives tx at [facility]. Pt states he ran out of his oxycodone 10mg . Pt has a referral sheet that is requesting pt to have a lumbar XR.

## 2021-07-14 NOTE — ED Notes (Signed)
Pt goes to a pain clinic for pain and he has ran out of pain meds. Pt also has a referral sheet from a Doctor on 06/27/2021 to get x-rays done but he just now found time to get them done while he is here.

## 2021-07-23 ENCOUNTER — Ambulatory Visit (HOSPITAL_COMMUNITY)
Admission: RE | Admit: 2021-07-23 | Discharge: 2021-07-23 | Disposition: A | Discharge status: Home / Self Care | Payer: Medicaid Other | Source: Ambulatory Visit | Attending: Family Medicine | Admitting: Family Medicine

## 2021-07-23 ENCOUNTER — Encounter (HOSPITAL_COMMUNITY): Payer: Self-pay

## 2021-07-23 VITALS — BP 112/76 | HR 73 | Wt 228.2 lb

## 2021-07-23 DIAGNOSIS — Z7984 Long term (current) use of oral hypoglycemic drugs: Secondary | ICD-10-CM | POA: Diagnosis not present

## 2021-07-23 DIAGNOSIS — I110 Hypertensive heart disease with heart failure: Secondary | ICD-10-CM | POA: Insufficient documentation

## 2021-07-23 DIAGNOSIS — E119 Type 2 diabetes mellitus without complications: Secondary | ICD-10-CM | POA: Insufficient documentation

## 2021-07-23 DIAGNOSIS — I428 Other cardiomyopathies: Secondary | ICD-10-CM | POA: Diagnosis not present

## 2021-07-23 DIAGNOSIS — I10 Essential (primary) hypertension: Secondary | ICD-10-CM | POA: Diagnosis not present

## 2021-07-23 DIAGNOSIS — F17210 Nicotine dependence, cigarettes, uncomplicated: Secondary | ICD-10-CM | POA: Insufficient documentation

## 2021-07-23 DIAGNOSIS — E669 Obesity, unspecified: Secondary | ICD-10-CM | POA: Diagnosis not present

## 2021-07-23 DIAGNOSIS — Z5986 Financial insecurity: Secondary | ICD-10-CM | POA: Diagnosis not present

## 2021-07-23 DIAGNOSIS — F17200 Nicotine dependence, unspecified, uncomplicated: Secondary | ICD-10-CM

## 2021-07-23 DIAGNOSIS — Z8616 Personal history of COVID-19: Secondary | ICD-10-CM | POA: Insufficient documentation

## 2021-07-23 DIAGNOSIS — F1910 Other psychoactive substance abuse, uncomplicated: Secondary | ICD-10-CM

## 2021-07-23 DIAGNOSIS — I2510 Atherosclerotic heart disease of native coronary artery without angina pectoris: Secondary | ICD-10-CM | POA: Diagnosis not present

## 2021-07-23 DIAGNOSIS — Z7982 Long term (current) use of aspirin: Secondary | ICD-10-CM | POA: Diagnosis not present

## 2021-07-23 DIAGNOSIS — Z79899 Other long term (current) drug therapy: Secondary | ICD-10-CM | POA: Diagnosis not present

## 2021-07-23 DIAGNOSIS — I5022 Chronic systolic (congestive) heart failure: Secondary | ICD-10-CM | POA: Diagnosis not present

## 2021-07-23 LAB — BASIC METABOLIC PANEL
Anion gap: 7 (ref 5–15)
BUN: 12 mg/dL (ref 6–20)
CO2: 24 mmol/L (ref 22–32)
Calcium: 9.3 mg/dL (ref 8.9–10.3)
Chloride: 108 mmol/L (ref 98–111)
Creatinine, Ser: 1.1 mg/dL (ref 0.61–1.24)
GFR, Estimated: >60 mL/min (ref >60)
Glucose, Bld: 132 mg/dL — ABNORMAL HIGH (ref 70–99)
Potassium: 4.1 mmol/L (ref 3.5–5.1)
Sodium: 139 mmol/L (ref 135–145)

## 2021-07-23 MED ORDER — FUROSEMIDE 40 MG PO TABS: ORAL_TABLET | ORAL | 3 refills | Status: DC

## 2021-07-23 MED ORDER — POTASSIUM CHLORIDE CRYS ER 20 MEQ PO TBCR: EXTENDED_RELEASE_TABLET | ORAL | 3 refills | Status: DC

## 2021-07-23 NOTE — Patient Instructions (Signed)
INCREASE Lasix to 40 mg twice a day for 3 days, then decrease to 60 mg daily thereafter  INCREASE Potassium to 40 meq daily for 3 days, then decrease to 20 meq daily thereafter  Labs today We will only contact you if something comes back abnormal or we need to make some changes. Otherwise no news is good news!  Labs needed in 2 weeks  Please be sure to establish with PCP -see attached list of providers  Your physician recommends that you schedule a follow-up appointment in: 3-4 months with Dr Gala Romney  Do the following things EVERYDAY: Weigh yourself in the morning before breakfast. Write it down and keep it in a log. Take your medicines as prescribed Eat low salt foods--Limit salt (sodium) to 2000 mg per day.  Stay as active as you can everyday Limit all fluids for the day to less than 2 liters  At the [facility], you and your health needs are our priority. As part of our continuing mission to provide you with exceptional heart care, we have created designated Provider Care Teams. These Care Teams include your primary Cardiologist (physician) and Advanced Practice Providers (APPs- Physician Assistants and Nurse Practitioners) who all work together to provide you with the care you need, when you need it.   You may see any of the following providers on your designated Care Team at your next follow up: Dr Arvilla Meres Dr Carron Curie, NP Robbie Lis, [place] [facility] [place], [place] Karle Plumber, PharmD   Please be sure to bring in all your medications bottles to every appointment.

## 2021-07-23 NOTE — Progress Notes (Addendum)
Advanced Heart Failure Clinic Note    PCP: Pcp, No Primary Cardiologist: Dr. Shari Prows HF MD: Dr Gala Romney  HPI: Devon Silva is a 61 y.o. 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    Diagnosed with systolic HF in [place] in 2018 in setting of severe HTN,  LHC revealed: Moderate 40% stenosis in the mid left circumflex; mild diffuse disease in the left and right coronary system   Moved to [place] in 2019, received most of his care at [facility] free clinic. Admitted 1/22 with COVID PNA.   Admitted 2/22 with HF. Echo 45-50% with global hypokinesis and grade 2 diastolic dysfunction, severe RV failure and dilation with severe pulmonary hypertension felt to possibly be secondary to left sided heart disease or longstanding tobacco abuse. Chest CT no PE.   Followed up in HF [facility] clinic and scheduled for TEE and R/L cath on 04/18/20 (see below).   Cath showed non-obs CAD, severe NICM with low output and moderately elevated filling pressures. CPX showed moderate limitation due to HF and body habitus.  Echo (8/22): EF 40-45%. Mild MR/TR  Follow up 3/23, started back smoking and drinking occasionally. NYHA II-III, volume up, REDs 43%. Lasix increased to 40 daily.  Seen in ED 4/23 with CP and SOB, out off all meds, UDS + cocaine. Meds restarted.  Today he returns for HF follow up. Overall feeling ok. He is recently more SOB walking a block/going up steps and has dizziness with standing. Denies palpitations, CP, edema, or PND/Orthopnea. Appetite ok. No fever or chills. Weight at home 231 pounds. Taking all medications. Smoking 1 cigs/day, drinks a couple beers on weekends, last used cocaine a few weeks ago.  Cardiac Studies: - Echo (8/22): EF 40-45%, mild MR/TR  - TEE (3/22): LVEF 30-35% RV mod HK. Severe biatrial enlargement. 4+ MR and 4+ TR due to annular dilation   - Cath (3/22): LM 20%, LAD 20% Ost LCX  60%. RCA 40%  Ao = 93/68 (82) LV = 88/15 RA = 14 RV = 58/12 PA = 50/21 (35) PCW = 26 (no prominent v waves) Fick cardiac output/index = 3.7/1.8 PVR = 2.4 SVR = 1458 FA sat = 96% PA sat = 48%, 49%  - CPX 06/21/20 FVC 3.39 (82%)      FEV1 2.75 (85%)       Resting HR: 89 Peak HR: 162   (100% age predicted max HR)  BP rest: 116/20 BP peak: 196/90  Peak VO2: 16.5 (51% predicted peak VO2; corrected to ibw = 19.4 ml/kg/min) )  VE/VCO2 slope:  37  OUES: 1.56  Peak RER: 1.19  Ventilatory Threshold: 11.6 (36% predicted or measured peak VO2)  VE/MVV:  53%  PETCO2 at peak:  30  O2pulse:  11   (73% predicted O2pulse)   - Echo (2/22): EF 45-50%  ROS: All systems negative except as listed in HPI, PMH and Problem List.  SH:  Social History   Socioeconomic History   Marital status: Married    Spouse name: Not on file   Number of children: Not on file   Years of education: Not on file   Highest education level: Not on file  Occupational History   Not on file  Tobacco Use   Smoking status: Every Day    Packs/day: 0.25    Years: 20.00    Total pack years: 5.00    Types: Cigarettes   Smokeless tobacco: Never  Tobacco comments:    1 cig/ daily  Vaping Use   Vaping Use: Never used  Substance and Sexual Activity   Alcohol use: Not Currently    Alcohol/week: 2.0 standard drinks of alcohol    Types: 1 Cans of beer, 1 Shots of liquor per week   Drug use: Not Currently   Sexual activity: Not on file  Other Topics Concern   Not on file  Social History Narrative   Not on file   Social Determinants of Health   Financial Resource Strain: High Risk (04/06/2020)   Overall Financial Resource Strain (CARDIA)    Difficulty of Paying Living Expenses: Very hard  Food Insecurity: No Food Insecurity (04/06/2020)   Hunger Vital Sign    Worried About Running Out of Food in the Last Year: Never true    Ran Out of Food in the Last Year: Never true  Transportation Needs: Unmet Transportation  Needs (04/06/2020)   PRAPARE - Administrator, Civil Service (Medical): Yes    Lack of Transportation (Non-Medical): No  Physical Activity: Not on file  Stress: Not on file  Social Connections: Not on file  Intimate Partner Violence: Not on file   FH:  Family History  Problem Relation Age of Onset   Cancer Father    Heart disease Father    Past Medical History:  Diagnosis Date   CHF (congestive heart failure) (HCC)    Heart valve problem    Hypertension    Current Outpatient Medications  Medication Sig Dispense Refill   acetaminophen (TYLENOL) 325 MG tablet Take 2 tablets (650 mg total) by mouth every 6 (six) hours as needed. 30 tablet 1   aspirin 81 MG EC tablet Take 1 tablet (81 mg total) by mouth daily. 30 tablet 0   atorvastatin (LIPITOR) 80 MG tablet Take 1 tablet (80 mg total) by mouth at bedtime. 30 tablet 0   carvedilol (COREG) 6.25 MG tablet Take 1.5 tablets (9.375 mg total) by mouth 2 (two) times daily with a meal. 90 tablet 3   cyclobenzaprine (FLEXERIL) 10 MG tablet Take 1 tablet (10 mg total) by mouth 2 (two) times daily as needed for muscle spasms. 20 tablet 0   dapagliflozin propanediol (FARXIGA) 10 MG TABS tablet Take 1 tablet (10 mg total) by mouth daily. 30 tablet 0   furosemide (LASIX) 40 MG tablet Take 1 tablet (40 mg total) by mouth daily. 30 tablet 3   ibuprofen (ADVIL) 400 MG tablet Take 1 tablet (400 mg total) by mouth every 6 (six) hours as needed. 30 tablet 0   Oxycodone HCl 10 MG TABS Take 10 mg by mouth every 6 (six) hours as needed.     potassium chloride SA (KLOR-CON M) 20 MEQ tablet Take 1 tablet (20 mEq total) by mouth daily. 30 tablet 3   sacubitril-valsartan (ENTRESTO) 97-103 MG Take 1 tablet by mouth 2 (two) times daily. 60 tablet 3   spironolactone (ALDACTONE) 25 MG tablet Take 1 tablet (25 mg total) by mouth daily. 30 tablet 3   tiZANidine (ZANAFLEX) 2 MG tablet Take 2 mg by mouth every 6 (six) hours as needed for muscle spasms.      No current facility-administered medications for this encounter.   BP 112/76   Pulse 73   Wt 103.5 kg (228 lb 3.2 oz)   SpO2 95%   BMI 31.83 kg/m   Wt Readings from Last 3 Encounters:  07/23/21 103.5 kg (228 lb 3.2 oz)  07/14/21  106.6 kg (235 lb)  05/28/21 104.3 kg (230 lb)   PHYSICAL EXAM: General:  NAD. No resp difficulty HEENT: Normal Neck: Supple. JVP 7-8. Carotids 2+ bilat; no bruits. No lymphadenopathy or thryomegaly appreciated. Cor: PMI nondisplaced. Regular rate & rhythm. No rubs, gallops or murmurs. Lungs: Clear Abdomen: Soft, nontender, nondistended. No hepatosplenomegaly. No bruits or masses. Good bowel sounds. Extremities: No cyanosis, clubbing, rash, edema Neuro: Alert & oriented x 3, cranial nerves grossly intact. Moves all 4 extremities w/o difficulty. Affect pleasant.  ReDs: 44%  ASSESSMENT & PLAN: 1. Chronic systolic HF - due to NICM (suspect HTN). - diagnosed in [place] in 2018 - TEE 04/18/20: LVEF 30-35. RV mod HK. Severe biatrial enlargement. 4+ MR and 4+ TR due to annular dilation  - Cath 04/18/20: LM 20%, LAD 20% Ost LCX 60%. 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 Mild MR/TR - Worse NYHA III. Volume mildly up on exam, ReDs 44%. - Increase Lasix to 40 mg bid x 3 days and increase KCL to 40 daily x 3 days, then Lasix 60 mg daily + 20 KCL daily. - Continue Entresto 97/103 mg bid. - Continue carvedilol 9.375 mg bid.  - Continue spiro 25 mg daily. - Continue Farxiga 10 mg daily. - Might be Barostim candidate. - Limit fluids to < 2 L/day. - BMET today, repeat in 2 weeks.  2. CAD - Non-obstructive by cath 3/22. - No s/s ischemia.  - Continue ASA/statin.  3. HTN - Much improved. - GDMT as above.    4. Tobacco/cocaine  use - Quit in 12/21. Now smoking a few cigs/week.  - Encouraged him to quit again  - cocaine relapse a couple weeks ago, discussed resources in community. - He goes for addiction counseling 2x/week.  Follow up in 3-4 months with Dr. Gala Romney. He was given a list of PCPs today to establish care with.  Jacklynn Ganong, FNP  11:34 AM

## 2021-07-23 NOTE — Progress Notes (Signed)
ReDS Vest / Clip - 07/23/21 1100       ReDS Vest / Clip   Station Marker D    Ruler Value 32    ReDS Value Range High volume overload    ReDS Actual Value 44

## 2021-07-23 NOTE — Addendum Note (Signed)
Encounter addended by: Jacklynn Ganong, FNP on: 07/23/2021 12:21 PM  Actions taken: Clinical Note Signed

## 2021-08-06 ENCOUNTER — Ambulatory Visit (HOSPITAL_COMMUNITY)
Admission: RE | Admit: 2021-08-06 | Discharge: 2021-08-06 | Disposition: A | Discharge status: Home / Self Care | Payer: Medicaid Other | Source: Ambulatory Visit | Attending: Cardiology | Admitting: Cardiology

## 2021-08-06 DIAGNOSIS — I5022 Chronic systolic (congestive) heart failure: Secondary | ICD-10-CM | POA: Insufficient documentation

## 2021-08-06 LAB — BASIC METABOLIC PANEL
Anion gap: 8 (ref 5–15)
BUN: 17 mg/dL (ref 6–20)
CO2: 24 mmol/L (ref 22–32)
Calcium: 9.5 mg/dL (ref 8.9–10.3)
Chloride: 108 mmol/L (ref 98–111)
Creatinine, Ser: 1.19 mg/dL (ref 0.61–1.24)
GFR, Estimated: >60 mL/min (ref >60)
Glucose, Bld: 115 mg/dL — ABNORMAL HIGH (ref 70–99)
Potassium: 5.1 mmol/L (ref 3.5–5.1)
Sodium: 140 mmol/L (ref 135–145)

## 2021-09-09 ENCOUNTER — Encounter: Payer: Self-pay | Admitting: *Deleted

## 2021-09-15 ENCOUNTER — Encounter (HOSPITAL_COMMUNITY): Payer: Self-pay

## 2021-09-15 ENCOUNTER — Emergency Department (HOSPITAL_COMMUNITY)
Admission: EM | Admit: 2021-09-15 | Discharge: 2021-09-15 | Disposition: A | Discharge status: Home / Self Care | Payer: Medicaid Other | Attending: Emergency Medicine | Admitting: Emergency Medicine

## 2021-09-15 DIAGNOSIS — Z79899 Other long term (current) drug therapy: Secondary | ICD-10-CM | POA: Insufficient documentation

## 2021-09-15 DIAGNOSIS — F17200 Nicotine dependence, unspecified, uncomplicated: Secondary | ICD-10-CM | POA: Diagnosis not present

## 2021-09-15 DIAGNOSIS — I110 Hypertensive heart disease with heart failure: Secondary | ICD-10-CM | POA: Diagnosis not present

## 2021-09-15 DIAGNOSIS — E119 Type 2 diabetes mellitus without complications: Secondary | ICD-10-CM | POA: Diagnosis not present

## 2021-09-15 DIAGNOSIS — Z7984 Long term (current) use of oral hypoglycemic drugs: Secondary | ICD-10-CM | POA: Insufficient documentation

## 2021-09-15 DIAGNOSIS — Z7982 Long term (current) use of aspirin: Secondary | ICD-10-CM | POA: Diagnosis not present

## 2021-09-15 DIAGNOSIS — B9689 Other specified bacterial agents as the cause of diseases classified elsewhere: Secondary | ICD-10-CM | POA: Diagnosis not present

## 2021-09-15 DIAGNOSIS — N390 Urinary tract infection, site not specified: Secondary | ICD-10-CM | POA: Insufficient documentation

## 2021-09-15 DIAGNOSIS — I509 Heart failure, unspecified: Secondary | ICD-10-CM | POA: Diagnosis not present

## 2021-09-15 DIAGNOSIS — R339 Retention of urine, unspecified: Secondary | ICD-10-CM | POA: Diagnosis not present

## 2021-09-15 DIAGNOSIS — R109 Unspecified abdominal pain: Secondary | ICD-10-CM | POA: Diagnosis present

## 2021-09-15 LAB — CBC WITH DIFFERENTIAL/PLATELET
Abs Immature Granulocytes: 0.05 10*3/uL (ref 0.00–0.07)
Basophils Absolute: 0.1 10*3/uL (ref 0.0–0.1)
Basophils Relative: 0 %
Eosinophils Absolute: 0.2 10*3/uL (ref 0.0–0.5)
Eosinophils Relative: 1 %
HCT: 42.6 % (ref 39.0–52.0)
Hemoglobin: 15 g/dL (ref 13.0–17.0)
Immature Granulocytes: 0 %
Lymphocytes Relative: 14 %
Lymphs Abs: 2.1 10*3/uL (ref 0.7–4.0)
MCH: 26.9 pg (ref 26.0–34.0)
MCHC: 35.2 g/dL (ref 30.0–36.0)
MCV: 76.5 fL — ABNORMAL LOW (ref 80.0–100.0)
Monocytes Absolute: 1.1 10*3/uL — ABNORMAL HIGH (ref 0.1–1.0)
Monocytes Relative: 8 %
Neutro Abs: 11 10*3/uL — ABNORMAL HIGH (ref 1.7–7.7)
Neutrophils Relative %: 77 %
Platelets: 244 10*3/uL (ref 150–400)
RBC: 5.57 MIL/uL (ref 4.22–5.81)
RDW: 14.1 % (ref 11.5–15.5)
WBC: 14.4 10*3/uL — ABNORMAL HIGH (ref 4.0–10.5)
nRBC: 0 % (ref 0.0–0.2)

## 2021-09-15 LAB — URINALYSIS, ROUTINE W REFLEX MICROSCOPIC
Bilirubin Urine: NEGATIVE
Glucose, UA: NEGATIVE mg/dL
Ketones, ur: NEGATIVE mg/dL
Nitrite: NEGATIVE
Protein, ur: >=300 mg/dL — AB
RBC / HPF: >50 RBC/hpf — ABNORMAL HIGH (ref 0–5)
Specific Gravity, Urine: 1.019 (ref 1.005–1.030)
WBC, UA: >50 WBC/hpf — ABNORMAL HIGH (ref 0–5)
pH: 5 (ref 5.0–8.0)

## 2021-09-15 LAB — BASIC METABOLIC PANEL
Anion gap: 7 (ref 5–15)
BUN: 21 mg/dL — ABNORMAL HIGH (ref 6–20)
CO2: 23 mmol/L (ref 22–32)
Calcium: 9.5 mg/dL (ref 8.9–10.3)
Chloride: 109 mmol/L (ref 98–111)
Creatinine, Ser: 1.35 mg/dL — ABNORMAL HIGH (ref 0.61–1.24)
GFR, Estimated: >60 mL/min (ref >60)
Glucose, Bld: 104 mg/dL — ABNORMAL HIGH (ref 70–99)
Potassium: 3.7 mmol/L (ref 3.5–5.1)
Sodium: 139 mmol/L (ref 135–145)

## 2021-09-15 MED ORDER — MORPHINE SULFATE (PF) 4 MG/ML IV SOLN
6.0000 mg | Freq: Once | INTRAVENOUS | Status: AC
  Administered 2021-09-15: 6 mg via INTRAMUSCULAR
  Filled 2021-09-15: qty 2

## 2021-09-15 MED ORDER — CEPHALEXIN 500 MG PO CAPS: 500.0000 mg | ORAL_CAPSULE | Freq: Two times a day (BID) | ORAL | 0 refills | Status: DC

## 2021-09-15 MED ORDER — SODIUM CHLORIDE 0.9 % IV SOLN
1.0000 g | Freq: Once | INTRAVENOUS | Status: AC
  Administered 2021-09-15: 1 g via INTRAVENOUS
  Filled 2021-09-15: qty 10

## 2021-09-15 MED ORDER — TAMSULOSIN HCL 0.4 MG PO CAPS: 0.4000 mg | ORAL_CAPSULE | Freq: Every day | ORAL | 0 refills | Status: AC

## 2021-09-15 NOTE — ED Notes (Signed)
Pt has urinal at bedside and was told to collect sample then pt ambulated to restroom and did not collect a sample . Devon Silva

## 2021-09-15 NOTE — ED Provider Notes (Signed)
[facility] EMERGENCY DEPARTMENT Provider Note   CSN: [id number] Arrival date & time: 09/15/21  1834     History  Chief Complaint  Patient presents with   Dysuria    Devon Silva is a 61 y.o. male.  Patient is a 61 year old male with a history of hypertension, diabetes, CHF, chronic back pain, tobacco and cocaine use who presents with abdominal pain.  He has some 3-day history of pain in his suprapubic area associated with some difficulty urinating for the last 3 days.  He says he can only urinate small amounts at a time and it hurts when he urinates.  He denies any fevers.  No nausea or vomiting.  No associated back pain or other abdominal pain.  No history of similar symptoms in the past.       Home Medications Prior to Admission medications   Medication Sig Start Date End Date Taking? Authorizing Provider  cephALEXin (KEFLEX) 500 MG capsule Take 1 capsule (500 mg total) by mouth 2 (two) times daily. 09/15/21  Yes Rolan Bucco, MD  tamsulosin (FLOMAX) 0.4 MG CAPS capsule Take 1 capsule (0.4 mg total) by mouth daily. 09/15/21  Yes Rolan Bucco, MD  acetaminophen (TYLENOL) 325 MG tablet Take 2 tablets (650 mg total) by mouth every 6 (six) hours as needed. 03/16/21   Theron Arista, PA-C  aspirin 81 MG EC tablet Take 1 tablet (81 mg total) by mouth daily. 05/28/21   Hyman Hopes, Margaux, PA-C  atorvastatin (LIPITOR) 80 MG tablet Take 1 tablet (80 mg total) by mouth at bedtime. 05/28/21   Hyman Hopes, Margaux, PA-C  carvedilol (COREG) 6.25 MG tablet Take 1.5 tablets (9.375 mg total) by mouth 2 (two) times daily with a meal. 06/07/21   Bensimhon, Bevelyn Buckles, MD  cyclobenzaprine (FLEXERIL) 10 MG tablet Take 1 tablet (10 mg total) by mouth 2 (two) times daily as needed for muscle spasms. 07/14/21   Carroll Sage, PA-C  dapagliflozin propanediol (FARXIGA) 10 MG TABS tablet Take 1 tablet (10 mg total) by mouth daily. 05/28/21   Hyman Hopes, Margaux, PA-C  furosemide (LASIX) 40 MG tablet Take 1 tablet (40 mg  total) by mouth 2 (two) times daily for 3 days, THEN 1.5 tablets (60 mg total) daily. 07/23/21 10/24/21  Jacklynn Ganong, FNP  ibuprofen (ADVIL) 400 MG tablet Take 1 tablet (400 mg total) by mouth every 6 (six) hours as needed. 03/16/21   Theron Arista, PA-C  Oxycodone HCl 10 MG TABS Take 10 mg by mouth every 6 (six) hours as needed. 04/04/21   [provider]  potassium chloride SA (KLOR-CON M) 20 MEQ tablet Take 2 tablets (40 mEq total) by mouth daily for 3 days, THEN 1 tablet (20 mEq total) daily. 07/23/21 10/24/21  Jacklynn Ganong, FNP  sacubitril-valsartan (ENTRESTO) 97-103 MG Take 1 tablet by mouth 2 (two) times daily. 06/07/21   Bensimhon, Bevelyn Buckles, MD  spironolactone (ALDACTONE) 25 MG tablet Take 1 tablet (25 mg total) by mouth daily. 06/07/21   Bensimhon, Bevelyn Buckles, MD  tiZANidine (ZANAFLEX) 2 MG tablet Take 2 mg by mouth every 6 (six) hours as needed for muscle spasms. 01/26/18   [provider]      Allergies    Patient has no known allergies.    Review of Systems   Review of Systems  Constitutional:  Negative for chills, diaphoresis, fatigue and fever.  HENT:  Negative for congestion, rhinorrhea and sneezing.   Eyes: Negative.   Respiratory:  Negative for cough, chest tightness  and shortness of breath.   Cardiovascular:  Negative for chest pain and leg swelling.  Gastrointestinal:  Positive for abdominal pain. Negative for blood in stool, diarrhea, nausea and vomiting.  Genitourinary:  Positive for difficulty urinating. Negative for flank pain, frequency and hematuria.  Musculoskeletal:  Negative for arthralgias and back pain.  Skin:  Negative for rash.  Neurological:  Negative for dizziness, speech difficulty, weakness, numbness and headaches.    Physical Exam Updated Vital Signs BP 130/81   Pulse 76   Temp 98.6 F (37 C) (Oral)   Resp 20   Ht 5' 10.5" (1.791 m)   Wt 102.5 kg   SpO2 95%   BMI 31.97 kg/m  Physical Exam Constitutional:      Appearance:  He is well-developed.  HENT:     Head: Normocephalic and atraumatic.  Eyes:     Pupils: Pupils are equal, round, and reactive to light.  Cardiovascular:     Rate and Rhythm: Normal rate and regular rhythm.     Heart sounds: Normal heart sounds.  Pulmonary:     Effort: Pulmonary effort is normal. No respiratory distress.     Breath sounds: Normal breath sounds. No wheezing or rales.  Chest:     Chest wall: No tenderness.  Abdominal:     General: Bowel sounds are normal.     Palpations: Abdomen is soft.     Tenderness: There is abdominal tenderness (Tenderness to the suprapubic area). There is no guarding or rebound.  Musculoskeletal:        General: Normal range of motion.     Cervical back: Normal range of motion and neck supple.  Lymphadenopathy:     Cervical: No cervical adenopathy.  Skin:    General: Skin is warm and dry.     Findings: No rash.  Neurological:     Mental Status: He is alert and oriented to person, place, and time.     ED Results / Procedures / Treatments   Labs (all labs ordered are listed, but only abnormal results are displayed) Labs Reviewed  BASIC METABOLIC PANEL - Abnormal; Notable for the following components:      Result Value   Glucose, Bld 104 (*)    BUN 21 (*)    Creatinine, Ser 1.35 (*)    All other components within normal limits  CBC WITH DIFFERENTIAL/PLATELET - Abnormal; Notable for the following components:   WBC 14.4 (*)    MCV 76.5 (*)    Neutro Abs 11.0 (*)    Monocytes Absolute 1.1 (*)    All other components within normal limits  URINALYSIS, ROUTINE W REFLEX MICROSCOPIC - Abnormal; Notable for the following components:   Color, Urine AMBER (*)    APPearance TURBID (*)    Hgb urine dipstick SMALL (*)    Protein, ur >=300 (*)    Leukocytes,Ua LARGE (*)    RBC / HPF >50 (*)    WBC, UA >50 (*)    Bacteria, UA RARE (*)    Non Squamous Epithelial 0-5 (*)    All other components within normal limits  URINE CULTURE     EKG None  Radiology No results found.  Procedures Procedures    Medications Ordered in ED Medications  morphine (PF) 4 MG/ML injection 6 mg (6 mg Intramuscular Given 09/15/21 2056)  cefTRIAXone (ROCEPHIN) 1 g in sodium chloride 0.9 % 100 mL IVPB (1 g Intravenous New Bag/Given 09/15/21 2212)    ED Course/ Medical Decision Making/ A&P  Medical Decision Making Amount and/or Complexity of Data Reviewed Labs: ordered.  Risk Prescription drug management.   Patient is a 61 year old who presents with lower abdominal pain with difficulty urinating.  Bladder scan revealed over 500 cc of urine.  Foley catheter was placed.  He had large amount of blood-tinged cloudy urine return.  His pain improved after the catheter was placed.  Urinalysis is consistent with infection.  His creatinine is mildly elevated compared to prior values.  His WBC count is elevated.  He is afebrile.  He otherwise does not appear systemically ill.  No clinical suggestions of sepsis.  At this point I feel he can be managed as an outpatient.  The Foley catheter was left in place.  We will give him a leg bag.  We will place him on antibiotics.  He was given a dose of IV Rocephin in the ED.  His urine was sent for culture.  We will start him on Keflex.  We will also start him on Flomax.  He was given a referral to follow-up with urology here in [place].  He was advised to call tomorrow to make an appointment this week with either the urologist or his primary care doctor.  He was advised that he will need to have his Foley catheter removed in about a week.  He was advised that his creatinine needs to be rechecked by his primary care doctor.  Return precautions were given.  Final Clinical Impression(s) / ED Diagnoses Final diagnoses:  Lower urinary tract infectious disease  Urinary retention    Rx / DC Orders ED Discharge Orders          Ordered    tamsulosin (FLOMAX) 0.4 MG CAPS capsule   Daily        09/15/21 2259    cephALEXin (KEFLEX) 500 MG capsule  2 times daily        09/15/21 2259              Rolan Bucco, MD 09/15/21 2310

## 2021-09-15 NOTE — ED Notes (Signed)
Bladder scan 

## 2021-09-15 NOTE — Discharge Instructions (Addendum)
Follow-up with your urologist or your primary care doctor within the next week to likely remove the catheter.  Return to the emergency room if you have any worsening symptoms.  Your kidney function was a little bit abnormal which is likely resulting from this urinary retention.  This needs to be rechecked by your primary care doctor.

## 2021-09-15 NOTE — ED Triage Notes (Signed)
Pt states lower abd pain x several days. Pt states pain radiates to back. Pt states difficulties voiding, painful urinating.  Pt also states he was constipated, but took laxatives to help.  Pt states he takes oxy 10s for chronic back pain, that has been helping with urinary pain.

## 2021-09-18 LAB — URINE CULTURE: Culture: 50000 — AB

## 2021-09-19 ENCOUNTER — Telehealth (HOSPITAL_COMMUNITY): Payer: Self-pay | Admitting: Emergency Medicine

## 2021-09-19 ENCOUNTER — Telehealth: Payer: Self-pay

## 2021-09-19 MED ORDER — AMOXICILLIN 500 MG PO CAPS: 500.0000 mg | ORAL_CAPSULE | Freq: Three times a day (TID) | ORAL | 0 refills | Status: AC

## 2021-09-19 NOTE — Telephone Encounter (Signed)
Post ED Visit - Positive Culture Follow-up: Successful Patient Follow-Up  Culture assessed and recommendations reviewed by:  [x]  , Pharm.D. []  Jani Gravel, Pharm.D., BCPS AQ-ID []  , Pharm.D., BCPS []  Celedonio Miyamoto, Pharm.D., BCPS []  Vincent, Garvin Fila.D., BCPS, AAHIVP []  , Pharm.D., BCPS, AAHIVP []  Georgina Pillion, PharmD, BCPS []  , PharmD, BCPS []  [place], PharmD, BCPS []  [place], PharmD  Positive urine culture  []  Patient discharged without antimicrobial prescription and treatment is now indicated [x]  Organism is resistant to prescribed ED discharge antimicrobial []  Patient with positive blood cultures  Changes discussed with ED provider: , MD New antibiotic prescription Amoxicillin 500 mg po TID x 7 days Called to [facility] in [place],  Contacted patient, date 09/19/2021, time 11:45 am   09/19/2021, 11:49 AM

## 2021-09-19 NOTE — Progress Notes (Signed)
ED Antimicrobial Stewardship Positive Culture Follow Up   Devon Silva is an 61 y.o. male who presented to [facility] on 09/15/2021 with a chief complaint of dysuria, suprapubic pain, increased urinary frequency. Patient was found to have urinary retention with 500 cc of urine in his bladder. A foley was placed which relieved the patient's suprapubic pain. He was discharged with the foley in place and instructions to follow-up with urology.   UA: large leukocytes, nitrite negative, RBC>50, WBC >50, non-squamous epithelial cells 0-5    Recent Results (from the past 720 hour(s))  Urine Culture     Status: Abnormal   Collection Time: 09/15/21  7:23 PM   Specimen: Urine, Clean Catch  Result Value Ref Range Status   Specimen Description   Final    URINE, CLEAN CATCH Performed at [facility], [address], [place], [place] [address]    Special Requests   Final    NONE Performed at [facility], [address], [place], [place] [address]    Culture 50,000 COLONIES/mL ENTEROCOCCUS FAECALIS (A)  Final   Report Status 09/18/2021 FINAL  Final   Organism ID, Bacteria ENTEROCOCCUS FAECALIS (A)  Final      Susceptibility   Enterococcus faecalis - MIC*    AMPICILLIN <=2 SENSITIVE Sensitive     NITROFURANTOIN <=16 SENSITIVE Sensitive     VANCOMYCIN 2 SENSITIVE Sensitive     * 50,000 COLONIES/mL ENTEROCOCCUS FAECALIS    [x]  Treated with Cephalexin, organism resistant to prescribed antimicrobial  New antibiotic prescription: STOP cephalexin. Ask patient if urinary symptoms are still present. If urinary symptoms are still present, START amoxicillin 500 mg PO TID x 7 days   ED Provider: , MD    Glyn Ade, PharmD PGY-2 Infectious Diseases Resident  09/19/2021 10:37 AM

## 2021-09-19 NOTE — Telephone Encounter (Signed)
I was informed about a positive urine culture.  Patient's urine culture grew back Enterococcus faecalis.  I called the patient to inform him about his results.  He was prescribed Keflex and has had gross symptomatic improvement.  However given the concern for resistance against cephalosporins, pharmacy recommended broadening patient to aminopenicillins.  Discussed this with the patient who is willing to change medication to Amoxil.  This was prescribed and transmitted to patient's pharmacy at this time. Patient has no other acute concerns at this time and appears to be doing well.  He has already scheduled follow-up with his PCP.

## 2021-09-20 ENCOUNTER — Other Ambulatory Visit: Payer: Self-pay

## 2021-09-20 ENCOUNTER — Emergency Department (HOSPITAL_COMMUNITY)
Admission: EM | Admit: 2021-09-20 | Discharge: 2021-09-20 | Disposition: A | Discharge status: Home / Self Care | Payer: Medicaid Other | Attending: Emergency Medicine | Admitting: Emergency Medicine

## 2021-09-20 ENCOUNTER — Encounter (HOSPITAL_COMMUNITY): Payer: Self-pay | Admitting: Emergency Medicine

## 2021-09-20 DIAGNOSIS — T839XXA Unspecified complication of genitourinary prosthetic device, implant and graft, initial encounter: Secondary | ICD-10-CM

## 2021-09-20 DIAGNOSIS — Z79899 Other long term (current) drug therapy: Secondary | ICD-10-CM | POA: Insufficient documentation

## 2021-09-20 DIAGNOSIS — Z7982 Long term (current) use of aspirin: Secondary | ICD-10-CM | POA: Insufficient documentation

## 2021-09-20 DIAGNOSIS — I110 Hypertensive heart disease with heart failure: Secondary | ICD-10-CM | POA: Insufficient documentation

## 2021-09-20 DIAGNOSIS — I509 Heart failure, unspecified: Secondary | ICD-10-CM | POA: Insufficient documentation

## 2021-09-20 NOTE — ED Provider Notes (Signed)
[facility] EMERGENCY DEPARTMENT Provider Note   CSN: [id number] Arrival date & time: 09/20/21  1007     History {Add pertinent medical, surgical, social history, OB history to HPI:1} Chief Complaint  Patient presents with  . Foley Catheter Clog    Devon Silva is a 61 y.o. male.  HPI      Devon Silva is a 61 y.o. male with past medical history of CHF, hypertension who presents to the Emergency Department complaining of complication with his Foley catheter.  Catheter was placed in this ER on 09/15/2021 secondary to acute urinary retention.  He has upcoming urology appointment on 09/26/2021.  He is here this morning stating that he has been unable to drain urine from the leg bag.  States urine is draining into the leg bag without difficulty.  No leakage.  He noticed some bloody mucus that drained into the bag yesterday and believes this is what has clogged the drainage port from the bag.  He was initially treated with Keflex, but urine culture grew back Enterococcus faecalis and he was switched to amoxicillin yesterday.  States urine today is bloody in appearance.  He denies any abdominal pain, vomiting, fever or chills.  States the Foley catheter is uncomfortable and he is having difficulty sleeping.  He is requesting to have the leg bag replaced.   Home Medications Prior to Admission medications   Medication Sig Start Date End Date Taking? Authorizing Provider  acetaminophen (TYLENOL) 325 MG tablet Take 2 tablets (650 mg total) by mouth every 6 (six) hours as needed. 03/16/21   Theron Arista, PA-C  amoxicillin (AMOXIL) 500 MG capsule Take 1 capsule (500 mg total) by mouth 3 (three) times daily for 5 days. 09/19/21 09/24/21  Glyn Ade, MD  aspirin 81 MG EC tablet Take 1 tablet (81 mg total) by mouth daily. 05/28/21   Hyman Hopes, Margaux, PA-C  atorvastatin (LIPITOR) 80 MG tablet Take 1 tablet (80 mg total) by mouth at bedtime. 05/28/21   Tanda Rockers, PA-C  carvedilol (COREG) 6.25  MG tablet Take 1.5 tablets (9.375 mg total) by mouth 2 (two) times daily with a meal. 06/07/21   Bensimhon, Bevelyn Buckles, MD  cephALEXin (KEFLEX) 500 MG capsule Take 1 capsule (500 mg total) by mouth 2 (two) times daily. 09/15/21   Rolan Bucco, MD  cyclobenzaprine (FLEXERIL) 10 MG tablet Take 1 tablet (10 mg total) by mouth 2 (two) times daily as needed for muscle spasms. 07/14/21   Carroll Sage, PA-C  dapagliflozin propanediol (FARXIGA) 10 MG TABS tablet Take 1 tablet (10 mg total) by mouth daily. 05/28/21   Hyman Hopes, Margaux, PA-C  furosemide (LASIX) 40 MG tablet Take 1 tablet (40 mg total) by mouth 2 (two) times daily for 3 days, THEN 1.5 tablets (60 mg total) daily. 07/23/21 10/24/21  Jacklynn Ganong, FNP  ibuprofen (ADVIL) 400 MG tablet Take 1 tablet (400 mg total) by mouth every 6 (six) hours as needed. 03/16/21   Theron Arista, PA-C  Oxycodone HCl 10 MG TABS Take 10 mg by mouth every 6 (six) hours as needed. 04/04/21   [provider]  potassium chloride SA (KLOR-CON M) 20 MEQ tablet Take 2 tablets (40 mEq total) by mouth daily for 3 days, THEN 1 tablet (20 mEq total) daily. 07/23/21 10/24/21  Jacklynn Ganong, FNP  sacubitril-valsartan (ENTRESTO) 97-103 MG Take 1 tablet by mouth 2 (two) times daily. 06/07/21   Bensimhon, Bevelyn Buckles, MD  spironolactone (ALDACTONE) 25 MG tablet Take 1 tablet (25  mg total) by mouth daily. 06/07/21   Bensimhon, Bevelyn Buckles, MD  tamsulosin (FLOMAX) 0.4 MG CAPS capsule Take 1 capsule (0.4 mg total) by mouth daily. 09/15/21   Rolan Bucco, MD  tiZANidine (ZANAFLEX) 2 MG tablet Take 2 mg by mouth every 6 (six) hours as needed for muscle spasms. 01/26/18   [provider]      Allergies    Patient has no known allergies.    Review of Systems   Review of Systems  Constitutional:  Negative for chills and fever.  Gastrointestinal:  Negative for abdominal pain, nausea and vomiting.  Genitourinary:  Positive for difficulty urinating. Negative for decreased urine  volume and flank pain.    Physical Exam Updated Vital Signs BP 115/86   Pulse 68   Temp 97.8 F (36.6 C) (Oral)   Resp 18   Ht 5' 10.5" (1.791 m)   Wt 102.5 kg   SpO2 95%   BMI 31.97 kg/m  Physical Exam Vitals and nursing note reviewed.  Constitutional:      General: He is not in acute distress.    Appearance: Normal appearance. He is not toxic-appearing.  Cardiovascular:     Rate and Rhythm: Normal rate and regular rhythm.     Pulses: Normal pulses.  Pulmonary:     Effort: Pulmonary effort is normal.     Breath sounds: Normal breath sounds.  Abdominal:     Palpations: Abdomen is soft.     Tenderness: There is no abdominal tenderness.  Musculoskeletal:     Right lower leg: No edema.     Left lower leg: No edema.  Skin:    General: Skin is warm.     Capillary Refill: Capillary refill takes less than 2 seconds.  Neurological:     General: No focal deficit present.     Mental Status: He is alert.     Motor: No weakness.    ED Results / Procedures / Treatments   Labs (all labs ordered are listed, but only abnormal results are displayed) Labs Reviewed - No data to display  EKG None  Radiology No results found.  Procedures Procedures  {Document cardiac monitor, telemetry assessment procedure when appropriate:1}  Medications Ordered in ED Medications - No data to display  ED Course/ Medical Decision Making/ A&P                           Medical Decision Making Patient here requesting to have his Foley catheter leg bag changed.  He was seen in the emergency department on 09/15/2021 and treated for acute urinary retention.  He was started on Keflex.  Urine culture grew Enterococcus facialis.  He was switched to amoxicillin yesterday.  On exam, urine is flowing well into the Foley leg bag.  But nursing here was unable to drain the Foley leg bag.  Leg bag was replaced without difficulty.  Gross hematuria present.  After Foley leg bag was replaced, patient ambulated  to the restroom and came back out stating that he was leaking urine all around his penis and no urine was flowing into the leg bag.  Offered to irrigate the Foley catheter, but patient declined insisting that we remove the Foley catheter completely.  I have thoroughly explained that if he has an obstructive uropathy that he may not be able to void on his own if we remove the catheter.  He verbalized understanding of this and states that he will return  to the emergency department later if he is unable to void on his own.  Amount and/or Complexity of Data Reviewed Discussion of management or test interpretation with external provider(s): Patient well-appearing, nontoxic.  Vital signs are reassuring.  Leg bag was replaced without difficulty.  Patient tolerated well, but went to the bathroom after leg bag was replaced and now stating that he is leaking urine all around his penis and at the insertion site of the Foley catheter.  He is insisting that the catheter be removed completely.  Fully discussed risk involved with this including possible ER return to have the catheter replaced if he is unable to void on his own.  He verbalized understanding of this, and Foley catheter was then removed at patient's request. He will continue amoxicillin and keep his upcoming appointment for urology.  Agrees to return to the ER if symptoms worsen.    ***  {Document critical care time when appropriate:1} {Document review of labs and clinical decision tools ie heart score, Chads2Vasc2 etc:1}  {Document your independent review of radiology images, and any outside records:1} {Document your discussion with family members, caretakers, and with consultants:1} {Document social determinants of health affecting pt's care:1} {Document your decision making why or why not admission, treatments were needed:1} Final Clinical Impression(s) / ED Diagnoses Final diagnoses:  None    Rx / DC Orders ED Discharge Orders     None

## 2021-09-20 NOTE — Discharge Instructions (Signed)
Continue your amoxicillin as directed.  Your Foley catheter has been removed today at your request.  If you are unable to void on your own, please return to the emergency department to have the catheter replaced.  Please keep your upcoming appointment with urology as scheduled.

## 2021-09-20 NOTE — ED Notes (Signed)
Changed foley leg bag. No problems noted. Patient tolerated procedure

## 2021-09-20 NOTE — ED Notes (Signed)
Patient requesting foley catheter be removed. Informed patient of risk involved if unable to urinate, explained to patient if unable to void to return to ED. Patient verbalized understanding

## 2021-09-20 NOTE — ED Triage Notes (Signed)
Pt presents with urinary foley catheter, placed at this ED on 09/15/21, per pt the bag is not draining and needs to be replaced, follow-up with Urology 09/26/21.

## 2021-09-23 ENCOUNTER — Encounter: Payer: Self-pay | Admitting: *Deleted

## 2021-09-23 NOTE — Patient Instructions (Signed)
Procedure: Colonoscopy  Estimated body mass index is 31.97 kg/m as calculated from the following:   Height as of 09/20/21: 5' 10.5" (1.791 m).   Weight as of 09/20/21: 225 lb 15.5 oz (102.5 kg).   Have you had a colonoscopy before?  no  Do you have family history of colon cancer  not sure  Do you have a family history of polyps? Not sure  Previous colonoscopy with polyps removed? N/a  Do you have a history colorectal cancer?   no  Are you diabetic?  no  Do you have a prosthetic or mechanical heart valve? no  Do you have a pacemaker/defibrillator?   no  Have you had endocarditis/atrial fibrillation?  no  Do you use supplemental oxygen/CPAP?  no  Have you had joint replacement within the last 12 months?  no  Do you tend to be constipated or have to use laxatives?  no   Do you have history of alcohol use? If yes, how much and how often.  no  Do you have history or are you using drugs? If yes, what do are you  using?  no  Have you ever had a stroke/heart attack?  no  Have you ever had a heart or other vascular stent placed,?no  Do you take weight loss medication? no   Do you take any blood-thinning medications such as: (Plavix, aspirin, Coumadin, Aggrenox, Brilinta, Xarelto, Eliquis, Pradaxa, Savaysa or Effient)   If yes we need the name, milligram, dosage and who is prescribing doctor:               Current Outpatient Medications  Medication Sig Dispense Refill   acetaminophen (TYLENOL) 325 MG tablet Take 2 tablets (650 mg total) by mouth every 6 (six) hours as needed. 30 tablet 1   amoxicillin (AMOXIL) 500 MG capsule Take 1 capsule (500 mg total) by mouth 3 (three) times daily for 5 days. 15 capsule 0   aspirin 81 MG EC tablet Take 1 tablet (81 mg total) by mouth daily. 30 tablet 0   atorvastatin (LIPITOR) 80 MG tablet Take 1 tablet (80 mg total) by mouth at bedtime. 30 tablet 0   carvedilol (COREG) 6.25 MG tablet Take 1.5 tablets (9.375 mg total) by mouth 2 (two)  times daily with a meal. 90 tablet 3   cephALEXin (KEFLEX) 500 MG capsule Take 1 capsule (500 mg total) by mouth 2 (two) times daily. 14 capsule 0   cyclobenzaprine (FLEXERIL) 10 MG tablet Take 1 tablet (10 mg total) by mouth 2 (two) times daily as needed for muscle spasms. 20 tablet 0   dapagliflozin propanediol (FARXIGA) 10 MG TABS tablet Take 1 tablet (10 mg total) by mouth daily. 30 tablet 0   furosemide (LASIX) 40 MG tablet Take 1 tablet (40 mg total) by mouth 2 (two) times daily for 3 days, THEN 1.5 tablets (60 mg total) daily. 90 tablet 3   ibuprofen (ADVIL) 400 MG tablet Take 1 tablet (400 mg total) by mouth every 6 (six) hours as needed. 30 tablet 0   Oxycodone HCl 10 MG TABS Take 10 mg by mouth every 6 (six) hours as needed.     potassium chloride SA (KLOR-CON M) 20 MEQ tablet Take 2 tablets (40 mEq total) by mouth daily for 3 days, THEN 1 tablet (20 mEq total) daily. 30 tablet 3   sacubitril-valsartan (ENTRESTO) 97-103 MG Take 1 tablet by mouth 2 (two) times daily. 60 tablet 3   spironolactone (ALDACTONE) 25 MG tablet  Take 1 tablet (25 mg total) by mouth daily. 30 tablet 3   tamsulosin (FLOMAX) 0.4 MG CAPS capsule Take 1 capsule (0.4 mg total) by mouth daily. 10 capsule 0   tiZANidine (ZANAFLEX) 2 MG tablet Take 2 mg by mouth every 6 (six) hours as needed for muscle spasms.     No current facility-administered medications for this visit.    No Known Allergies

## 2021-09-26 ENCOUNTER — Encounter: Payer: Self-pay | Admitting: *Deleted

## 2021-09-26 NOTE — Progress Notes (Signed)
History of heart disease. Needs an OV.

## 2021-09-26 NOTE — Progress Notes (Signed)
OV sch'd 10/16/21, apt letter mailed to patient 

## 2021-10-05 ENCOUNTER — Other Ambulatory Visit (HOSPITAL_COMMUNITY): Payer: Self-pay | Admitting: Internal Medicine

## 2021-10-16 ENCOUNTER — Ambulatory Visit: Payer: Medicaid Other | Admitting: Gastroenterology
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
## 2021-10-22 ENCOUNTER — Ambulatory Visit (INDEPENDENT_AMBULATORY_CARE_PROVIDER_SITE_OTHER): Payer: Medicaid Other | Admitting: Gastroenterology

## 2021-10-22 ENCOUNTER — Encounter: Payer: Self-pay | Admitting: Gastroenterology

## 2021-10-22 VITALS — BP 92/64 | HR 70 | Temp 98.0°F | Ht 70.0 in | Wt 223.8 lb

## 2021-10-22 DIAGNOSIS — K5903 Drug induced constipation: Secondary | ICD-10-CM | POA: Diagnosis not present

## 2021-10-22 DIAGNOSIS — Z1211 Encounter for screening for malignant neoplasm of colon: Secondary | ICD-10-CM

## 2021-10-22 NOTE — Patient Instructions (Signed)
Are scheduling for colonoscopy in the near future with Dr. Marletta Lor.  You will receive separate written instructions in the mail.  This will completely outline when to switch to clear liquid diet as and to begin drinking the prep.  The prep will be sent to your pharmacy.  We will reach out to cardiology to request clearance to make sure they feel as though you are stable enough to undergo the procedure.  For your constipation, I suspect this is likely due to chronic use of oxycodone.  This is fairly common with these types of medications.  I want you been taking MiraLAX 17 g (1 capful) daily to help with straining with having a bowel movement.  If you begin to have looser stools, or more frequent looser stools you may back off to 1 capful every other day.  You can also increase the amount of fiber in your diet by eating fiber rich foods.  It was a pleasure to see you today. I want to create trusting relationships with patients. If you receive a survey regarding your visit,  I greatly appreciate you taking time to fill this out on paper or through your MyChart. I value your feedback.  Brooke Bonito, MSN, FNP-BC, AGACNP-BC [facility]

## 2021-10-23 ENCOUNTER — Encounter: Payer: Self-pay | Admitting: *Deleted

## 2021-10-23 ENCOUNTER — Telehealth: Payer: Self-pay | Admitting: *Deleted

## 2021-10-23 MED ORDER — PEG 3350-KCL-NA BICARB-NACL 420 G PO SOLR: 4000.0000 mL | Freq: Once | ORAL | 0 refills | Status: AC

## 2021-10-23 NOTE — Telephone Encounter (Signed)
  What type of surgery is being performed? Screening colonoscopy  When is surgery scheduled? TBD  What type of clearance is required (medical or pharmacy to hold medication or both? Medical Clearance  Name of physician performing surgery?  Dr. Hurshel Keys [facility] Phone: [phone] Fax: [phone]  Anethesia type (none, local, MAC, general)? MAC

## 2021-10-23 NOTE — Telephone Encounter (Signed)
PA approved via [facility] website. Auth# [id number], DOS: Nov 22, 2021 - Feb 20, 2022

## 2021-10-23 NOTE — Telephone Encounter (Signed)
CALLED PT. Scheduled for 10/13 with Dr. Marletta Lor, aware will mail prep instructions/pre-op appt. Rx for prep sent to pharmacy

## 2021-11-06 ENCOUNTER — Encounter (HOSPITAL_COMMUNITY): Payer: Medicaid Other | Admitting: Internal Medicine

## 2021-11-18 NOTE — Patient Instructions (Signed)
Ashe Gago  11/18/2021     @PREFPERIOPPHARMACY @   Your procedure is scheduled on  11/22/2021.   Report to [facility] at  [phone] A.M.   Call this number if you have problems the morning of surgery:  [phone]   Remember:  Follow the diet and prep instructions given to you by the office.    Take these medicines the morning of surgery with A SIP OF WATER          carvedilol, oxycodone(If needed), entresto, flomax.     Do not wear jewelry, make-up or nail polish.  Do not wear lotions, powders, or perfumes, or deodorant.  Do not shave 48 hours prior to surgery.  Men may shave face and neck.  Do not bring valuables to the hospital.  [facility] is not responsible for any belongings or valuables.  Contacts, dentures or bridgework may not be worn into surgery.  Leave your suitcase in the car.  After surgery it may be brought to your room.  For patients admitted to the hospital, discharge time will be determined by your treatment team.  Patients discharged the day of surgery will not be allowed to drive home and must have someone with them for 24 hours.    Special instructions:   DO NOT smoke tobacco or vape for 24 hours before your procedure.  Please read over the following fact sheets that you were given. Anesthesia Post-op Instructions and Care and Recovery After Surgery      Colonoscopy, Adult, Care After The following information offers guidance on how to care for yourself after your procedure. Your health care provider may also give you more specific instructions. If you have problems or questions, contact your health care provider. What can I expect after the procedure? After the procedure, it is common to have: A small amount of blood in your stool for 24 hours after the procedure. Some gas. Mild cramping or bloating of your abdomen. Follow these instructions at home: Eating and drinking  Drink enough fluid to keep your urine pale yellow. Follow  instructions from your health care provider about eating or drinking restrictions. Resume your normal diet as told by your health care provider. Avoid heavy or fried foods that are hard to digest. Activity Rest as told by your health care provider. Avoid sitting for a long time without moving. Get up to take short walks every 1-2 hours. This is important to improve blood flow and breathing. Ask for help if you feel weak or unsteady. Return to your normal activities as told by your health care provider. Ask your health care provider what activities are safe for you. Managing cramping and bloating  Try walking around when you have cramps or feel bloated. If directed, apply heat to your abdomen as told by your health care provider. Use the heat source that your health care provider recommends, such as a moist heat pack or a heating pad. Place a towel between your skin and the heat source. Leave the heat on for 20-30 minutes. Remove the heat if your skin turns bright red. This is especially important if you are unable to feel pain, heat, or cold. You have a greater risk of getting burned. General instructions If you were given a sedative during the procedure, it can affect you for several hours. Do not drive or operate machinery until your health care provider says that it is safe. For the first 24 hours after the procedure: Do  not sign important documents. Do not drink alcohol. Do your regular daily activities at a slower pace than normal. Eat soft foods that are easy to digest. Take over-the-counter and prescription medicines only as told by your health care provider. Keep all follow-up visits. This is important. Contact a health care provider if: You have blood in your stool 2-3 days after the procedure. Get help right away if: You have more than a small spotting of blood in your stool. You have large blood clots in your stool. You have swelling of your abdomen. You have nausea or  vomiting. You have a fever. You have increasing pain in your abdomen that is not relieved with medicine. These symptoms may be an emergency. Get help right away. Call 911. Do not wait to see if the symptoms will go away. Do not drive yourself to the hospital. Summary After the procedure, it is common to have a small amount of blood in your stool. You may also have mild cramping and bloating of your abdomen. If you were given a sedative during the procedure, it can affect you for several hours. Do not drive or operate machinery until your health care provider says that it is safe. Get help right away if you have a lot of blood in your stool, nausea or vomiting, a fever, or increased pain in your abdomen. This information is not intended to replace advice given to you by your health care provider. Make sure you discuss any questions you have with your health care provider. Document Revised: 09/19/2020 Document Reviewed: 09/19/2020 Elsevier Patient Education  [place] After This sheet gives you information about how to care for yourself after your procedure. Your health care provider may also give you more specific instructions. If you have problems or questions, contact your health care provider. What can I expect after the procedure? After the procedure, it is common to have: Tiredness. Forgetfulness about what happened after the procedure. Impaired judgment for important decisions. Nausea or vomiting. Some difficulty with balance. Follow these instructions at home: For the time period you were told by your health care provider:     Rest as needed. Do not participate in activities where you could fall or become injured. Do not drive or use machinery. Do not drink alcohol. Do not take sleeping pills or medicines that cause drowsiness. Do not make important decisions or sign legal documents. Do not take care of children on your own. Eating and  drinking Follow the diet that is recommended by your health care provider. Drink enough fluid to keep your urine pale yellow. If you vomit: Drink water, juice, or soup when you can drink without vomiting. Make sure you have little or no nausea before eating solid foods. General instructions Have a responsible adult stay with you for the time you are told. It is important to have someone help care for you until you are awake and alert. Take over-the-counter and prescription medicines only as told by your health care provider. If you have sleep apnea, surgery and certain medicines can increase your risk for breathing problems. Follow instructions from your health care provider about wearing your sleep device: Anytime you are sleeping, including during daytime naps. While taking prescription pain medicines, sleeping medicines, or medicines that make you drowsy. Avoid smoking. Keep all follow-up visits as told by your health care provider. This is important. Contact a health care provider if: You keep feeling nauseous or you keep vomiting. You feel  light-headed. You are still sleepy or having trouble with balance after 24 hours. You develop a rash. You have a fever. You have redness or swelling around the IV site. Get help right away if: You have trouble breathing. You have new-onset confusion at home. Summary For several hours after your procedure, you may feel tired. You may also be forgetful and have poor judgment. Have a responsible adult stay with you for the time you are told. It is important to have someone help care for you until you are awake and alert. Rest as told. Do not drive or operate machinery. Do not drink alcohol or take sleeping pills. Get help right away if you have trouble breathing, or if you suddenly become confused. This information is not intended to replace advice given to you by your health care provider. Make sure you discuss any questions you have with your  health care provider. Document Revised: 01/01/2021 Document Reviewed: 12/30/2018 Elsevier Patient Education  [place].

## 2021-11-19 ENCOUNTER — Encounter (HOSPITAL_COMMUNITY)
Admission: RE | Admit: 2021-11-19 | Discharge: 2021-11-19 | Disposition: A | Discharge status: Home / Self Care | Payer: Medicaid Other | Source: Ambulatory Visit | Attending: Internal Medicine | Admitting: Internal Medicine

## 2021-11-19 ENCOUNTER — Other Ambulatory Visit (HOSPITAL_COMMUNITY): Payer: Self-pay | Admitting: Internal Medicine

## 2021-11-19 ENCOUNTER — Encounter (HOSPITAL_COMMUNITY): Payer: Self-pay

## 2021-11-19 ENCOUNTER — Encounter (HOSPITAL_COMMUNITY): Admission: RE | Admit: 2021-11-19 | Payer: Medicaid Other | Source: Ambulatory Visit

## 2021-11-19 DIAGNOSIS — F1491 Cocaine use, unspecified, in remission: Secondary | ICD-10-CM | POA: Diagnosis not present

## 2021-11-19 DIAGNOSIS — F1910 Other psychoactive substance abuse, uncomplicated: Secondary | ICD-10-CM | POA: Insufficient documentation

## 2021-11-19 DIAGNOSIS — Z01812 Encounter for preprocedural laboratory examination: Secondary | ICD-10-CM | POA: Diagnosis present

## 2021-11-19 DIAGNOSIS — R7303 Prediabetes: Secondary | ICD-10-CM

## 2021-11-19 LAB — BASIC METABOLIC PANEL
Anion gap: 9 (ref 5–15)
BUN: 17 mg/dL (ref 6–20)
CO2: 22 mmol/L (ref 22–32)
Calcium: 9.6 mg/dL (ref 8.9–10.3)
Chloride: 107 mmol/L (ref 98–111)
Creatinine, Ser: 1.12 mg/dL (ref 0.61–1.24)
GFR, Estimated: >60 mL/min (ref >60)
Glucose, Bld: 95 mg/dL (ref 70–99)
Potassium: 3.9 mmol/L (ref 3.5–5.1)
Sodium: 138 mmol/L (ref 135–145)

## 2021-11-19 LAB — RAPID URINE DRUG SCREEN, HOSP PERFORMED
Amphetamines: NOT DETECTED
Barbiturates: NOT DETECTED
Benzodiazepines: NOT DETECTED
Cocaine: NOT DETECTED
Opiates: NOT DETECTED
Tetrahydrocannabinol: NOT DETECTED

## 2021-11-20 ENCOUNTER — Other Ambulatory Visit: Payer: Self-pay | Admitting: Urology

## 2021-11-20 ENCOUNTER — Telehealth (HOSPITAL_COMMUNITY): Payer: Self-pay | Admitting: Vascular Surgery

## 2021-11-20 NOTE — Telephone Encounter (Signed)
Butch Penny from [facility]  please call [phone] ext [phone] about surgical clearance

## 2021-11-21 NOTE — Telephone Encounter (Signed)
Form received and placed on providers desk to review.

## 2021-11-22 ENCOUNTER — Encounter (HOSPITAL_COMMUNITY): Payer: Self-pay

## 2021-11-22 ENCOUNTER — Other Ambulatory Visit: Payer: Self-pay

## 2021-11-22 ENCOUNTER — Encounter (HOSPITAL_COMMUNITY)
Admission: RE | Disposition: A | Discharge status: Home / Self Care | Payer: Self-pay | Source: Home / Self Care | Attending: Internal Medicine

## 2021-11-22 ENCOUNTER — Ambulatory Visit (HOSPITAL_COMMUNITY): Payer: Medicaid Other | Admitting: Anesthesiology

## 2021-11-22 ENCOUNTER — Ambulatory Visit (HOSPITAL_COMMUNITY)
Admission: RE | Admit: 2021-11-22 | Discharge: 2021-11-22 | Disposition: A | Discharge status: Home / Self Care | Payer: Medicaid Other | Attending: Internal Medicine | Admitting: Internal Medicine

## 2021-11-22 ENCOUNTER — Ambulatory Visit (HOSPITAL_BASED_OUTPATIENT_CLINIC_OR_DEPARTMENT_OTHER): Payer: Medicaid Other | Admitting: Anesthesiology

## 2021-11-22 DIAGNOSIS — K648 Other hemorrhoids: Secondary | ICD-10-CM | POA: Diagnosis not present

## 2021-11-22 DIAGNOSIS — K5730 Diverticulosis of large intestine without perforation or abscess without bleeding: Secondary | ICD-10-CM | POA: Insufficient documentation

## 2021-11-22 DIAGNOSIS — Z79899 Other long term (current) drug therapy: Secondary | ICD-10-CM | POA: Diagnosis not present

## 2021-11-22 DIAGNOSIS — Z1211 Encounter for screening for malignant neoplasm of colon: Secondary | ICD-10-CM

## 2021-11-22 DIAGNOSIS — K635 Polyp of colon: Secondary | ICD-10-CM | POA: Diagnosis not present

## 2021-11-22 DIAGNOSIS — Z87891 Personal history of nicotine dependence: Secondary | ICD-10-CM | POA: Insufficient documentation

## 2021-11-22 DIAGNOSIS — I110 Hypertensive heart disease with heart failure: Secondary | ICD-10-CM | POA: Diagnosis not present

## 2021-11-22 DIAGNOSIS — I509 Heart failure, unspecified: Secondary | ICD-10-CM | POA: Insufficient documentation

## 2021-11-22 DIAGNOSIS — Z1212 Encounter for screening for malignant neoplasm of rectum: Secondary | ICD-10-CM | POA: Diagnosis not present

## 2021-11-22 HISTORY — PX: POLYPECTOMY: SHX5525

## 2021-11-22 HISTORY — PX: COLONOSCOPY WITH PROPOFOL: SHX5780

## 2021-11-22 SURGERY — COLONOSCOPY WITH PROPOFOL
Anesthesia: General

## 2021-11-22 MED ORDER — PROPOFOL 10 MG/ML IV BOLUS
INTRAVENOUS | Status: DC | PRN
  Administered 2021-11-22: 50 mg via INTRAVENOUS
  Administered 2021-11-22 (×2): 20 mg via INTRAVENOUS

## 2021-11-22 MED ORDER — PROPOFOL 500 MG/50ML IV EMUL
INTRAVENOUS | Status: DC | PRN
  Administered 2021-11-22: 150 ug/kg/min via INTRAVENOUS

## 2021-11-22 MED ORDER — LIDOCAINE HCL (CARDIAC) PF 100 MG/5ML IV SOSY
PREFILLED_SYRINGE | INTRAVENOUS | Status: DC | PRN
  Administered 2021-11-22: 80 mg via INTRAVENOUS

## 2021-11-22 MED ORDER — LACTATED RINGERS IV SOLN: INTRAVENOUS | Status: DC | PRN

## 2021-11-22 NOTE — Anesthesia Postprocedure Evaluation (Signed)
Anesthesia Post Note  Patient: Devon Silva  Procedure(s) Performed: COLONOSCOPY WITH PROPOFOL POLYPECTOMY  Patient location during evaluation: Phase II Anesthesia Type: General Level of consciousness: awake and alert and oriented Pain management: pain level controlled Vital Signs Assessment: post-procedure vital signs reviewed and stable Respiratory status: spontaneous breathing, nonlabored ventilation and respiratory function stable Cardiovascular status: blood pressure returned to baseline and stable Postop Assessment: no apparent nausea or vomiting Anesthetic complications: no   No notable events documented.   Last Vitals:  Vitals:   11/22/21 0915  BP: (!) 108/58  Pulse: 62  Resp: 13  Temp: (!) 36.4 C  SpO2: 99%    Last Pain:  Vitals:   11/22/21 0915  PainSc: 0-No pain                 Evelisse Szalkowski C Casimira Sutphin

## 2021-11-22 NOTE — Op Note (Signed)
[facility] Patient Name: Devon Silva Procedure Date: 11/22/2021 8:41 AM MRN: [id number] Date of Birth: 06/16/1960 Attending MD: Elon Alas. Abbey Chatters DO CSN: [id number] Age: 61 Admit Type: Outpatient Procedure:                Colonoscopy Indications:              Screening for colorectal malignant neoplasm Providers:                Elon Alas. Abbey Chatters, DO, Jessica Boudreaux, Casimer Bilis, Technician Referring MD:              Medicines:                See the Anesthesia note for documentation of the                            administered medications Complications:            No immediate complications. Estimated Blood Loss:     Estimated blood loss was minimal. Procedure:                Pre-Anesthesia Assessment:                           - The anesthesia plan was to use monitored                            anesthesia care (MAC).                           After obtaining informed consent, the colonoscope                            was passed under direct vision. Throughout the                            procedure, the patient's blood pressure, pulse, and                            oxygen saturations were monitored continuously. The                            [id number]-HQ190L ([id number]) scope was introduced through                            the anus and advanced to the the cecum, identified                            by appendiceal orifice and ileocecal valve. The                            colonoscopy was performed without difficulty. The                            patient tolerated the procedure well. The  quality                            of the bowel preparation was evaluated using the                            BBPS [facility] Bowel Preparation Scale) with scores                            of: Right Colon = 3, Transverse Colon = 3 and Left                            Colon = 3 (entire mucosa seen well with no residual                             staining, small fragments of stool or opaque                            liquid). The total BBPS score equals 9. Scope In: 8:56:41 AM Scope Out: 9:09:46 AM Scope Withdrawal Time: 0 hours 10 minutes 42 seconds  Total Procedure Duration: 0 hours 13 minutes 5 seconds  Findings:      The perianal and digital rectal examinations were normal.      Non-bleeding internal hemorrhoids were found during endoscopy.      Multiple small and large-mouthed diverticula were found in the sigmoid       colon and descending colon.      A 4 mm polyp was found in the descending colon. The polyp was sessile.       The polyp was removed with a cold snare. Resection and retrieval were       complete.      The exam was otherwise without abnormality. Impression:               - Non-bleeding internal hemorrhoids.                           - Diverticulosis in the sigmoid colon and in the                            descending colon.                           - One 4 mm polyp in the descending colon, removed                            with a cold snare. Resected and retrieved.                           - The examination was otherwise normal. Moderate Sedation:      Per Anesthesia Care Recommendation:           - Patient has a contact number available for                            emergencies. The signs and symptoms of potential  delayed complications were discussed with the                            patient. Return to normal activities tomorrow.                            Written discharge instructions were provided to the                            patient.                           - Resume previous diet.                           - Continue present medications.                           - Await pathology results.                           - Repeat colonoscopy in 5-10 years for surveillance.                           - Return to GI clinic PRN. Procedure Code(s):        ---  Professional ---                           [id number], Colonoscopy, flexible; with removal of                            tumor(s), polyp(s), or other lesion(s) by snare                            technique Diagnosis Code(s):        --- Professional ---                           Z12.11, Encounter for screening for malignant                            neoplasm of colon                           K64.8, Other hemorrhoids                           K63.5, Polyp of colon                           K57.30, Diverticulosis of large intestine without                            perforation or abscess without bleeding CPT copyright 2019 American Medical Association. All rights reserved. The codes documented in this report are preliminary and upon coder review may  be revised to meet current compliance requirements. Elon Alas. Abbey Chatters, DO [place], DO 11/22/2021  9:12:32 AM This report has been signed electronically. Number of Addenda: 0

## 2021-11-22 NOTE — H&P (Signed)
Primary Care Physician:  Carrolyn Meiers, MD Primary Gastroenterologist:  Dr. Abbey Chatters  Pre-Procedure History & Physical: HPI:  Devon Silva is a 61 y.o. male is here for first ever colonoscopy for colon cancer screening purposes.  Patient denies any family history of colorectal cancer.  No melena or hematochezia.  No abdominal pain or unintentional weight loss.  No change in bowel habits.  Overall feels well from a GI standpoint.  Past Medical History:  Diagnosis Date   CHF (congestive heart failure) (HCC)    Chronic back pain    Heart valve problem    Hypertension     Past Surgical History:  Procedure Laterality Date   RIGHT/LEFT HEART CATH AND CORONARY ANGIOGRAPHY N/A 04/18/2020   Procedure: RIGHT/LEFT HEART CATH AND CORONARY ANGIOGRAPHY;  Surgeon: Jolaine Artist, MD;  Location: [facility];  Service: Cardiovascular;  Laterality: N/A;   TEE WITHOUT CARDIOVERSION N/A 04/18/2020   Procedure: TRANSESOPHAGEAL ECHOCARDIOGRAM (TEE);  Surgeon: Jolaine Artist, MD;  Location: [facility]COPY;  Service: Cardiovascular;  Laterality: N/A;    Prior to Admission medications   Medication Sig Start Date End Date Taking? Authorizing Provider  acetaminophen (TYLENOL) 325 MG tablet Take 2 tablets (650 mg total) by mouth every 6 (six) hours as needed. 03/16/21  Yes Sherrill Raring, PA-C  aspirin EC 81 MG tablet Take 81 mg by mouth daily. Swallow whole.   Yes [provider]  carvedilol (COREG) 6.25 MG tablet TAKE 1.5 TABLETS BY MOUTH TWICE DAILY WITH MEALS. 11/19/21  Yes Bensimhon, Shaune Pascal, MD  ENTRESTO 97-103 MG TAKE (1) TABLET BY MOUTH TWICE DAILY. 11/19/21  Yes Bensimhon, Shaune Pascal, MD  furosemide (LASIX) 40 MG tablet Take 1 tablet (40 mg total) by mouth 2 (two) times daily for 3 days, THEN 1.5 tablets (60 mg total) daily. Patient taking differently: Take 60 mg by mouth once daily  07/23/21 11/12/21 Yes Mil[place], Maricela Bo, FNP  Oxycodone HCl 10 MG TABS Take 10 mg by mouth 4  (four) times daily. 04/04/21  Yes [provider]  spironolactone (ALDACTONE) 25 MG tablet TAKE ONE TABLET BY MOUTH ONCE DAILY. 10/07/21  Yes Bensimhon, Shaune Pascal, MD  tamsulosin (FLOMAX) 0.4 MG CAPS capsule Take 1 capsule (0.4 mg total) by mouth daily. 09/15/21  Yes Malvin Johns, MD  atorvastatin (LIPITOR) 80 MG tablet Take 1 tablet (80 mg total) by mouth at bedtime. 05/28/21   Alroy Bailiff, Margaux, PA-C  potassium chloride SA (KLOR-CON M) 20 MEQ tablet Take 2 tablets (40 mEq total) by mouth daily for 3 days, THEN 1 tablet (20 mEq total) daily. Patient taking differently: Take 20 meq by mouth daily 07/23/21 11/12/21  Rafael Bihari, FNP    Allergies as of 10/23/2021   (No Known Allergies)    Family History  Problem Relation Age of Onset   Cancer Father    Heart disease Father    Prostate cancer Brother    Cancer Brother    Cancer - Colon Neg Hx    Colon polyps Neg Hx     Social History   Socioeconomic History   Marital status: Married    Spouse name: Not on file   Number of children: Not on file   Years of education: Not on file   Highest education level: Not on file  Occupational History   Not on file  Tobacco Use   Smoking status: Former    Packs/day: 0.25    Years: 20.00    Total pack years: 5.00  Types: Cigarettes    Quit date: 10/12/2021    Years since quitting: 0.1   Smokeless tobacco: Never   Tobacco comments:    1 cig/ daily  Vaping Use   Vaping Use: Never used  Substance and Sexual Activity   Alcohol use: Not Currently    Alcohol/week: 2.0 standard drinks of alcohol    Types: 1 Cans of beer, 1 Shots of liquor per week    Comment: stopped - over a month   Drug use: Not Currently   Sexual activity: Not on file  Other Topics Concern   Not on file  Social History Narrative   Not on file   Social Determinants of Health   Financial Resource Strain: High Risk (04/06/2020)   Overall Financial Resource Strain (CARDIA)    Difficulty of Paying Living  Expenses: Very hard  Food Insecurity: No Food Insecurity (04/06/2020)   Hunger Vital Sign    Worried About Running Out of Food in the Last Year: Never true    Ran Out of Food in the Last Year: Never true  Transportation Needs: Unmet Transportation Needs (04/06/2020)   PRAPARE - Hydrologist (Medical): Yes    Lack of Transportation (Non-Medical): No  Physical Activity: Not on file  Stress: Not on file  Social Connections: Not on file  Intimate Partner Violence: Not on file    Review of Systems: See HPI, otherwise negative ROS  Physical Exam: Vital signs in last 24 hours: Weight:  [101 kg] 101 kg (10/13 0753)   General:   Alert,  Well-developed, well-nourished, pleasant and cooperative in NAD Head:  Normocephalic and atraumatic. Eyes:  Sclera clear, no icterus.   Conjunctiva pink. Ears:  Normal auditory acuity. Nose:  No deformity, discharge,  or lesions. Mouth:  No deformity or lesions, dentition normal. Neck:  Supple; no masses or thyromegaly. Lungs:  Clear throughout to auscultation.   No wheezes, crackles, or rhonchi. No acute distress. Heart:  Regular rate and rhythm; no murmurs, clicks, rubs,  or gallops. Abdomen:  Soft, nontender and nondistended. No masses, hepatosplenomegaly or hernias noted. Normal bowel sounds, without guarding, and without rebound.   Msk:  Symmetrical without gross deformities. Normal posture. Extremities:  Without clubbing or edema. Neurologic:  Alert and  oriented x4;  grossly normal neurologically. Skin:  Intact without significant lesions or rashes. Cervical Nodes:  No significant cervical adenopathy. Psych:  Alert and cooperative. Normal mood and affect.  Impression/Plan: Devon Silva is here for a colonoscopy to be performed for colon cancer screening purposes.  The risks of the procedure including infection, bleed, or perforation as well as benefits, limitations, alternatives and imponderables have been reviewed  with the patient. Questions have been answered. All parties agreeable.

## 2021-11-22 NOTE — Transfer of Care (Signed)
Immediate Anesthesia Transfer of Care Note  Patient: Devon Silva  Procedure(s) Performed: COLONOSCOPY WITH PROPOFOL POLYPECTOMY  Patient Location: Short Stay  Anesthesia Type:General  Level of Consciousness: awake  Airway & Oxygen Therapy: Patient Spontanous Breathing  Post-op Assessment: Report given to RN and Post -op Vital signs reviewed and stable  Post vital signs: Reviewed and stable  Last Vitals:  Vitals Value Taken Time  BP 108/58 11/22/21 0915  Temp 36.4 C 11/22/21 0915  Pulse 62 11/22/21 0915  Resp 13 11/22/21 0915  SpO2 99 % 11/22/21 0915    Last Pain:  Vitals:   11/22/21 0915  PainSc: 0-No pain      Patients Stated Pain Goal: 7 (37/36/68 1594)  Complications: No notable events documented.

## 2021-11-22 NOTE — Discharge Instructions (Signed)
  Colonoscopy Discharge Instructions  Read the instructions outlined below and refer to this sheet in the next few weeks. These discharge instructions provide you with general information on caring for yourself after you leave the hospital. Your doctor may also give you specific instructions. While your treatment has been planned according to the most current medical practices available, unavoidable complications occasionally occur.   ACTIVITY You may resume your regular activity, but move at a slower pace for the next 24 hours.  Take frequent rest periods for the next 24 hours.  Walking will help get rid of the air and reduce the bloated feeling in your belly (abdomen).  No driving for 24 hours (because of the medicine (anesthesia) used during the test).   Do not sign any important legal documents or operate any machinery for 24 hours (because of the anesthesia used during the test).  NUTRITION Drink plenty of fluids.  You may resume your normal diet as instructed by your doctor.  Begin with a light meal and progress to your normal diet. Heavy or fried foods are harder to digest and may make you feel sick to your stomach (nauseated).  Avoid alcoholic beverages for 24 hours or as instructed.  MEDICATIONS You may resume your normal medications unless your doctor tells you otherwise.  WHAT YOU CAN EXPECT TODAY Some feelings of bloating in the abdomen.  Passage of more gas than usual.  Spotting of blood in your stool or on the toilet paper.  IF YOU HAD POLYPS REMOVED DURING THE COLONOSCOPY: No aspirin products for 7 days or as instructed.  No alcohol for 7 days or as instructed.  Eat a soft diet for the next 24 hours.  FINDING OUT THE RESULTS OF YOUR TEST Not all test results are available during your visit. If your test results are not back during the visit, make an appointment with your caregiver to find out the results. Do not assume everything is normal if you have not heard from your  caregiver or the medical facility. It is important for you to follow up on all of your test results.  SEEK IMMEDIATE MEDICAL ATTENTION IF: You have more than a spotting of blood in your stool.  Your belly is swollen (abdominal distention).  You are nauseated or vomiting.  You have a temperature over 101.  You have abdominal pain or discomfort that is severe or gets worse throughout the day.   Your colonoscopy revealed 1 polyp(s) which I removed successfully. Await pathology results, my office will contact you. I recommend repeating colonoscopy in 5-10 years for surveillance purposes.   You also have diverticulosis and internal hemorrhoids. I would recommend increasing fiber in your diet or adding OTC Benefiber/Metamucil. Be sure to drink at least 4 to 6 glasses of water daily. Follow-up with GI in 3 months   I hope you have a great rest of your week!  Elon Alas. Abbey Chatters, D.O. Gastroenterology and Hepatology [facility]

## 2021-11-22 NOTE — Anesthesia Preprocedure Evaluation (Signed)
Anesthesia Evaluation  Patient identified by MRN, date of birth, ID band Patient awake    Reviewed: Allergy & Precautions, NPO status , Patient's Chart, lab work & pertinent test results, reviewed documented beta blocker date and time   Airway Mallampati: II  TM Distance: >3 FB Neck ROM: Full    Dental  (+) Dental Advisory Given, Missing, Chipped   Pulmonary neg pulmonary ROS, former smoker,    Pulmonary exam normal breath sounds clear to auscultation       Cardiovascular Exercise Tolerance: Good hypertension, Pt. on home beta blockers and Pt. on medications +CHF  + Valvular Problems/Murmurs  Rhythm:Regular Rate:Normal  1. Compared to TEE dopne 3/9//22 EF improved, decreased atrial sizes and  marked improvement in degree of MR/TR.  2. Left ventricular ejection fraction, by estimation, is 35 to 40%. The left ventricle has moderately decreased function. The left ventricle  demonstrates global hypokinesis. The left ventricular internal cavity size was moderately to severely dilated.  Left ventricular diastolic parameters were normal.  3. Right ventricular systolic function is normal. The right ventricular size is normal. There is normal pulmonary artery systolic pressure.  4. Left atrial size was mildly dilated.  5. The mitral valve is abnormal. Mild mitral valve regurgitation. No evidence of mitral stenosis.  6. The aortic valve is tricuspid. There is mild calcification of the aortic valve. Aortic valve regurgitation is not visualized. Mild aortic valve sclerosis is present, with no evidence of aortic valve stenosis.  7. The inferior vena cava is normal in size with greater than 50% respiratory variability, suggesting right atrial pressure of 3 mmHg.    Neuro/Psych negative neurological ROS  negative psych ROS   GI/Hepatic negative GI ROS, (+)     substance abuse  cocaine use,   Endo/Other  negative endocrine ROS   Renal/GU negative Renal ROS  negative genitourinary   Musculoskeletal negative musculoskeletal ROS (+)   Abdominal   Peds negative pediatric ROS (+)  Hematology negative hematology ROS (+)   Anesthesia Other Findings   Reproductive/Obstetrics negative OB ROS                             Anesthesia Physical Anesthesia Plan  ASA: 3  Anesthesia Plan: General   Post-op Pain Management: Minimal or no pain anticipated   Induction: Intravenous  PONV Risk Score and Plan: Propofol infusion  Airway Management Planned: Nasal Cannula and Natural Airway  Additional Equipment:   Intra-op Plan:   Post-operative Plan:   Informed Consent: I have reviewed the patients History and Physical, chart, labs and discussed the procedure including the risks, benefits and alternatives for the proposed anesthesia with the patient or authorized representative who has indicated his/her understanding and acceptance.     Dental advisory given  Plan Discussed with: CRNA and Surgeon  Anesthesia Plan Comments:         Anesthesia Quick Evaluation

## 2021-11-25 LAB — SURGICAL PATHOLOGY

## 2021-11-27 ENCOUNTER — Encounter (HOSPITAL_COMMUNITY): Payer: Self-pay | Admitting: Internal Medicine

## 2021-11-29 NOTE — Telephone Encounter (Signed)
Per Dr Haroldine Laws: Moderate risk from cardiac standpoint--ok to proceed  Form faxed back to [facility] at [phone]

## 2021-12-26 ENCOUNTER — Other Ambulatory Visit (HOSPITAL_COMMUNITY): Payer: Self-pay | Admitting: Internal Medicine

## 2022-01-08 ENCOUNTER — Encounter (HOSPITAL_COMMUNITY)
Admission: RE | Admit: 2022-01-08 | Discharge: 2022-01-08 | Disposition: A | Discharge status: Home / Self Care | Payer: Medicaid Other | Source: Ambulatory Visit | Attending: Urology | Admitting: Urology

## 2022-01-08 ENCOUNTER — Other Ambulatory Visit (HOSPITAL_COMMUNITY): Payer: Self-pay

## 2022-01-08 ENCOUNTER — Encounter (HOSPITAL_COMMUNITY): Payer: Self-pay | Admitting: Urology

## 2022-01-08 NOTE — Patient Instructions (Signed)
SURGICAL WAITING ROOM VISITATION Patients having surgery or a procedure may have no more than 2 support people in the waiting area - these visitors may rotate.   Children under the age of 51 must have an adult with them who is not the patient. If the patient needs to stay at the hospital during part of their recovery, the visitor guidelines for inpatient rooms apply. Pre-op nurse will coordinate an appropriate time for 1 support person to accompany patient in pre-op.  This support person may not rotate.    Please refer to the [facility] website for the visitor guidelines for Inpatients (after your surgery is over and you are in a regular room).     Your procedure is scheduled on: 01/17/22   Report to [facility] Main Entrance    Report to admitting at 5:15 AM   Call this number if you have problems the morning of surgery [phone]   Do not eat food  or liquids :After Midnight.          If you have questions, please contact your surgeon's office.   FOLLOW BOWEL PREP AND ANY ADDITIONAL PRE OP INSTRUCTIONS YOU RECEIVED FROM YOUR SURGEON'S OFFICE!!!     Oral Hygiene is also important to reduce your risk of infection.                                    Remember - BRUSH YOUR TEETH THE MORNING OF SURGERY WITH YOUR REGULAR TOOTHPASTE   Do NOT smoke after Midnight   Take these medicines the morning of surgery with A SIP OF WATER: Tylenol, Carvedilol, Oxycodone, Flomax                              You may not have any metal on your body including jewelry, and body piercing             Do not wear lotions, powders, cologne, or deodorant              Men may shave face and neck.   Do not bring valuables to the hospital. [facility] IS NOT             RESPONSIBLE   FOR VALUABLES.   Bring small overnight bag day of surgery.   DO NOT BRING YOUR HOME MEDICATIONS TO THE HOSPITAL. PHARMACY WILL DISPENSE MEDICATIONS LISTED ON YOUR MEDICATION LIST TO YOU DURING YOUR ADMISSION IN THE  HOSPITAL!   Special Instructions: Bring a copy of your healthcare power of attorney and living will documents the day of surgery if you haven't scanned them before.              Please read over the following fact sheets you were given: IF YOU HAVE QUESTIONS ABOUT YOUR PRE-OP INSTRUCTIONS PLEASE CALL [phone] Contras    If you received a COVID test during your pre-op visit  it is requested that you wear a mask when out in public, stay away from anyone that may not be feeling well and notify your surgeon if you develop symptoms. If you test positive for Covid or have been in contact with anyone that has tested positive in the last 10 days please notify you surgeon.    [facility] - Preparing for Surgery Before surgery, you can play an important role.  Because skin is not sterile, your skin  needs to be as free of germs as possible.  You can reduce the number of germs on your skin by washing with CHG (chlorahexidine gluconate) soap before surgery.  CHG is an antiseptic cleaner which kills germs and bonds with the skin to continue killing germs even after washing. Please DO NOT use if you have an allergy to CHG or antibacterial soaps.  If your skin becomes reddened/irritated stop using the CHG and inform your nurse when you arrive at Short Stay. Do not shave (including legs and underarms) for at least 48 hours prior to the first CHG shower.  You may shave your face/neck.  Please follow these instructions carefully:  1.  Shower with CHG Soap the night before surgery and the  morning of surgery.  2.  If you choose to wash your hair, wash your hair first as usual with your normal  shampoo.  3.  After you shampoo, rinse your hair and body thoroughly to remove the shampoo.                             4.  Use CHG as you would any other liquid soap.  You can apply chg directly to the skin and wash.  Gently with a scrungie or clean washcloth.  5.  Apply the CHG Soap to your body ONLY FROM THE NECK DOWN.    Do   not use on face/ open                           Wound or open sores. Avoid contact with eyes, ears mouth and   genitals (private parts).                       Wash face,  Genitals (private parts) with your normal soap.             6.  Wash thoroughly, paying special attention to the area where your    surgery  will be performed.  7.  Thoroughly rinse your body with warm water from the neck down.  8.  DO NOT shower/wash with your normal soap after using and rinsing off the CHG Soap.                9.  Pat yourself dry with a clean towel.            10.  Wear clean pajamas.            11.  Place clean sheets on your bed the night of your first shower and do not  sleep with pets. Day of Surgery : Do not apply any lotions/deodorants the morning of surgery.  Please wear clean clothes to the hospital/surgery center.  FAILURE TO FOLLOW THESE INSTRUCTIONS MAY RESULT IN THE CANCELLATION OF YOUR SURGERY  PATIENT SIGNATURE_________________________________  NURSE SIGNATURE__________________________________  ________________________________________________________________________

## 2022-01-08 NOTE — Progress Notes (Addendum)
COVID Vaccine Completed: yes  Date of COVID positive in last 90 days: no  PCP - Avon Gully, MD Cardiologist - Arvilla Meres, MD  Chest x-ray - 05/28/21 Epic EKG - 05/30/21 Epic Stress Test - 06/21/20 Epic ECHO - 09/27/20 Epic Cardiac Cath - 04/18/20 Epic Pacemaker/ICD device last checked: no Spinal Cord Stimulator: no  Bowel Prep - no  Sleep Study - no CPAP -   Fasting Blood Sugar - n/a Checks Blood Sugar _____ times a day  Last dose of GLP1 agonist-  N/A GLP1 instructions:  N/A   Last dose of SGLT-2 inhibitors-  N/A SGLT-2 instructions: N/A   Blood Thinner Instructions: Aspirin Instructions: ASA 81, no instructions per pt Last Dose:  Activity level: Can go up a flight of stairs and perform activities of daily living without stopping and without symptoms of chest pain. Endorses  SOB with activity   Anesthesia review: HTN, pre DM2, CAD, CHF  Patient denies shortness of breath, fever, cough and chest pain at PAT appointment  Patient verbalized understanding of instructions that were given to them at the PAT appointment. Patient was also instructed that they will need to review over the PAT instructions again at home before surgery.

## 2022-01-09 ENCOUNTER — Encounter (HOSPITAL_COMMUNITY)
Admission: RE | Admit: 2022-01-09 | Discharge: 2022-01-09 | Disposition: A | Discharge status: Home / Self Care | Payer: Medicaid Other | Source: Ambulatory Visit | Attending: Urology | Admitting: Urology

## 2022-01-09 DIAGNOSIS — I10 Essential (primary) hypertension: Secondary | ICD-10-CM | POA: Diagnosis not present

## 2022-01-09 DIAGNOSIS — Z01812 Encounter for preprocedural laboratory examination: Secondary | ICD-10-CM | POA: Diagnosis present

## 2022-01-09 LAB — CBC
HCT: 41.6 % (ref 39.0–52.0)
Hemoglobin: 14.3 g/dL (ref 13.0–17.0)
MCH: 27.1 pg (ref 26.0–34.0)
MCHC: 34.4 g/dL (ref 30.0–36.0)
MCV: 78.9 fL — ABNORMAL LOW (ref 80.0–100.0)
Platelets: 189 10*3/uL (ref 150–400)
RBC: 5.27 MIL/uL (ref 4.22–5.81)
RDW: 13.6 % (ref 11.5–15.5)
WBC: 9.7 10*3/uL (ref 4.0–10.5)
nRBC: 0 % (ref 0.0–0.2)

## 2022-01-09 LAB — BASIC METABOLIC PANEL
Anion gap: 6 (ref 5–15)
BUN: 13 mg/dL (ref 6–20)
CO2: 22 mmol/L (ref 22–32)
Calcium: 9.3 mg/dL (ref 8.9–10.3)
Chloride: 110 mmol/L (ref 98–111)
Creatinine, Ser: 0.98 mg/dL (ref 0.61–1.24)
GFR, Estimated: >60 mL/min (ref >60)
Glucose, Bld: 87 mg/dL (ref 70–99)
Potassium: 4.1 mmol/L (ref 3.5–5.1)
Sodium: 138 mmol/L (ref 135–145)

## 2022-01-10 NOTE — Progress Notes (Signed)
Anesthesia Chart Review   Case: [id number] Date/Time: 01/17/22 0715   Procedure: BIPOLAR TRANSURETHRAL RESECTION OF THE PROSTATE (TURP)   Anesthesia type: General   Pre-op diagnosis: BENIGN PROSTATIC HYERPLASIA   Location: [place] / [facility]   Surgeons: Janith Lima, MD       DISCUSSION:61 y.o. smoker with h/o HTN, CHF, nonobstructive CAD, substance abuse/cocaine use, BPH scheduled for above procedure 01/17/2022 with Dr. Ezequiel Kayser.   Per cardiologist, Dr. Haroldine Laws, "Moderate risk from cardiac standpoint--ok to proceed"  S/p colonoscopy 11/22/2021.   Anticipate pt can proceed with planned procedure barring acute status change.   VS: There were no vitals taken for this visit.  PROVIDERS: Carrolyn Meiers, MD is PCP   Cardiologist - Glori Bickers, MD  LABS: Labs reviewed: Acceptable for surgery. (all labs ordered are listed, but only abnormal results are displayed)  Labs Reviewed - No data to display   IMAGES:   EKG:   CV: Echo 09/27/2020 1. Compared to TEE dopne 3/9//22 EF improved, decreased atrial sizes and  marked improvement in degree of MR/TR.   2. Left ventricular ejection fraction, by estimation, is 35 to 40%. The  left ventricle has moderately decreased function. The left ventricle  demonstrates global hypokinesis. The left ventricular internal cavity size  was moderately to severely dilated.  Left ventricular diastolic parameters were normal.   3. Right ventricular systolic function is normal. The right ventricular  size is normal. There is normal pulmonary artery systolic pressure.   4. Left atrial size was mildly dilated.   5. The mitral valve is abnormal. Mild mitral valve regurgitation. No  evidence of mitral stenosis.   6. The aortic valve is tricuspid. There is mild calcification of the  aortic valve. Aortic valve regurgitation is not visualized. Mild aortic  valve sclerosis is present, with no evidence of aortic valve stenosis.    7. The inferior vena cava is normal in size with greater than 50%  respiratory variability, suggesting right atrial pressure of 3 mmHg.  Past Medical History:  Diagnosis Date   Arthritis    CHF (congestive heart failure) (HCC)    Chronic back pain    Coronary artery disease    Headache    Heart valve problem    Hypertension    Pre-diabetes    Tuberculosis 1991   per pt    Past Surgical History:  Procedure Laterality Date   COLONOSCOPY WITH PROPOFOL N/A 11/22/2021   Procedure: COLONOSCOPY WITH PROPOFOL;  Surgeon: Eloise Harman, DO;  Location: [facility];  Service: Endoscopy;  Laterality: N/A;  8:30am, asa 3   POLYPECTOMY  11/22/2021   Procedure: POLYPECTOMY;  Surgeon: Eloise Harman, DO;  Location: [facility];  Service: Endoscopy;;   RIGHT/LEFT HEART CATH AND CORONARY ANGIOGRAPHY N/A 04/18/2020   Procedure: RIGHT/LEFT HEART CATH AND CORONARY ANGIOGRAPHY;  Surgeon: Jolaine Artist, MD;  Location: [facility] CV LAB;  Service: Cardiovascular;  Laterality: N/A;   TEE WITHOUT CARDIOVERSION N/A 04/18/2020   Procedure: TRANSESOPHAGEAL ECHOCARDIOGRAM (TEE);  Surgeon: Jolaine Artist, MD;  [facility]: [facility] ENDOSCOPY;  Service: Cardiovascular;  Laterality: N/A;    MEDICATIONS:  acetaminophen (TYLENOL) 500 MG tablet   aspirin EC 81 MG tablet   atorvastatin (LIPITOR) 80 MG tablet   carvedilol (COREG) 6.25 MG tablet   ENTRESTO 97-103 MG   furosemide (LASIX) 40 MG tablet   neomycin-bacitracin-polymyxin (NEOSPORIN) OINT   Oxycodone HCl 10 MG TABS   potassium chloride SA (KLOR-CON M)  20 MEQ tablet   spironolactone (ALDACTONE) 25 MG tablet   tamsulosin (FLOMAX) 0.4 MG CAPS capsule   Tetrahydrozoline HCl (VISINE OP)   No current facility-administered medications for this encounter.    Jodell Cipro Ward, PA-C [facility] Pre-Surgical Testing [phone]

## 2022-01-17 ENCOUNTER — Encounter (HOSPITAL_COMMUNITY): Payer: Self-pay | Admitting: Urology

## 2022-01-17 ENCOUNTER — Ambulatory Visit (HOSPITAL_BASED_OUTPATIENT_CLINIC_OR_DEPARTMENT_OTHER): Payer: Medicaid Other | Admitting: Certified Registered Nurse Anesthetist

## 2022-01-17 ENCOUNTER — Other Ambulatory Visit: Payer: Self-pay

## 2022-01-17 ENCOUNTER — Inpatient Hospital Stay (HOSPITAL_COMMUNITY)
Admission: AD | Admit: 2022-01-17 | Discharge: 2022-01-21 | DRG: 713 | Disposition: A | Discharge status: Home / Self Care | Payer: Medicaid Other | Attending: Urology | Admitting: Urology

## 2022-01-17 ENCOUNTER — Ambulatory Visit (HOSPITAL_COMMUNITY): Payer: Medicaid Other | Admitting: Physician Assistant

## 2022-01-17 ENCOUNTER — Encounter (HOSPITAL_COMMUNITY)
Admission: AD | Disposition: A | Discharge status: Home / Self Care | Payer: Self-pay | Source: Home / Self Care | Attending: Urology

## 2022-01-17 DIAGNOSIS — I509 Heart failure, unspecified: Secondary | ICD-10-CM | POA: Diagnosis not present

## 2022-01-17 DIAGNOSIS — F17210 Nicotine dependence, cigarettes, uncomplicated: Secondary | ICD-10-CM | POA: Diagnosis present

## 2022-01-17 DIAGNOSIS — Z8611 Personal history of tuberculosis: Secondary | ICD-10-CM

## 2022-01-17 DIAGNOSIS — N400 Enlarged prostate without lower urinary tract symptoms: Secondary | ICD-10-CM

## 2022-01-17 DIAGNOSIS — Z6831 Body mass index (BMI) 31.0-31.9, adult: Secondary | ICD-10-CM

## 2022-01-17 DIAGNOSIS — Z1211 Encounter for screening for malignant neoplasm of colon: Secondary | ICD-10-CM

## 2022-01-17 DIAGNOSIS — N401 Enlarged prostate with lower urinary tract symptoms: Principal | ICD-10-CM | POA: Diagnosis present

## 2022-01-17 DIAGNOSIS — R310 Gross hematuria: Secondary | ICD-10-CM | POA: Diagnosis present

## 2022-01-17 DIAGNOSIS — I10 Essential (primary) hypertension: Principal | ICD-10-CM

## 2022-01-17 DIAGNOSIS — E669 Obesity, unspecified: Secondary | ICD-10-CM | POA: Diagnosis present

## 2022-01-17 DIAGNOSIS — Z8042 Family history of malignant neoplasm of prostate: Secondary | ICD-10-CM

## 2022-01-17 DIAGNOSIS — N138 Other obstructive and reflux uropathy: Secondary | ICD-10-CM | POA: Diagnosis present

## 2022-01-17 DIAGNOSIS — I110 Hypertensive heart disease with heart failure: Secondary | ICD-10-CM | POA: Diagnosis not present

## 2022-01-17 DIAGNOSIS — I2510 Atherosclerotic heart disease of native coronary artery without angina pectoris: Secondary | ICD-10-CM | POA: Diagnosis present

## 2022-01-17 DIAGNOSIS — M1990 Unspecified osteoarthritis, unspecified site: Secondary | ICD-10-CM | POA: Diagnosis present

## 2022-01-17 DIAGNOSIS — Z8249 Family history of ischemic heart disease and other diseases of the circulatory system: Secondary | ICD-10-CM

## 2022-01-17 HISTORY — PX: TRANSURETHRAL RESECTION OF PROSTATE: SHX73

## 2022-01-17 HISTORY — DX: Atherosclerotic heart disease of native coronary artery without angina pectoris: I25.10

## 2022-01-17 HISTORY — DX: Prediabetes: R73.03

## 2022-01-17 HISTORY — DX: Headache, unspecified: R51.9

## 2022-01-17 HISTORY — DX: Unspecified osteoarthritis, unspecified site: M19.90

## 2022-01-17 LAB — CBC
HCT: 39.8 % (ref 39.0–52.0)
Hemoglobin: 13.7 g/dL (ref 13.0–17.0)
MCH: 27.2 pg (ref 26.0–34.0)
MCHC: 34.4 g/dL (ref 30.0–36.0)
MCV: 79.1 fL — ABNORMAL LOW (ref 80.0–100.0)
Platelets: 153 10*3/uL (ref 150–400)
RBC: 5.03 MIL/uL (ref 4.22–5.81)
RDW: 13.3 % (ref 11.5–15.5)
WBC: 7.6 10*3/uL (ref 4.0–10.5)
nRBC: 0 % (ref 0.0–0.2)

## 2022-01-17 LAB — BASIC METABOLIC PANEL
Anion gap: 6 (ref 5–15)
BUN: 17 mg/dL (ref 6–20)
CO2: 24 mmol/L (ref 22–32)
Calcium: 9 mg/dL (ref 8.9–10.3)
Chloride: 109 mmol/L (ref 98–111)
Creatinine, Ser: 0.98 mg/dL (ref 0.61–1.24)
GFR, Estimated: >60 mL/min (ref >60)
Glucose, Bld: 104 mg/dL — ABNORMAL HIGH (ref 70–99)
Potassium: 4.3 mmol/L (ref 3.5–5.1)
Sodium: 139 mmol/L (ref 135–145)

## 2022-01-17 SURGERY — TURP (TRANSURETHRAL RESECTION OF PROSTATE)
Anesthesia: General

## 2022-01-17 MED ORDER — FUROSEMIDE 40 MG PO TABS
40.0000 mg | ORAL_TABLET | Freq: Every day | ORAL | Status: DC
  Administered 2022-01-17 – 2022-01-21 (×4): 40 mg via ORAL
  Filled 2022-01-17 (×5): qty 1

## 2022-01-17 MED ORDER — HEPARIN SODIUM (PORCINE) 5000 UNIT/ML IJ SOLN
5000.0000 [IU] | Freq: Three times a day (TID) | INTRAMUSCULAR | Status: DC
  Administered 2022-01-17: 5000 [IU] via SUBCUTANEOUS
  Filled 2022-01-17 (×2): qty 1

## 2022-01-17 MED ORDER — DOCUSATE SODIUM 100 MG PO CAPS: 100.0000 mg | ORAL_CAPSULE | Freq: Every day | ORAL | 0 refills | Status: AC | PRN

## 2022-01-17 MED ORDER — LACTATED RINGERS IV SOLN: INTRAVENOUS | Status: DC

## 2022-01-17 MED ORDER — ACETAMINOPHEN 10 MG/ML IV SOLN
1000.0000 mg | Freq: Once | INTRAVENOUS | Status: DC | PRN
  Administered 2022-01-17: 1000 mg via INTRAVENOUS

## 2022-01-17 MED ORDER — OXYCODONE-ACETAMINOPHEN 5-325 MG PO TABS: 1.0000 | ORAL_TABLET | ORAL | 0 refills | Status: DC | PRN

## 2022-01-17 MED ORDER — ONDANSETRON HCL 4 MG/2ML IJ SOLN
INTRAMUSCULAR | Status: DC | PRN
  Administered 2022-01-17: 4 mg via INTRAVENOUS

## 2022-01-17 MED ORDER — SODIUM CHLORIDE 0.9 % IV SOLN: INTRAVENOUS | Status: DC

## 2022-01-17 MED ORDER — OXYCODONE-ACETAMINOPHEN 5-325 MG PO TABS: 1.0000 | ORAL_TABLET | ORAL | Status: DC | PRN

## 2022-01-17 MED ORDER — CEFAZOLIN SODIUM-DEXTROSE 2-4 GM/100ML-% IV SOLN
2.0000 g | INTRAVENOUS | Status: AC
  Administered 2022-01-17: 2 g via INTRAVENOUS
  Filled 2022-01-17: qty 100

## 2022-01-17 MED ORDER — TAMSULOSIN HCL 0.4 MG PO CAPS
0.4000 mg | ORAL_CAPSULE | Freq: Every day | ORAL | Status: DC
  Administered 2022-01-17 – 2022-01-21 (×4): 0.4 mg via ORAL
  Filled 2022-01-17 (×5): qty 1

## 2022-01-17 MED ORDER — DIPHENHYDRAMINE HCL 50 MG/ML IJ SOLN: 12.5000 mg | Freq: Four times a day (QID) | INTRAMUSCULAR | Status: DC | PRN

## 2022-01-17 MED ORDER — PROPOFOL 10 MG/ML IV BOLUS
INTRAVENOUS | Status: AC
  Filled 2022-01-17: qty 20

## 2022-01-17 MED ORDER — DOCUSATE SODIUM 100 MG PO CAPS
100.0000 mg | ORAL_CAPSULE | Freq: Two times a day (BID) | ORAL | Status: DC
  Administered 2022-01-17 – 2022-01-21 (×7): 100 mg via ORAL
  Filled 2022-01-17 (×7): qty 1

## 2022-01-17 MED ORDER — ORAL CARE MOUTH RINSE: 15.0000 mL | Freq: Once | OROMUCOSAL | Status: AC

## 2022-01-17 MED ORDER — MIDAZOLAM HCL 5 MG/5ML IJ SOLN
INTRAMUSCULAR | Status: DC | PRN
  Administered 2022-01-17: 2 mg via INTRAVENOUS

## 2022-01-17 MED ORDER — SODIUM CHLORIDE 0.9 % IV SOLN: 250.0000 mL | INTRAVENOUS | Status: DC | PRN

## 2022-01-17 MED ORDER — OXYCODONE HCL 5 MG PO TABS: 5.0000 mg | ORAL_TABLET | Freq: Once | ORAL | Status: DC | PRN

## 2022-01-17 MED ORDER — TRIPLE ANTIBIOTIC 3.5-400-5000 EX OINT: 1.0000 | TOPICAL_OINTMENT | Freq: Three times a day (TID) | CUTANEOUS | Status: DC | PRN

## 2022-01-17 MED ORDER — HYDROMORPHONE HCL 1 MG/ML IJ SOLN: 0.5000 mg | INTRAMUSCULAR | Status: DC | PRN

## 2022-01-17 MED ORDER — ZOLPIDEM TARTRATE 5 MG PO TABS: 5.0000 mg | ORAL_TABLET | Freq: Every evening | ORAL | Status: DC | PRN

## 2022-01-17 MED ORDER — SODIUM CHLORIDE 0.9 % IR SOLN
Status: DC | PRN
  Administered 2022-01-17: 36000 mL via INTRAVESICAL
  Administered 2022-01-17: 6000 mL

## 2022-01-17 MED ORDER — DIPHENHYDRAMINE HCL 12.5 MG/5ML PO ELIX: 12.5000 mg | ORAL_SOLUTION | Freq: Four times a day (QID) | ORAL | Status: DC | PRN

## 2022-01-17 MED ORDER — EPHEDRINE SULFATE-NACL 50-0.9 MG/10ML-% IV SOSY
PREFILLED_SYRINGE | INTRAVENOUS | Status: DC | PRN
  Administered 2022-01-17: 10 mg via INTRAVENOUS

## 2022-01-17 MED ORDER — ACETAMINOPHEN 10 MG/ML IV SOLN
INTRAVENOUS | Status: AC
  Filled 2022-01-17: qty 100

## 2022-01-17 MED ORDER — SACUBITRIL-VALSARTAN 97-103 MG PO TABS
1.0000 | ORAL_TABLET | Freq: Two times a day (BID) | ORAL | Status: DC
  Administered 2022-01-17 – 2022-01-21 (×7): 1 via ORAL
  Filled 2022-01-17 (×9): qty 1

## 2022-01-17 MED ORDER — ACETAMINOPHEN 325 MG PO TABS: 650.0000 mg | ORAL_TABLET | ORAL | Status: DC | PRN

## 2022-01-17 MED ORDER — SODIUM CHLORIDE 0.9% FLUSH
3.0000 mL | Freq: Two times a day (BID) | INTRAVENOUS | Status: DC
  Administered 2022-01-17 – 2022-01-20 (×5): 3 mL via INTRAVENOUS

## 2022-01-17 MED ORDER — SODIUM CHLORIDE 0.9% FLUSH: 3.0000 mL | INTRAVENOUS | Status: DC | PRN

## 2022-01-17 MED ORDER — FENTANYL CITRATE PF 50 MCG/ML IJ SOSY
PREFILLED_SYRINGE | INTRAMUSCULAR | Status: AC
  Administered 2022-01-17: 25 ug via INTRAVENOUS
  Filled 2022-01-17: qty 1

## 2022-01-17 MED ORDER — MIDAZOLAM HCL 2 MG/2ML IJ SOLN
INTRAMUSCULAR | Status: AC
  Filled 2022-01-17: qty 2

## 2022-01-17 MED ORDER — FENTANYL CITRATE PF 50 MCG/ML IJ SOSY
25.0000 ug | PREFILLED_SYRINGE | INTRAMUSCULAR | Status: DC | PRN
  Administered 2022-01-17: 25 ug via INTRAVENOUS

## 2022-01-17 MED ORDER — OXYCODONE HCL 5 MG/5ML PO SOLN: 5.0000 mg | Freq: Once | ORAL | Status: DC | PRN

## 2022-01-17 MED ORDER — PROPOFOL 10 MG/ML IV BOLUS
INTRAVENOUS | Status: DC | PRN
  Administered 2022-01-17: 180 mg via INTRAVENOUS

## 2022-01-17 MED ORDER — OXYBUTYNIN CHLORIDE ER 5 MG PO TB24
10.0000 mg | ORAL_TABLET | Freq: Every day | ORAL | Status: DC
  Administered 2022-01-17 – 2022-01-21 (×4): 10 mg via ORAL
  Filled 2022-01-17 (×5): qty 2

## 2022-01-17 MED ORDER — FENTANYL CITRATE (PF) 100 MCG/2ML IJ SOLN
INTRAMUSCULAR | Status: DC | PRN
  Administered 2022-01-17 (×2): 50 ug via INTRAVENOUS

## 2022-01-17 MED ORDER — ONDANSETRON HCL 4 MG/2ML IJ SOLN: 4.0000 mg | INTRAMUSCULAR | Status: DC | PRN

## 2022-01-17 MED ORDER — FENTANYL CITRATE (PF) 100 MCG/2ML IJ SOLN
INTRAMUSCULAR | Status: AC
  Filled 2022-01-17: qty 2

## 2022-01-17 MED ORDER — CARVEDILOL 3.125 MG PO TABS
3.1250 mg | ORAL_TABLET | Freq: Two times a day (BID) | ORAL | Status: DC
  Administered 2022-01-17 – 2022-01-21 (×7): 3.125 mg via ORAL
  Filled 2022-01-17 (×7): qty 1

## 2022-01-17 MED ORDER — ONDANSETRON HCL 4 MG/2ML IJ SOLN: 4.0000 mg | Freq: Once | INTRAMUSCULAR | Status: DC | PRN

## 2022-01-17 MED ORDER — CHLORHEXIDINE GLUCONATE 0.12 % MT SOLN
15.0000 mL | Freq: Once | OROMUCOSAL | Status: AC
  Administered 2022-01-17: 15 mL via OROMUCOSAL

## 2022-01-17 MED ORDER — SODIUM CHLORIDE 0.9 % IR SOLN
3000.0000 mL | Status: DC
  Administered 2022-01-17 – 2022-01-20 (×49): 3000 mL

## 2022-01-17 MED ORDER — LIDOCAINE 2% (20 MG/ML) 5 ML SYRINGE
INTRAMUSCULAR | Status: DC | PRN
  Administered 2022-01-17: 40 mg via INTRAVENOUS

## 2022-01-17 SURGICAL SUPPLY — 22 items
BAG URINE DRAIN 2000ML AR STRL (UROLOGICAL SUPPLIES) ×1 IMPLANT
BAG URO CATCHER STRL LF (MISCELLANEOUS) ×1 IMPLANT
CATH FOLEY 3WAY 30CC 22FR (CATHETERS) IMPLANT
CATH FOLEY 3WAY 30CC 24FR (CATHETERS)
CATH URTH STD 24FR FL 3W 2 (CATHETERS) IMPLANT
DRAPE FOOT SWITCH (DRAPES) ×1 IMPLANT
ELECT REM PT RETURN 15FT ADLT (MISCELLANEOUS) IMPLANT
GLOVE SURG LX STRL 7.5 STRW (GLOVE) ×1 IMPLANT
GOWN STRL REUS W/ TWL XL LVL3 (GOWN DISPOSABLE) ×1 IMPLANT
GOWN STRL REUS W/TWL XL LVL3 (GOWN DISPOSABLE) ×1
GUIDEWIRE STR DUAL SENSOR (WIRE) IMPLANT
HOLDER FOLEY CATH W/STRAP (MISCELLANEOUS) IMPLANT
IV CATH 14GX2 1/4 (CATHETERS) IMPLANT
KIT TURNOVER KIT A (KITS) IMPLANT
LOOP CUT BIPOLAR 24F LRG (ELECTROSURGICAL) IMPLANT
MANIFOLD NEPTUNE II (INSTRUMENTS) ×1 IMPLANT
PACK CYSTO (CUSTOM PROCEDURE TRAY) ×1 IMPLANT
SYR 30ML LL (SYRINGE) ×1 IMPLANT
SYR TOOMEY IRRIG 70ML (MISCELLANEOUS) ×1
SYRINGE TOOMEY IRRIG 70ML (MISCELLANEOUS) ×1 IMPLANT
TUBING CONNECTING 10 (TUBING) ×1 IMPLANT
TUBING UROLOGY SET (TUBING) ×1 IMPLANT

## 2022-01-17 NOTE — Anesthesia Postprocedure Evaluation (Signed)
Anesthesia Post Note  Patient: Devon Silva  Procedure(s) Performed: BIPOLAR TRANSURETHRAL RESECTION OF THE PROSTATE (TURP)     Patient location during evaluation: PACU Anesthesia Type: General Level of consciousness: awake and alert Pain management: pain level controlled Vital Signs Assessment: post-procedure vital signs reviewed and stable Respiratory status: spontaneous breathing, nonlabored ventilation, respiratory function stable and patient connected to nasal cannula oxygen Cardiovascular status: blood pressure returned to baseline and stable Postop Assessment: no apparent nausea or vomiting Anesthetic complications: no  No notable events documented.  Last Vitals:  Vitals:   01/17/22 1245 01/17/22 1307  BP: 124/84 (!) 134/95  Pulse: (!) 58 (!) 58  Resp: 16 18  Temp:  36.6 C  SpO2: 100% 98%    Last Pain:  Vitals:   01/17/22 1307  TempSrc: Oral  PainSc:                  Trevor Iha

## 2022-01-17 NOTE — Transfer of Care (Signed)
Immediate Anesthesia Transfer of Care Note  Patient: Devon Silva  Procedure(s) Performed: BIPOLAR TRANSURETHRAL RESECTION OF THE PROSTATE (TURP)  Patient Location: PACU  Anesthesia Type:General  Level of Consciousness: sedated, patient cooperative, and responds to stimulation  Airway & Oxygen Therapy: Patient Spontanous Breathing and Patient connected to face mask oxygen  Post-op Assessment: Report given to RN and Post -op Vital signs reviewed and stable  Post vital signs: Reviewed and stable  Last Vitals:  Vitals Value Taken Time  BP 147/84 01/17/22 0910  Temp    Pulse 67 01/17/22 0912  Resp 21 01/17/22 0912  SpO2 100 % 01/17/22 0912  Vitals shown include unvalidated device data.  Last Pain:  Vitals:   01/17/22 0612  PainSc: 0-No pain         Complications: No notable events documented.

## 2022-01-17 NOTE — H&P (Signed)
Office Visit Report     12/24/2021   --------------------------------------------------------------------------------   Devon Silva  MRN: [id number]  DOB: Nov 09, 1960, 61 year old Male  SSN:    PRIMARY CARE:  Tesfaye D. Legrand Rams, MD  REFERRING:  Janith Lima, MD  PROVIDER:  Daine Gravel, NP  TREATING:  Mcarthur Rossetti, PA  LOCATION:  [facility] [phone]     --------------------------------------------------------------------------------   CC/HPI: Pt presents today for pre-operative history and physical exam in anticipation of TURP by Dr. Abner Greenspan on 01/17/22. He is doing well and is without complaint.   Received cardiac clearance from Dr. Tempie Hoist.   Pt denies F/C, HA, CP, SOB, N/V, diarrhea/constipation, back pain, flank pain, hematuria, and dysuria.     HX:   Devon Silva is a 61 year old male who is seen in follow-up with BPH/LUTS.   1. BPH/LUTS:  He reported gradual decline of urinary stream in 2023.  He had an episode of urinary retention in 09/2021. He started tamsulosin 0.4 mg and was able to pass a void trial on 09/26/2021.  -PVR 10/08/2021 was 321 amount. IPSS score is 12, quality-of-life 0. He does complain of sensation of incomplete bladder emptying, frequency. He states he has had a stronger flow stream. He has 1 time nocturia.  -BPH evaluation 11/07/2021. He declined TRUS. Abdominal ultrasound for prostate size: 5.7 x 5.1 x 4.1 cm equating to 62 cc prostate. Cystoscopy with trilobar obstructing prostate with ball-valve median lobe with 1 cm intravesical protrusion. Unfortunately, uroflow did not record. PVR 339 mL. He declines Foley or CIC.   2. Prostate cancer screening: PSA on 09/26/2021 was normal at 3.2. He declines DRE in 09/2021. He does have a family history of prostate cancer in his brother.   Patient currently denies fever, chills, sweats, nausea, vomiting, abdominal or flank pain, gross hematuria or dysuria.   He has a past medical  history of CHF, hypertension, chronic back pain, chronic narcotic use.     ALLERGIES: No Known Allergies    MEDICATIONS: Tamsulosin Hcl 0.4 mg capsule 1 capsule PO Q HS  Carvedilol 6.25 mg tablet  Entresto 97 mg-103 mg tablet  Furosemide 40 mg tablet  Oxycodone Hcl 10 mg tablet  Potassium Chloride 20 meq tablet, extended release  Spironolactone 25 mg tablet     GU PSH: Cystoscopy - 11/07/2021     NON-GU PSH: None   GU PMH: BPH w/LUTS - 11/07/2021, - 10/08/2021 Encounter for Prostate Cancer screening - 11/07/2021, - 10/08/2021, - 09/26/2021 Incomplete bladder emptying - 11/07/2021, - 10/08/2021 Urinary Retention - 10/08/2021, - 09/26/2021    NON-GU PMH: Cardiac murmur, unspecified Heart disease, unspecified Hypertension    FAMILY HISTORY: 1 son - Runs in Family 3 daughters - Runs in Family Prostate Cancer - Brother Tuberculosis - Other   SOCIAL HISTORY: Marital Status: Married Preferred Language: English; Ethnicity: Not Hispanic Or Latino; Race: Black or African American Current Smoking Status: Patient does not smoke anymore.   Tobacco Use Assessment Completed: Used Tobacco in last 30 days? Has never drank.  Does not use drugs. Drinks 2 caffeinated drinks per day. Has not had a blood transfusion.     Notes: Smokes 2 cigarettes per day    REVIEW OF SYSTEMS:    GU Review Male:   Patient denies frequent urination, hard to postpone urination, burning/ pain with urination, get up at night to urinate, leakage of urine, stream starts and stops, trouble starting your stream, have to strain to urinate , erection problems,  and penile pain.  Gastrointestinal (Upper):   Patient denies nausea, vomiting, and indigestion/ heartburn.  Gastrointestinal (Lower):   Patient denies constipation and diarrhea.  Constitutional:   Patient denies fever, night sweats, weight loss, and fatigue.  Skin:   Patient denies skin rash/ lesion and itching.  Eyes:   Patient denies blurred vision and double  vision.  Ears/ Nose/ Throat:   Patient denies sore throat and sinus problems.  Hematologic/Lymphatic:   Patient denies swollen glands and easy bruising.  Cardiovascular:   Patient denies leg swelling and chest pains.  Respiratory:   Patient denies cough and shortness of breath.  Endocrine:   Patient denies excessive thirst.  Musculoskeletal:   Patient denies back pain and joint pain.  Neurological:   Patient denies headaches and dizziness.  Psychologic:   Patient denies depression and anxiety.   VITAL SIGNS:      12/24/2021 03:26 PM  Weight 220 lb / 99.79 kg  Height 70.5 in / 179.07 cm  BP 157/101 mmHg  Heart Rate 67 /min  Temperature 97.8 F / 36.5 C  BMI 31.1 kg/m   MULTI-SYSTEM PHYSICAL EXAMINATION:    Constitutional: Well-nourished. No physical deformities. Normally developed. Good grooming.  Neck: Neck symmetrical, not swollen. Normal tracheal position.  Respiratory: Normal breath sounds. No labored breathing, no use of accessory muscles.   Cardiovascular: Regular rate and rhythm. No murmur, no gallop.   Lymphatic: No enlargement of neck, axillae, groin.  Skin: No paleness, no jaundice, no cyanosis. No lesion, no ulcer, no rash.  Neurologic / Psychiatric: Oriented to time, oriented to place, oriented to person. No depression, no anxiety, no agitation.  Gastrointestinal: No mass, no tenderness, no rigidity, obese abdomen.   Eyes: Normal conjunctivae. Normal eyelids.  Ears, Nose, Mouth, and Throat: Left ear no scars, no lesions, no masses. Right ear no scars, no lesions, no masses. Nose no scars, no lesions, no masses. Normal hearing. Normal lips.  Musculoskeletal: Normal gait and station of head and neck.     Complexity of Data:  Records Review:   Previous Patient Records  Urine Test Review:   Urinalysis   12/24/21  Urinalysis  Urine Appearance Slightly Cloudy   Urine Color Yellow   Urine Glucose Neg mg/dL  Urine Bilirubin Neg mg/dL  Urine Ketones Neg mg/dL  Urine  Specific Gravity 1.025   Urine Blood Neg ery/uL  Urine pH 6.0   Urine Protein Neg mg/dL  Urine Urobilinogen 0.2 mg/dL  Urine Nitrites Neg   Urine Leukocyte Esterase Neg leu/uL  Urine WBC/hpf 0 - 5/hpf   Urine RBC/hpf NS (Not Seen)   Urine Epithelial Cells 6 - 10/hpf   Urine Bacteria NS (Not Seen)   Urine Mucous Present   Urine Yeast NS (Not Seen)   Urine Trichomonas Not Present   Urine Cystals NS (Not Seen)   Urine Casts NS (Not Seen)   Urine Sperm Present    PROCEDURES:          Urinalysis w/Scope - 81001 Dipstick Dipstick Cont'd Micro  Color: Yellow Bilirubin: Neg mg/dL WBC/hpf: 0 - 5/hpf  Appearance: Slightly Cloudy Ketones: Neg mg/dL RBC/hpf: NS (Not Seen)  Specific Gravity: 1.025 Blood: Neg ery/uL Bacteria: NS (Not Seen)  pH: 6.0 Protein: Neg mg/dL Cystals: NS (Not Seen)  Glucose: Neg mg/dL Urobilinogen: 0.2 mg/dL Casts: NS (Not Seen)    Nitrites: Neg Trichomonas: Not Present    Leukocyte Esterase: Neg leu/uL Mucous: Present      Epithelial Cells: 6 - 10/hpf  Yeast: NS (Not Seen)      Sperm: Present    ASSESSMENT:      ICD-10 Details  1 GU:   BPH w/LUTS - N40.1   2   Urinary Retention - R33.8    PLAN:           Schedule Return Visit/Planned Activity: Keep Scheduled Appointment - Schedule Surgery          Document Letter(s):  Created for Patient: Clinical Summary         Notes:   There are no changes in the patients history or physical exam since last evaluation by Dr. Abner Greenspan. Pt is scheduled to undergo TURP on 01/17/22.   Pt mentioned he may want to have another child. I told him this procedure would effect his fertility. He seemed uncertain about whether or not he wants another child so I told him I would have Dr. Abner Greenspan contact him to discuss further.   All pt's questions were answered to the best of my ability.          Next Appointment:      Next Appointment: 01/17/2022 07:30 AM    Appointment Type: Surgery     Location: [facility] [phone]    Provider: Rexene Alberts, M.D.    Reason for Visit: [facility]/OBS BIPOLAR TURP    Urology Preoperative H&P   Chief Complaint: BPH  History of Present Illness: Shiya Kilian is a 61 y.o. male with BPH here for TURP.    Past Medical History:  Diagnosis Date   Arthritis    CHF (congestive heart failure) (HCC)    Chronic back pain    Coronary artery disease    Headache    Heart valve problem    Hypertension    Pre-diabetes    Tuberculosis 1991   per pt    Past Surgical History:  Procedure Laterality Date   COLONOSCOPY WITH PROPOFOL N/A 11/22/2021   Procedure: COLONOSCOPY WITH PROPOFOL;  Surgeon: Eloise Harman, DO;  Location: [facility];  Service: Endoscopy;  Laterality: N/A;  8:30am, asa 3   POLYPECTOMY  11/22/2021   Procedure: POLYPECTOMY;  Surgeon: Eloise Harman, DO;  Location: [facility];  Service: Endoscopy;;   RIGHT/LEFT HEART CATH AND CORONARY ANGIOGRAPHY N/A 04/18/2020   Procedure: RIGHT/LEFT HEART CATH AND CORONARY ANGIOGRAPHY;  Surgeon: Jolaine Artist, MD;  Location: [place] [facility];  Service: Cardiovascular;  Laterality: N/A;   TEE WITHOUT CARDIOVERSION N/A 04/18/2020   Procedure: TRANSESOPHAGEAL ECHOCARDIOGRAM (TEE);  Surgeon: Jolaine Artist, MD;  Location: [facility];  Service: Cardiovascular;  Laterality: N/A;    Allergies: No Known Allergies  Family History  Problem Relation Age of Onset   Cancer Father    Heart disease Father    Prostate cancer Brother    Cancer Brother    Cancer - Colon Neg Hx    Colon polyps Neg Hx     Social History:  reports that he has been smoking cigarettes. He has a 5.00 pack-year smoking history. He has never used smokeless tobacco. He reports that he does not currently use alcohol after a past usage of about 2.0 standard drinks of alcohol per week. He reports that he does not currently use drugs.  ROS: A complete review of systems was performed.  All systems are negative except for pertinent  findings as noted.  Physical Exam:  Vital signs in last 24 hours: Temp:  [98.1 F (36.7 C)] 98.1 F (36.7  C) (12/08 0535) Pulse Rate:  [66] 66 (12/08 0535) Resp:  [17] 17 (12/08 0535) BP: (130)/(93) 130/93 (12/08 0535) SpO2:  [95 %] 95 % (12/08 0535) Weight:  [99.8 kg] 99.8 kg (12/08 0612) Constitutional:  Alert and oriented, No acute distress Cardiovascular: Regular rate and rhythm Respiratory: Normal respiratory effort, Lungs clear bilaterally GI: Abdomen is soft, nontender, nondistended, no abdominal masses GU: No CVA tenderness Lymphatic: No lymphadenopathy Neurologic: Grossly intact, no focal deficits Psychiatric: Normal mood and affect  Laboratory Data:  No results for input(s): "WBC", "HGB", "HCT", "PLT" in the last 72 hours.  No results for input(s): "NA", "K", "CL", "GLUCOSE", "BUN", "CALCIUM", "CREATININE" in the last 72 hours.  Invalid input(s): "CO3"   No results found for this or any previous visit (from the past 24 hour(s)). No results found for this or any previous visit (from the past 240 hour(s)).  Renal Function: No results for input(s): "CREATININE" in the last 168 hours. Estimated Creatinine Clearance: 94.9 mL/min (by C-G formula based on SCr of 0.98 mg/dL).  Radiologic Imaging: No results found.  I independently reviewed the above imaging studies.  Assessment and Plan Eldrid Varnes is a 61 y.o. male with BPH here for TURP.  Matt R. Asti Mackley MD 01/17/2022, 7:22 AM  [facility] Pager: [phone]): [phone]

## 2022-01-17 NOTE — Discharge Instructions (Signed)

## 2022-01-17 NOTE — Anesthesia Preprocedure Evaluation (Addendum)
Anesthesia Evaluation  Patient identified by MRN, date of birth, ID band Patient awake    Reviewed: Allergy & Precautions, NPO status , Patient's Chart, lab work & pertinent test results  Airway Mallampati: II  TM Distance: >3 FB Neck ROM: Full    Dental no notable dental hx. (+) Teeth Intact   Pulmonary Current Smoker and Patient abstained from smoking.   Pulmonary exam normal breath sounds clear to auscultation       Cardiovascular hypertension, Pt. on medications +CHF  Normal cardiovascular exam Rhythm:Regular Rate:Normal     Neuro/Psych    GI/Hepatic Neg liver ROS,,,  Endo/Other  negative endocrine ROS    Renal/GU Lab Results      Component                Value               Date                      CREATININE               0.98                01/09/2022                BUN                      13                  01/09/2022                NA                       138                 01/09/2022                K                        4.1                 01/09/2022                  Musculoskeletal  (+) Arthritis ,    Abdominal  (+) + obese (BMi 31.6)  Peds  Hematology Lab Results      Component                Value               Date                      WBC                      9.7                 01/09/2022                HGB                      14.3                01/09/2022                HCT  41.6                01/09/2022                MCV                      78.9 (L)            01/09/2022                PLT                      189                 01/09/2022              Anesthesia Other Findings   Reproductive/Obstetrics                             Anesthesia Physical Anesthesia Plan  ASA: 4  Anesthesia Plan: General   Post-op Pain Management:    Induction: Intravenous  PONV Risk Score and Plan: 2 and Treatment may vary due to age or medical  condition, Ondansetron and Midazolam  Airway Management Planned: LMA  Additional Equipment: None  Intra-op Plan:   Post-operative Plan:   Informed Consent: I have reviewed the patients History and Physical, chart, labs and discussed the procedure including the risks, benefits and alternatives for the proposed anesthesia with the patient or authorized representative who has indicated his/her understanding and acceptance.     Dental advisory given  Plan Discussed with:   Anesthesia Plan Comments:         Anesthesia Quick Evaluation

## 2022-01-17 NOTE — Op Note (Signed)
Operative Note  Preoperative diagnosis:  1.  BPH with bladder outlet obstruction  Postoperative diagnosis: 1.  BPH with bladder outlet obstruction  Procedure(s): 1.  Bipolar transurethral resection of prostate  Surgeon: Jettie Pagan, MD  Assistants:  None  Anesthesia:  General  Complications:  None  EBL:  2ml  Specimens: 1. Prostate chips ID Type Source Tests Collected by Time Destination  1 : Prostate Chips Tissue PATH Prostate TURP SURGICAL PATHOLOGY Jannifer Hick, MD 01/17/2022 [phone]     Drains/Catheters: 1.  22Fr 3 way catheter with 57ml water into balloon  Intraoperative findings:   Trilobar obstructing prostate with elevated bladder neck.  Ball-valve obstruction of his intravesical component of his prostate.  Elongated prostatic urethra with significant anterior prostate tissue. Wide open resection.  Excellent hemostasis.  No injury to ureters or sphincter.  Indication:  Devon Silva is a 61 y.o. male with BPH with bladder outlet obstruction presenting for transurethral resection of the prostate.  He had an episode of urinary retention in 09/2021.  He continued to have incomplete bladder emptying with PVR in the 300s.  He declined TRUS.  Abdominal ultrasound measured approximately 60 cc prostate. He has failed alpha blocker.  After thorough discussion including all relevant risk benefits and alternatives, he presents today for a bipolar TURP.  Description of procedure: The indication, alternatives, benefits and risks were discussed with the patient and informed consent was obtained.  Patient was brought to the operating room table, positioned supine, secured with a safety strap.  Pneumatic compression devices were placed on the lower extremities.  After the administration of intravenous antibiotics and general anesthesia, the patient was repositioned into the dorsal lithotomy position.  All pressure points were carefully padded.  A rectal examination was performed  confirming a smooth symmetric enlarged gland.  The genitalia were prepped and draped in standard sterile manner.  A timeout was completed, verifying the correct patient, surgical procedure and positioning prior to beginning the procedure.  Isotonic sodium chloride was used for irrigation.  A 26 French continuous-flow resectoscope sheath with the visual obturator and a 30 degree lens was advanced under direct vision into the bladder.  The anterior urethra appeared normal in its entirety.The prostatic urethra was elongated with trilobar hyperplasia.  He did have an elevated bladder neck on cystoscopic evaluation, his bladder capacity appeared normal, the bladder wall was noted to expand symmetrically in all dimensions.  There were no tumors, stones or foreign bodies present. The bladder was trabeculated with normal-appearing mucosa.  Both ureteral orifices were in their normal anatomic positions with clear urinary reflux noted bilaterally.  The obturator was removed and replaced by the working element with a resection loop.  The location of the ureteral orifices and the prostatic configuration were again confirmed.  Starting at the bladder neck and proceeding distally to the verumontanum a transurethral section of the prostate was performed using bipolar using energy of 4 and 5 for cutting and coagulation, respectively.  The procedure began at the bladder neck at the 5 o'clock and 7 o'clock positions and carefully carried distally to the verumontanum, resecting the intervening prostatic adenoma.  Next the left lateral lobe was resected to the level of the transverse capsular fibers.  The identical procedure was performed on the right lobe.  Attention was then directed anteriorly and the resection was completed from the 10 o'clock to 2 o'clock positions.  All bleeding vessels were fulgurated achieving meticulous hemostasis.  The bladder was irrigated with a Toomey syringe, ensuring  removal of all prostate chips  which were sent to pathology for evaluation.  Having completed the resection and the chips removed, we again confirmed hemostasis with the loop with coagulating current.  Upon completion of the entire procedure, the bladder and posterior urethra were reexamined, confirming open prostatic urethra and bladder neck without evidence of bleeding or perforation.  Both ureteral orifices and the external sphincter were noted to be intact.  The resectoscope was withdrawn under direct vision and a 22 French three-way Foley catheter with a 30 cc balloon was inserted into the bladder.  The balloon was inflated with 30 cc of sterile water and the catheter was placed on gentle traction.  After multiple manual irrigations ensuring clear return of the irrigant, the procedure was terminated.  The catheter was attached to a drainage bag and continuous bladder irrigation was started with normal saline.  The patient was positioned supine.  At the end of the procedure, all counts were correct.  Patient tolerated the procedure well and was taken to the recovery room satisfactory condition.  Plan: Continuous bladder irrigation overnight with gentle Foley traction.  Plan to discharge home tomorrow with Foley catheter in place and void trial in the office in 3 days.  Matt R. Geordie Nooney MD [facility]  Pager: [phone]

## 2022-01-17 NOTE — Anesthesia Procedure Notes (Signed)
Procedure Name: LMA Insertion Date/Time: 01/17/2022 7:45 AM  Performed by: Kizzie Fantasia, CRNAPre-anesthesia Checklist: Patient identified, Emergency Drugs available, Suction available, Patient being monitored and Timeout performed Patient Re-evaluated:Patient Re-evaluated prior to induction Oxygen Delivery Method: Circle system utilized Preoxygenation: Pre-oxygenation with 100% oxygen Induction Type: IV induction Ventilation: Mask ventilation without difficulty LMA: LMA inserted LMA Size: 5.0 Number of attempts: 1 Placement Confirmation: positive ETCO2 and breath sounds checked- equal and bilateral Tube secured with: Tape Dental Injury: Teeth and Oropharynx as per pre-operative assessment

## 2022-01-18 ENCOUNTER — Encounter (HOSPITAL_COMMUNITY): Payer: Self-pay | Admitting: Urology

## 2022-01-18 LAB — BASIC METABOLIC PANEL
Anion gap: 8 (ref 5–15)
BUN: 16 mg/dL (ref 6–20)
CO2: 22 mmol/L (ref 22–32)
Calcium: 9 mg/dL (ref 8.9–10.3)
Chloride: 108 mmol/L (ref 98–111)
Creatinine, Ser: 0.99 mg/dL (ref 0.61–1.24)
GFR, Estimated: >60 mL/min (ref >60)
Glucose, Bld: 121 mg/dL — ABNORMAL HIGH (ref 70–99)
Potassium: 4.3 mmol/L (ref 3.5–5.1)
Sodium: 138 mmol/L (ref 135–145)

## 2022-01-18 LAB — CBC
HCT: 37.2 % — ABNORMAL LOW (ref 39.0–52.0)
Hemoglobin: 12.8 g/dL — ABNORMAL LOW (ref 13.0–17.0)
MCH: 27.3 pg (ref 26.0–34.0)
MCHC: 34.4 g/dL (ref 30.0–36.0)
MCV: 79.3 fL — ABNORMAL LOW (ref 80.0–100.0)
Platelets: 182 10*3/uL (ref 150–400)
RBC: 4.69 MIL/uL (ref 4.22–5.81)
RDW: 13.4 % (ref 11.5–15.5)
WBC: 13.1 10*3/uL — ABNORMAL HIGH (ref 4.0–10.5)
nRBC: 0 % (ref 0.0–0.2)

## 2022-01-18 NOTE — Progress Notes (Signed)
   01/18/22 1925  Urethral Catheter M. Gay MD Latex;Triple-lumen 22 Fr.  Placement Date/Time: 01/17/22 0855   Person Inserting LDA: Burman Foster MD  Patient Location at Time of Insertion [facility], Department, Room Number): [facility] OR 11  Catheter placed prior to periop?: No  Inserted prior to hospital arrival?: No  Catheter Type: Late...  Input (mL) 100 mL  Output (mL) 110 mL  Urine Characteristics  Urine Color Red  Urine Appearance Bloody;Blood clots

## 2022-01-18 NOTE — Progress Notes (Signed)
   01/18/22 0132  Urethral Catheter M. Gay MD Latex;Triple-lumen 22 Fr.  Placement Date/Time: 01/17/22 0855   Person Inserting LDA: Burman Foster MD  Patient Location at Time of Insertion [facility], Department, Room Number): [facility] OR 11  Catheter placed prior to periop?: No  Inserted prior to hospital arrival?: No  Catheter Type: Late...  Input (mL) 120 mL  Output (mL) 1910 mL  Urine Characteristics  Urinary Incontinence No  Urine Color Red  Urine Appearance Blood clots;Bloody   Pt complained of pain. Manually irrigated the catheter. Patient verbalized feeling of relief after.

## 2022-01-18 NOTE — Progress Notes (Signed)
Patient ID: Devon Silva, male   DOB: [birth year], 61 y.o.   MRN: [id number]  1 Day Post-Op Subjective: Pt required hand irrigation with removal of clots multiple times overnight.  CBI still on brisk drip.  Objective: Vital signs in last 24 hours: Temp:  [96.9 F (36.1 C)-98.7 F (37.1 C)] 98.7 F (37.1 C) (12/09 0337) Pulse Rate:  [49-68] 59 (12/09 0337) Resp:  [10-20] 14 (12/09 0337) BP: (101-147)/(75-95) 133/81 (12/09 0337) SpO2:  [94 %-100 %] 94 % (12/09 0337)  Intake/Output from previous day: 12/08 0701 - 12/09 0700 In: 48150.3 [P.O.:670; I.V.:1590.3; IV Piggyback:100] Out: 55732 [Urine:57500] Intake/Output this shift: No intake/output data recorded.  Physical Exam:  General: Alert and oriented GU: I irrigated a few remaining small clots from bladder.  Urine still fairly red.  CBI restarted at moderate rate and catheter placed on traction.  Lab Results: Recent Labs    01/17/22 0951 01/18/22 0430  HGB 13.7 12.8*  HCT 39.8 37.2*   BMET Recent Labs    01/17/22 1057 01/18/22 0430  NA 139 138  K 4.3 4.3  CL 109 108  CO2 24 22  GLUCOSE 104* 121*  BUN 17 16  CREATININE 0.98 0.99  CALCIUM 9.0 9.0     Studies/Results: No results found.  Assessment/Plan: POD # 1 s/p TURP - Continue CBI and catheter placed on traction.  Will titrate CBI down as tolerated and re-evaluate later today.   LOS: 0 days   Crecencio Mc 01/18/2022, 8:33 AM

## 2022-01-18 NOTE — Progress Notes (Signed)
   01/18/22 2200  Urethral Catheter M. Gay MD Latex;Triple-lumen 22 Fr.  Placement Date/Time: 01/17/22 0855   Person Inserting LDA: Burman Foster MD  Patient Location at Time of Insertion [facility], Department, Room Number): [facility] OR 11  Catheter placed prior to periop?: No  Inserted prior to hospital arrival?: No  Catheter Type: Late...  Input (mL) 150 mL  Output (mL) 150 mL  Urine Characteristics  Urine Color Red  Urine Appearance Blood clots;Bloody

## 2022-01-18 NOTE — Progress Notes (Signed)
   01/18/22 0550  Urethral Catheter M. Gay MD Latex;Triple-lumen 22 Fr.  Placement Date/Time: 01/17/22 0855   Person Inserting LDA: Burman Foster MD  Patient Location at Time of Insertion [facility], Department, Room Number): [facility] OR 11  Catheter placed prior to periop?: No  Inserted prior to hospital arrival?: No  Catheter Type: Late...  Input (mL) 120 mL  Output (mL) 150 mL  Urine Characteristics  Urine Color Red  Urine Appearance Bloody;Blood clots

## 2022-01-18 NOTE — Progress Notes (Signed)
Mobility Specialist - Progress Note   01/18/22 1339  Mobility  Activity Ambulated independently in hallway  Level of Assistance Independent after set-up  Assistive Device None  Distance Ambulated (ft) 350 ft  Range of Motion/Exercises Active  Activity Response Tolerated well  Mobility Referral Yes  $Mobility charge 1 Mobility   Pt was found in bed and agreeable to ambulate. Had no complaints during session and at EOS returned to bed with necessities in reach and son in room.  [name] Mobility Specialist

## 2022-01-18 NOTE — TOC Initial Note (Signed)
Transition of Care [facility]) - Initial/Assessment Note    Patient Details  Name: Devon Silva MRN: [id number] Date of Birth: 06-Jul-1960  Transition of Care [facility]) CM/SW Contact:    Golda Acre, RN Phone Number: 01/18/2022, 3:20 PM  Clinical Narrative:                  Transition of Care [facility]) Screening Note   Patient Details  Name: Devon Silva Date of Birth: December 03, 1960   Transition of Care [facility]) CM/SW Contact:    Golda Acre, RN Phone Number: 01/18/2022, 3:20 PM    Transition of Care Department [facility]) has reviewed patient and no TOC needs have been identified at this time. We will continue to monitor patient advancement through interdisciplinary progression rounds. If new patient transition needs arise, please place a TOC consult.    Expected Discharge Plan: Home/Self Care Barriers to Discharge: Continued Medical Work up   Patient Goals and CMS Choice Patient states their goals for this hospitalization and ongoing recovery are:: to go home      Expected Discharge Plan and Services Expected Discharge Plan: Home/Self Care   Discharge Planning Services: CM Consult   Living arrangements for the past 2 months: Single Family Home                                      Prior Living Arrangements/Services Living arrangements for the past 2 months: Single Family Home Lives with:: Spouse Patient language and need for interpreter reviewed:: Yes Do you feel safe going back to the place where you live?: Yes            Criminal Activity/Legal Involvement Pertinent to Current Situation/Hospitalization: No - Comment as needed  Activities of Daily Living Home Assistive Devices/Equipment: None ADL Screening (condition at time of admission) Patient's cognitive ability adequate to safely complete daily activities?: Yes Is the patient deaf or have difficulty hearing?: No Does the patient have difficulty seeing, even when wearing glasses/contacts?:  No Does the patient have difficulty concentrating, remembering, or making decisions?: No Patient able to express need for assistance with ADLs?: No Does the patient have difficulty dressing or bathing?: No Independently performs ADLs?: Yes (appropriate for developmental age) Does the patient have difficulty walking or climbing stairs?: No Weakness of Legs: None Weakness of Arms/Hands: None  Permission Sought/Granted                  Emotional Assessment Appearance:: Appears stated age Attitude/Demeanor/Rapport: Engaged Affect (typically observed): Calm Orientation: : Oriented to Self, Oriented to Place, Oriented to  Time, Oriented to Situation Alcohol / Substance Use: Not Applicable Psych Involvement: No (comment)  Admission diagnosis:  BPH (benign prostatic hyperplasia) [N40.0] Patient Active Problem List   Diagnosis Date Noted   BPH (benign prostatic hyperplasia) 01/17/2022   Right heart failure (HCC) 04/04/2020   Leukocytosis 04/04/2020   Substance abuse (HCC) 04/04/2020   Acute congestive heart failure (HCC)    Acute on chronic combined systolic and diastolic CHF (congestive heart failure) (HCC) 04/03/2020   Acute respiratory failure with hypoxia (HCC)    Essential hypertension 03/05/2019   Hyperlipidemia 03/05/2019   Tobacco use disorder 03/05/2019   Prediabetes 03/05/2019   PCP:  Benetta Spar, MD Pharmacy:   [facility] [address] [place] - [address] [place] [address] Phone: [phone] Fax: [phone]  [facility] -  [place], [place] - [address] [place] [address] Phone: [phone] Fax: [phone]  [facility] - [place], [place] - [address] [place] [address] Phone: [phone] Fax: [phone]  [facility] [address] [place] [address] Phone: [phone] Fax:  [phone]  [place] [facility] [address] [place] [address] Phone: [phone] Fax: [phone]     Social Determinants of Health (SDOH) Interventions    Readmission Risk Interventions   No data to display

## 2022-01-19 DIAGNOSIS — E669 Obesity, unspecified: Secondary | ICD-10-CM | POA: Diagnosis present

## 2022-01-19 DIAGNOSIS — N138 Other obstructive and reflux uropathy: Secondary | ICD-10-CM | POA: Diagnosis present

## 2022-01-19 DIAGNOSIS — Z8611 Personal history of tuberculosis: Secondary | ICD-10-CM | POA: Diagnosis not present

## 2022-01-19 DIAGNOSIS — R310 Gross hematuria: Secondary | ICD-10-CM | POA: Diagnosis present

## 2022-01-19 DIAGNOSIS — M1990 Unspecified osteoarthritis, unspecified site: Secondary | ICD-10-CM | POA: Diagnosis present

## 2022-01-19 DIAGNOSIS — N400 Enlarged prostate without lower urinary tract symptoms: Secondary | ICD-10-CM | POA: Diagnosis present

## 2022-01-19 DIAGNOSIS — I2510 Atherosclerotic heart disease of native coronary artery without angina pectoris: Secondary | ICD-10-CM | POA: Diagnosis present

## 2022-01-19 DIAGNOSIS — R338 Other retention of urine: Secondary | ICD-10-CM | POA: Diagnosis not present

## 2022-01-19 DIAGNOSIS — N401 Enlarged prostate with lower urinary tract symptoms: Secondary | ICD-10-CM | POA: Diagnosis present

## 2022-01-19 DIAGNOSIS — Z8042 Family history of malignant neoplasm of prostate: Secondary | ICD-10-CM | POA: Diagnosis not present

## 2022-01-19 DIAGNOSIS — I110 Hypertensive heart disease with heart failure: Secondary | ICD-10-CM | POA: Diagnosis present

## 2022-01-19 DIAGNOSIS — F17210 Nicotine dependence, cigarettes, uncomplicated: Secondary | ICD-10-CM | POA: Diagnosis present

## 2022-01-19 DIAGNOSIS — I509 Heart failure, unspecified: Secondary | ICD-10-CM | POA: Diagnosis present

## 2022-01-19 DIAGNOSIS — Z8249 Family history of ischemic heart disease and other diseases of the circulatory system: Secondary | ICD-10-CM | POA: Diagnosis not present

## 2022-01-19 DIAGNOSIS — Z6831 Body mass index (BMI) 31.0-31.9, adult: Secondary | ICD-10-CM | POA: Diagnosis not present

## 2022-01-19 LAB — HEMOGLOBIN AND HEMATOCRIT, BLOOD
HCT: 31.8 % — ABNORMAL LOW (ref 39.0–52.0)
Hemoglobin: 11 g/dL — ABNORMAL LOW (ref 13.0–17.0)

## 2022-01-19 MED ORDER — CHLORHEXIDINE GLUCONATE CLOTH 2 % EX PADS
6.0000 | MEDICATED_PAD | Freq: Every day | CUTANEOUS | Status: DC
  Administered 2022-01-19: 6 via TOPICAL

## 2022-01-19 NOTE — Progress Notes (Signed)
   01/19/22 2300  Urethral Catheter M. Gay MD Latex;Triple-lumen 22 Fr.  Placement Date/Time: 01/17/22 0855   Person Inserting LDA: Burman Foster MD  Patient Location at Time of Insertion [facility], Department, Room Number): [facility] OR 11  Catheter placed prior to periop?: No  Inserted prior to hospital arrival?: No  Catheter Type: Late...  Input (mL) 60 mL  Output (mL) 65 mL  Urine Characteristics  Urine Color Red  Urine Appearance Bloody;Blood clots

## 2022-01-19 NOTE — Progress Notes (Signed)
   01/19/22 2212  Urethral Catheter M. Gay MD Latex;Triple-lumen 22 Fr.  Placement Date/Time: 01/17/22 0855   Person Inserting LDA: Burman Foster MD  Patient Location at Time of Insertion [facility], Department, Room Number): [facility] OR 11  Catheter placed prior to periop?: No  Inserted prior to hospital arrival?: No  Catheter Type: Late...  Input (mL) 60 mL  Output (mL) 70 mL  Urine Characteristics  Urine Color Red  Urine Appearance Blood clots

## 2022-01-19 NOTE — Progress Notes (Signed)
Patient states his catheter needed to be irrigated, feels there are clots.  RN irrigated catheter with approximately 100 mls saline, did retrieve several moderate sized blood clots.  Urine flowly freely after this.  MD at bedside.

## 2022-01-19 NOTE — Progress Notes (Signed)
Hand irrigation by RN, removed multiple moderate sized clots.

## 2022-01-19 NOTE — Progress Notes (Signed)
Patient ID: Devon Silva, male   DOB: 07-15-1960, 61 y.o.   MRN: [id number]   2 Days Post-Op Subjective: Pt without new complaints.  I had placed him on firm traction yesterday afternoon but this apparently became dislodged in the night. Required hand irrigation 3 times overnight with multiple clots removed with the most recent irrigation this morning with a more moderate amount of clots (less than yesterday).  Objective: Vital signs in last 24 hours: Temp:  [97.9 F (36.6 C)-98.2 F (36.8 C)] 98.2 F (36.8 C) (12/10 0519) Pulse Rate:  [73-82] 73 (12/10 0519) Resp:  [17-18] 17 (12/10 0519) BP: (93-110)/(64-72) 93/64 (12/10 0519) SpO2:  [96 %-97 %] 97 % (12/10 0519)  Intake/Output from previous day: 12/09 0701 - 12/10 0700 In: 21224 [P.O.:600] Out: 53790 [Urine:53790] Intake/Output this shift: Total I/O In: 150 [Other:150] Out: 1900 [Urine:1900]  Physical Exam:  General: Alert and oriented GU: Urine very light pink on now a slow/medium drip  Lab Results: Recent Labs    01/17/22 0951 01/18/22 0430 01/19/22 0407  HGB 13.7 12.8* 11.0*  HCT 39.8 37.2* 31.8*   BMET Recent Labs    01/17/22 1057 01/18/22 0430  NA 139 138  K 4.3 4.3  CL 109 108  CO2 24 22  GLUCOSE 104* 121*  BUN 17 16  CREATININE 0.98 0.99  CALCIUM 9.0 9.0     Studies/Results: No results found.  Assessment/Plan: POD # 2 s/p TURP - Still requiring CBI but will hopefully be able to titrate off later today.  Anticipate having him stay tonight to ensure his urine remains appropriately light off CBI before discharge.   LOS: 0 days   Crecencio Mc 01/19/2022, 7:58 AM

## 2022-01-19 NOTE — Progress Notes (Signed)
Hand irrigation by RN removing multiple moderate sized clots.

## 2022-01-20 ENCOUNTER — Encounter (HOSPITAL_COMMUNITY)
Admission: AD | Disposition: A | Discharge status: Home / Self Care | Payer: Self-pay | Source: Home / Self Care | Attending: Urology

## 2022-01-20 ENCOUNTER — Encounter (HOSPITAL_COMMUNITY): Payer: Self-pay | Admitting: Urology

## 2022-01-20 ENCOUNTER — Inpatient Hospital Stay (HOSPITAL_COMMUNITY): Payer: Medicaid Other | Admitting: Anesthesiology

## 2022-01-20 DIAGNOSIS — I110 Hypertensive heart disease with heart failure: Secondary | ICD-10-CM

## 2022-01-20 DIAGNOSIS — I2510 Atherosclerotic heart disease of native coronary artery without angina pectoris: Secondary | ICD-10-CM

## 2022-01-20 DIAGNOSIS — I509 Heart failure, unspecified: Secondary | ICD-10-CM

## 2022-01-20 DIAGNOSIS — R338 Other retention of urine: Secondary | ICD-10-CM

## 2022-01-20 DIAGNOSIS — F17210 Nicotine dependence, cigarettes, uncomplicated: Secondary | ICD-10-CM

## 2022-01-20 HISTORY — PX: CYSTOSCOPY WITH FULGERATION: SHX6638

## 2022-01-20 LAB — HEMOGLOBIN AND HEMATOCRIT, BLOOD
HCT: 28.3 % — ABNORMAL LOW (ref 39.0–52.0)
HCT: 31.6 % — ABNORMAL LOW (ref 39.0–52.0)
Hemoglobin: 10.1 g/dL — ABNORMAL LOW (ref 13.0–17.0)
Hemoglobin: 11 g/dL — ABNORMAL LOW (ref 13.0–17.0)

## 2022-01-20 LAB — SURGICAL PATHOLOGY

## 2022-01-20 SURGERY — CYSTOSCOPY, WITH BLADDER FULGURATION
Anesthesia: General

## 2022-01-20 MED ORDER — FENTANYL CITRATE (PF) 100 MCG/2ML IJ SOLN
INTRAMUSCULAR | Status: AC
  Filled 2022-01-20: qty 2

## 2022-01-20 MED ORDER — AMISULPRIDE (ANTIEMETIC) 5 MG/2ML IV SOLN: 10.0000 mg | Freq: Once | INTRAVENOUS | Status: DC | PRN

## 2022-01-20 MED ORDER — MIDAZOLAM HCL 2 MG/2ML IJ SOLN
INTRAMUSCULAR | Status: AC
  Filled 2022-01-20: qty 2

## 2022-01-20 MED ORDER — PROPOFOL 10 MG/ML IV BOLUS
INTRAVENOUS | Status: DC | PRN
  Administered 2022-01-20: 200 mg via INTRAVENOUS

## 2022-01-20 MED ORDER — LIDOCAINE 2% (20 MG/ML) 5 ML SYRINGE
INTRAMUSCULAR | Status: DC | PRN
  Administered 2022-01-20: 100 mg via INTRAVENOUS

## 2022-01-20 MED ORDER — FENTANYL CITRATE PF 50 MCG/ML IJ SOSY: 25.0000 ug | PREFILLED_SYRINGE | INTRAMUSCULAR | Status: DC | PRN

## 2022-01-20 MED ORDER — ACETAMINOPHEN 500 MG PO TABS
1000.0000 mg | ORAL_TABLET | Freq: Once | ORAL | Status: AC
  Administered 2022-01-20: 1000 mg via ORAL
  Filled 2022-01-20: qty 2

## 2022-01-20 MED ORDER — FENTANYL CITRATE (PF) 250 MCG/5ML IJ SOLN
INTRAMUSCULAR | Status: DC | PRN
  Administered 2022-01-20 (×3): 50 ug via INTRAVENOUS

## 2022-01-20 MED ORDER — ONDANSETRON HCL 4 MG/2ML IJ SOLN
INTRAMUSCULAR | Status: DC | PRN
  Administered 2022-01-20: 4 mg via INTRAVENOUS

## 2022-01-20 MED ORDER — OXYCODONE HCL 5 MG/5ML PO SOLN: 5.0000 mg | Freq: Once | ORAL | Status: DC | PRN

## 2022-01-20 MED ORDER — ESMOLOL HCL 100 MG/10ML IV SOLN
INTRAVENOUS | Status: AC
  Filled 2022-01-20: qty 10

## 2022-01-20 MED ORDER — CEFAZOLIN SODIUM-DEXTROSE 2-4 GM/100ML-% IV SOLN
INTRAVENOUS | Status: AC
  Filled 2022-01-20: qty 100

## 2022-01-20 MED ORDER — LABETALOL HCL 5 MG/ML IV SOLN
INTRAVENOUS | Status: AC
  Filled 2022-01-20: qty 4

## 2022-01-20 MED ORDER — LIP MEDEX EX OINT
TOPICAL_OINTMENT | CUTANEOUS | Status: AC
  Filled 2022-01-20: qty 7

## 2022-01-20 MED ORDER — ESMOLOL HCL 100 MG/10ML IV SOLN
INTRAVENOUS | Status: DC | PRN
  Administered 2022-01-20: 30 mg via INTRAVENOUS

## 2022-01-20 MED ORDER — PHENYLEPHRINE HCL-NACL 20-0.9 MG/250ML-% IV SOLN
INTRAVENOUS | Status: DC | PRN
  Administered 2022-01-20: 25 ug/min via INTRAVENOUS

## 2022-01-20 MED ORDER — DEXAMETHASONE SODIUM PHOSPHATE 10 MG/ML IJ SOLN
INTRAMUSCULAR | Status: DC | PRN
  Administered 2022-01-20: 10 mg via INTRAVENOUS

## 2022-01-20 MED ORDER — DEXTROSE 5 % IV SOLN: 2000.0000 mg | Freq: Once | INTRAVENOUS | Status: DC

## 2022-01-20 MED ORDER — OXYCODONE HCL 5 MG PO TABS: 5.0000 mg | ORAL_TABLET | Freq: Once | ORAL | Status: DC | PRN

## 2022-01-20 MED ORDER — MIDAZOLAM HCL 2 MG/2ML IJ SOLN
INTRAMUSCULAR | Status: DC | PRN
  Administered 2022-01-20 (×2): 1 mg via INTRAVENOUS

## 2022-01-20 MED ORDER — LABETALOL HCL 5 MG/ML IV SOLN
5.0000 mg | INTRAVENOUS | Status: DC | PRN
  Administered 2022-01-20: 5 mg via INTRAVENOUS

## 2022-01-20 MED ORDER — CEFAZOLIN SODIUM-DEXTROSE 2-4 GM/100ML-% IV SOLN
2.0000 g | Freq: Once | INTRAVENOUS | Status: AC
  Administered 2022-01-20: 2 g via INTRAVENOUS

## 2022-01-20 MED ORDER — PHENYLEPHRINE 80 MCG/ML (10ML) SYRINGE FOR IV PUSH (FOR BLOOD PRESSURE SUPPORT)
PREFILLED_SYRINGE | INTRAVENOUS | Status: DC | PRN
  Administered 2022-01-20: 160 ug via INTRAVENOUS
  Administered 2022-01-20: 80 ug via INTRAVENOUS

## 2022-01-20 MED ORDER — LACTATED RINGERS IV SOLN: INTRAVENOUS | Status: DC

## 2022-01-20 SURGICAL SUPPLY — 19 items
BAG URINE DRAIN 2000ML AR STRL (UROLOGICAL SUPPLIES) IMPLANT
BAG URO CATCHER STRL LF (MISCELLANEOUS) ×1 IMPLANT
DRAPE FOOT SWITCH (DRAPES) ×1 IMPLANT
ELECT REM PT RETURN 15FT ADLT (MISCELLANEOUS) ×1 IMPLANT
EVACUATOR MICROVAS BLADDER (UROLOGICAL SUPPLIES) IMPLANT
GLOVE SURG LX STRL 7.5 STRW (GLOVE) ×1 IMPLANT
GOWN STRL REUS W/ TWL XL LVL3 (GOWN DISPOSABLE) ×1 IMPLANT
GOWN STRL REUS W/TWL XL LVL3 (GOWN DISPOSABLE) ×1
HOLDER FOLEY CATH W/STRAP (MISCELLANEOUS) IMPLANT
IV NS IRRIG 3000ML ARTHROMATIC (IV SOLUTION) IMPLANT
KIT TURNOVER KIT A (KITS) IMPLANT
LOOP CUT BIPOLAR 24F LRG (ELECTROSURGICAL) IMPLANT
MANIFOLD NEPTUNE II (INSTRUMENTS) ×1 IMPLANT
PACK CYSTO (CUSTOM PROCEDURE TRAY) ×1 IMPLANT
PENCIL SMOKE EVACUATOR (MISCELLANEOUS) IMPLANT
SYR TOOMEY IRRIG 70ML (MISCELLANEOUS)
SYRINGE TOOMEY IRRIG 70ML (MISCELLANEOUS) IMPLANT
TUBING CONNECTING 10 (TUBING) ×1 IMPLANT
TUBING UROLOGY SET (TUBING) ×1 IMPLANT

## 2022-01-20 NOTE — Op Note (Signed)
Operative Note  Preoperative diagnosis:  1.  Clot retention  Postoperative diagnosis: 1.  Same  Procedure(s): 1.  Cystoscopy with clot evacuation 2. Fulguration of the prostate  Surgeon: Jettie Pagan, MD  Assistants:  None  Anesthesia:  General  Complications:  None  EBL:  61ml  Specimens: 1. None  Drains/Catheters: 1.  22Fr 3 way catheter with 64ml water into balloon  Intraoperative findings:   Approximately 200cc well formed solidified clot in his bladder, resected and irrigated away. Similar about 30 solidified clot in the prostate, irrigated and evacuated. No one area of bleeding, slight ooze of prostate at bladder neck and apex which were fulgurated. Excellent hemostasis.  Indication:  Devon Silva is a 61 y.o. male with BPH who underwent TURP and presents today with persistent gross hematuria. He is here for cystoscopy with clot evacuation and fulguration of prostate.  Description of procedure: The indication, alternatives, benefits and risks were discussed with the patient and informed consent was obtained.  Patient was brought to the operating room table, positioned supine, secured with a safety strap.  Pneumatic compression devices were placed on the lower extremities.  After the administration of intravenous antibiotics and general anesthesia, the patient was repositioned into the dorsal lithotomy position.  All pressure points were carefully padded.  A rectal examination was performed confirming a smooth symmetric enlarged gland.  The genitalia were prepped and draped in standard sterile manner.  A timeout was completed, verifying the correct patient, surgical procedure and positioning prior to beginning the procedure.  Isotonic sodium chloride was used for irrigation.  A 26 French continuous-flow resectoscope sheath with the visual obturator and a 30 degree lens was advanced under direct vision into the bladder.    Immediately apparent was that his bladder was full  of well-formed solidified clot.  I irrigated the portions away however the well-formed clot would not be evacuated.  Thus I resected the clot into smaller pieces and was ultimately able to evacuate all of his clot in his bladder.  The bladder was inspected, there is no evidence of any injury in the bladder.  Bilateral ureteral orifices were normal orthotopic position with clear reflux bilaterally.  He similarly had approximately 30 cc well-formed clot lining his prostate bed.  I was able to resect this and bluntly sweep this away.  I was able to get down to the prostate and there is no 1 area of bleeding however I general it is at the bladder neck and apex of the prostate.  These areas will fulgurated.  There is no bleeding.  Excellent hemostasis was obtained.  I then introduced a 22 [place] three-way Foley catheter and inserted 50 cc of water into the balloon.  I then placed this on traction.  I irrigated the scope with return of clear effluent.  Patient tolerated the procedure well and was awoken from anesthesia.  At the end of the procedure, all counts were correct.  Patient tolerated the procedure well and was taken to the recovery room satisfactory condition.  Plan: Continuous bladder irrigation overnight with gentle Foley traction.  Plan to discharge home tomorrow with Foley catheter in place and void trial in the office in 3 days.  Matt R. Reagan Behlke MD [facility]  Pager: [phone]

## 2022-01-20 NOTE — Transfer of Care (Signed)
Immediate Anesthesia Transfer of Care Note  Patient: Devon Silva  Procedure(s) Performed: CYSTOSCOPY WITH FULGERATION clot evacuation  Patient Location: PACU  Anesthesia Type:General  Level of Consciousness: awake, alert , oriented, and patient cooperative  Airway & Oxygen Therapy: Patient Spontanous Breathing and Patient connected to face mask oxygen  Post-op Assessment: Report given to RN and Post -op Vital signs reviewed and stable  Post vital signs: Reviewed and stable  Last Vitals:  Vitals Value Taken Time  BP 161/105 01/20/22 1841  Temp    Pulse 89 01/20/22 1843  Resp 14 01/20/22 1843  SpO2 100 % 01/20/22 1843  Vitals shown include unvalidated device data.  Last Pain:  Vitals:   01/20/22 1531  TempSrc:   PainSc: 0-No pain      Patients Stated Pain Goal: 3 (01/17/22 1015)  Complications: No notable events documented.

## 2022-01-20 NOTE — Anesthesia Preprocedure Evaluation (Addendum)
Anesthesia Evaluation  Patient identified by MRN, date of birth, ID band Patient awake    Reviewed: Allergy & Precautions, NPO status , Patient's Chart, lab work & pertinent test results, reviewed documented beta blocker date and time   History of Anesthesia Complications Negative for: history of anesthetic complications  Airway Mallampati: II  TM Distance: >3 FB Neck ROM: Full    Dental no notable dental hx.    Pulmonary Current Smoker and Patient abstained from smoking.   Pulmonary exam normal        Cardiovascular hypertension, Pt. on medications and Pt. on home beta blockers + CAD and +CHF  Normal cardiovascular exam  TTE 09/2020: EF 35-40%, global hypokinesis, moderate to severe LVE, mild LAE, mild MR    Neuro/Psych  Headaches    GI/Hepatic negative GI ROS, Neg liver ROS,,,  Endo/Other  negative endocrine ROS    Renal/GU negative Renal ROS   hematuria    Musculoskeletal  (+) Arthritis ,    Abdominal   Peds  Hematology  (+) Blood dyscrasia (Hgb 10.1), anemia   Anesthesia Other Findings Day of surgery medications reviewed with patient.  Reproductive/Obstetrics                             Anesthesia Physical Anesthesia Plan  ASA: 3  Anesthesia Plan: General   Post-op Pain Management: Tylenol PO (pre-op)*   Induction: Intravenous  PONV Risk Score and Plan: 1 and Treatment may vary due to age or medical condition, Midazolam, Dexamethasone and Ondansetron  Airway Management Planned: LMA  Additional Equipment: None  Intra-op Plan:   Post-operative Plan: Extubation in OR  Informed Consent: I have reviewed the patients History and Physical, chart, labs and discussed the procedure including the risks, benefits and alternatives for the proposed anesthesia with the patient or authorized representative who has indicated his/her understanding and acceptance.     Dental advisory  given  Plan Discussed with: CRNA  Anesthesia Plan Comments:        Anesthesia Quick Evaluation

## 2022-01-20 NOTE — Anesthesia Postprocedure Evaluation (Signed)
Anesthesia Post Note  Patient: Devon Silva  Procedure(s) Performed: CYSTOSCOPY WITH FULGERATION clot evacuation     Patient location during evaluation: PACU Anesthesia Type: General Level of consciousness: awake and alert Pain management: pain level controlled Vital Signs Assessment: post-procedure vital signs reviewed and stable Respiratory status: spontaneous breathing, nonlabored ventilation and respiratory function stable Cardiovascular status: blood pressure returned to baseline Postop Assessment: no apparent nausea or vomiting Anesthetic complications: no   No notable events documented.  Last Vitals:  Vitals:   01/20/22 1948 01/20/22 2019  BP: 127/77 (!) 135/90  Pulse: 66 68  Resp: 13 18  Temp: 37 C 36.7 C  SpO2: 99% 95%    Last Pain:  Vitals:   01/20/22 2019  TempSrc: Oral  PainSc: 0-No pain                 Shanda Howells

## 2022-01-20 NOTE — Progress Notes (Signed)
   01/20/22 0210  Urethral Catheter M. Gay MD Latex;Triple-lumen 22 Fr.  Placement Date/Time: 01/17/22 0855   Person Inserting LDA: Burman Foster MD  Patient Location at Time of Insertion [facility], Department, Room Number): [facility] OR 11  Catheter placed prior to periop?: No  Inserted prior to hospital arrival?: No  Catheter Type: Late...  Input (mL) 100 mL  Output (mL) 110 mL  Urine Characteristics  Urine Color Red  Urine Appearance Blood clots;Bloody

## 2022-01-20 NOTE — Anesthesia Procedure Notes (Signed)
Procedure Name: LMA Insertion Date/Time: 01/20/2022 5:00 PM  Performed by: Minerva Ends, CRNAPre-anesthesia Checklist: Patient identified, Emergency Drugs available, Suction available and Patient being monitored Patient Re-evaluated:Patient Re-evaluated prior to induction Oxygen Delivery Method: Circle System Utilized Preoxygenation: Pre-oxygenation with 100% oxygen Induction Type: IV induction Ventilation: Mask ventilation without difficulty LMA: LMA inserted and LMA with gastric port inserted LMA Size: 4.0 Number of attempts: 1 Placement Confirmation: positive ETCO2 Tube secured with: Tape Dental Injury: Teeth and Oropharynx as per pre-operative assessment  Comments: IV induction Houze--LMA insertion Howze-- pt with loose left front tooth and chipped right front-- unchanged with LMA insertion-- bilat BS

## 2022-01-20 NOTE — Progress Notes (Signed)
3 Days Post-Op Subjective: Denies pain. No nausea or emesis. Required irrigation for clots overnight 3 times. Irrigated this morning with difficulty. Urine pink to light red. Hgb continues to drop.  Objective: Vital signs in last 24 hours: Temp:  [98.1 F (36.7 C)-98.6 F (37 C)] 98.6 F (37 C) (12/11 0404) Pulse Rate:  [69-79] 69 (12/11 0404) Resp:  [18-20] 20 (12/11 0404) BP: (103-119)/(64-84) 108/64 (12/11 0404) SpO2:  [95 %-98 %] 95 % (12/11 0404) FiO2 (%):  [21 %] 21 % (12/11 0404) Weight:  [99 kg] 99 kg (12/10 2110)  Intake/Output from previous day: 12/10 0701 - 12/11 0700 In: 97948 [P.O.:600] Out: 47945 [Urine:47945] Intake/Output this shift: Total I/O In: 3000 [Other:3000] Out: 950 [Urine:950]  Physical Exam:  General: Alert and oriented CV: RRR Lungs: Clear Abdomen: Soft, ND, NT Ext: NT, No erythema  Lab Results: Recent Labs    01/18/22 0430 01/19/22 0407 01/20/22 0347  HGB 12.8* 11.0* 10.1*  HCT 37.2* 31.8* 28.3*   BMET Recent Labs    01/17/22 1057 01/18/22 0430  NA 139 138  K 4.3 4.3  CL 109 108  CO2 24 22  GLUCOSE 104* 121*  BUN 17 16  CREATININE 0.98 0.99  CALCIUM 9.0 9.0     Studies/Results: No results found.  Assessment/Plan: POD3 s/p TURP  -Irrigating with difficulty with moderate amount of clots. Still requiring CBI. Hgb declined to 10.1. -Will add on to OR for cystoscopy, clot evacuation, fulguration of prostate    LOS: 1 day   Matt R. Corinn Stoltzfus MD 01/20/2022, 7:29 AM [facility]  Pager: [phone]

## 2022-01-21 LAB — HEMOGLOBIN AND HEMATOCRIT, BLOOD
HCT: 29.4 % — ABNORMAL LOW (ref 39.0–52.0)
Hemoglobin: 10.1 g/dL — ABNORMAL LOW (ref 13.0–17.0)

## 2022-01-21 NOTE — Discharge Summary (Signed)
Date of admission: 01/17/2022  Date of discharge: 01/21/2022  Admission diagnosis: BPH  Discharge diagnosis: BPH  Secondary diagnoses: None  History and Physical: For full details, please see admission history and physical. Briefly, Devon Silva is a 61 y.o. year old patient with BPH who underwent TURP.   Hospital Course: The patient recovered in the usual expected fashion.  He had his diet advanced slowly.  Initially managed with IV pain control, then transitioned to PO meds when he was tolerating oral intake.  He had persistent hematuria and clots with downtrending hgb. He was taken to the OR for cystoscopy with clot evacuation fulguration of prostate.  His urine remained cleared.  He was appropriate for discharge home. He was discharged to home on POD#4.  At the time of discharge the patient was tolerating a regular diet, passing flatus, ambulating, had adequate pain control and was agreeable to discharge.  Follow up as scheduled.    Laboratory values:  Recent Labs    01/20/22 0347 01/20/22 2035 01/21/22 0403  HGB 10.1* 11.0* 10.1*  HCT 28.3* 31.6* 29.4*   No results for input(s): "CREATININE" in the last 72 hours.  Disposition: Home  Discharge instruction: The patient was instructed to be ambulatory but told to refrain from heavy lifting, strenuous activity, or driving.   Discharge medications:  Allergies as of 01/21/2022   No Known Allergies      Medication List     TAKE these medications    acetaminophen 500 MG tablet Commonly known as: TYLENOL Take 500 mg by mouth every 6 (six) hours as needed for moderate pain.   aspirin EC 81 MG tablet Take 81 mg by mouth daily. Swallow whole.   atorvastatin 80 MG tablet Commonly known as: LIPITOR Take 1 tablet (80 mg total) by mouth at bedtime.   carvedilol 6.25 MG tablet Commonly known as: COREG TAKE 1.5 TABLETS BY MOUTH TWICE DAILY WITH MEALS.   docusate sodium 100 MG capsule Commonly known as: Colace Take 1  capsule (100 mg total) by mouth daily as needed for up to 30 doses.   Entresto 97-103 MG Generic drug: sacubitril-valsartan TAKE (1) TABLET BY MOUTH TWICE DAILY.   furosemide 40 MG tablet Commonly known as: LASIX Take 1 tablet (40 mg total) by mouth 2 (two) times daily for 3 days, THEN 1.5 tablets (60 mg total) daily. Start taking on: July 23, 2021 What changed: See the new instructions.   neomycin-bacitracin-polymyxin Oint Commonly known as: NEOSPORIN Apply 1 Application topically as needed for wound care.   Oxycodone HCl 10 MG Tabs Take 10 mg by mouth 4 (four) times daily.   oxyCODONE-acetaminophen 5-325 MG tablet Commonly known as: Percocet Take 1 tablet by mouth every 4 (four) hours as needed for up to 18 doses for severe pain.   potassium chloride SA 20 MEQ tablet Commonly known as: KLOR-CON M Take 2 tablets (40 mEq total) by mouth daily for 3 days, THEN 1 tablet (20 mEq total) daily. Start taking on: July 23, 2021 What changed: See the new instructions.   spironolactone 25 MG tablet Commonly known as: ALDACTONE TAKE ONE TABLET BY MOUTH ONCE DAILY.   tamsulosin 0.4 MG Caps capsule Commonly known as: Flomax Take 1 capsule (0.4 mg total) by mouth daily.   VISINE OP Place 1 drop into both eyes daily as needed (redness).        Followup:   Follow-up Information     [facility] Follow up on 01/20/2022.   Why: 8:30AM  Contact information: [address] [place] [address] [place]. [facility]  Pager: [phone]

## 2022-01-21 NOTE — Progress Notes (Signed)
1 Day Post-Op Subjective: Denies pain. Tolerating catheter. Urine clear yellow with CBI clamped.  Objective: Vital signs in last 24 hours: Temp:  [97.8 F (36.6 C)-98.8 F (37.1 C)] 98.7 F (37.1 C) (12/12 0418) Pulse Rate:  [62-99] 62 (12/12 0418) Resp:  [11-20] 18 (12/12 0418) BP: (105-164)/(59-123) 118/59 (12/12 0418) SpO2:  [91 %-100 %] 94 % (12/12 0418)  Intake/Output from previous day: 12/11 0701 - 12/12 0700 In: 85631 [P.O.:1200; I.V.:620; IV Piggyback:100] Out: 49702 [OVZCH:88502; Blood:20] Intake/Output this shift: Total I/O In: 360 [P.O.:360] Out: 950 [Urine:950]  Physical Exam:  General: Alert and oriented CV: RRR Lungs: Clear Abdomen: Soft, ND, NT Ext: NT, No erythema  Lab Results: Recent Labs    01/20/22 0347 01/20/22 2035 01/21/22 0403  HGB 10.1* 11.0* 10.1*  HCT 28.3* 31.6* 29.4*   BMET No results for input(s): "NA", "K", "CL", "CO2", "GLUCOSE", "BUN", "CREATININE", "CALCIUM" in the last 72 hours.   Studies/Results: No results found.  Assessment/Plan: S/p TURP with hematuria and clots requiring clot evacuation on 01/20/2022  -Bleeding has stopped. Urine clear yellow with CBI clamped. Hgb stable. Ok to discharge with foley in place. Will arrange f/u this Thursday for void trial in office.   LOS: 2 days   Matt R. Bohdi Leeds MD 01/21/2022, 8:42 AM [facility]  Pager: [phone]

## 2022-01-24 ENCOUNTER — Encounter (HOSPITAL_COMMUNITY): Payer: Self-pay | Admitting: Urology

## 2022-01-28 IMAGING — CR DG CHEST 2V
2 series · 2 of 2 positions shown · non-contrast
Comparison: Chest radiograph March 22, 2020

CLINICAL DATA: Shortness of breath x1 day

EXAM:
CHEST - 2 VIEW

[chest pa]
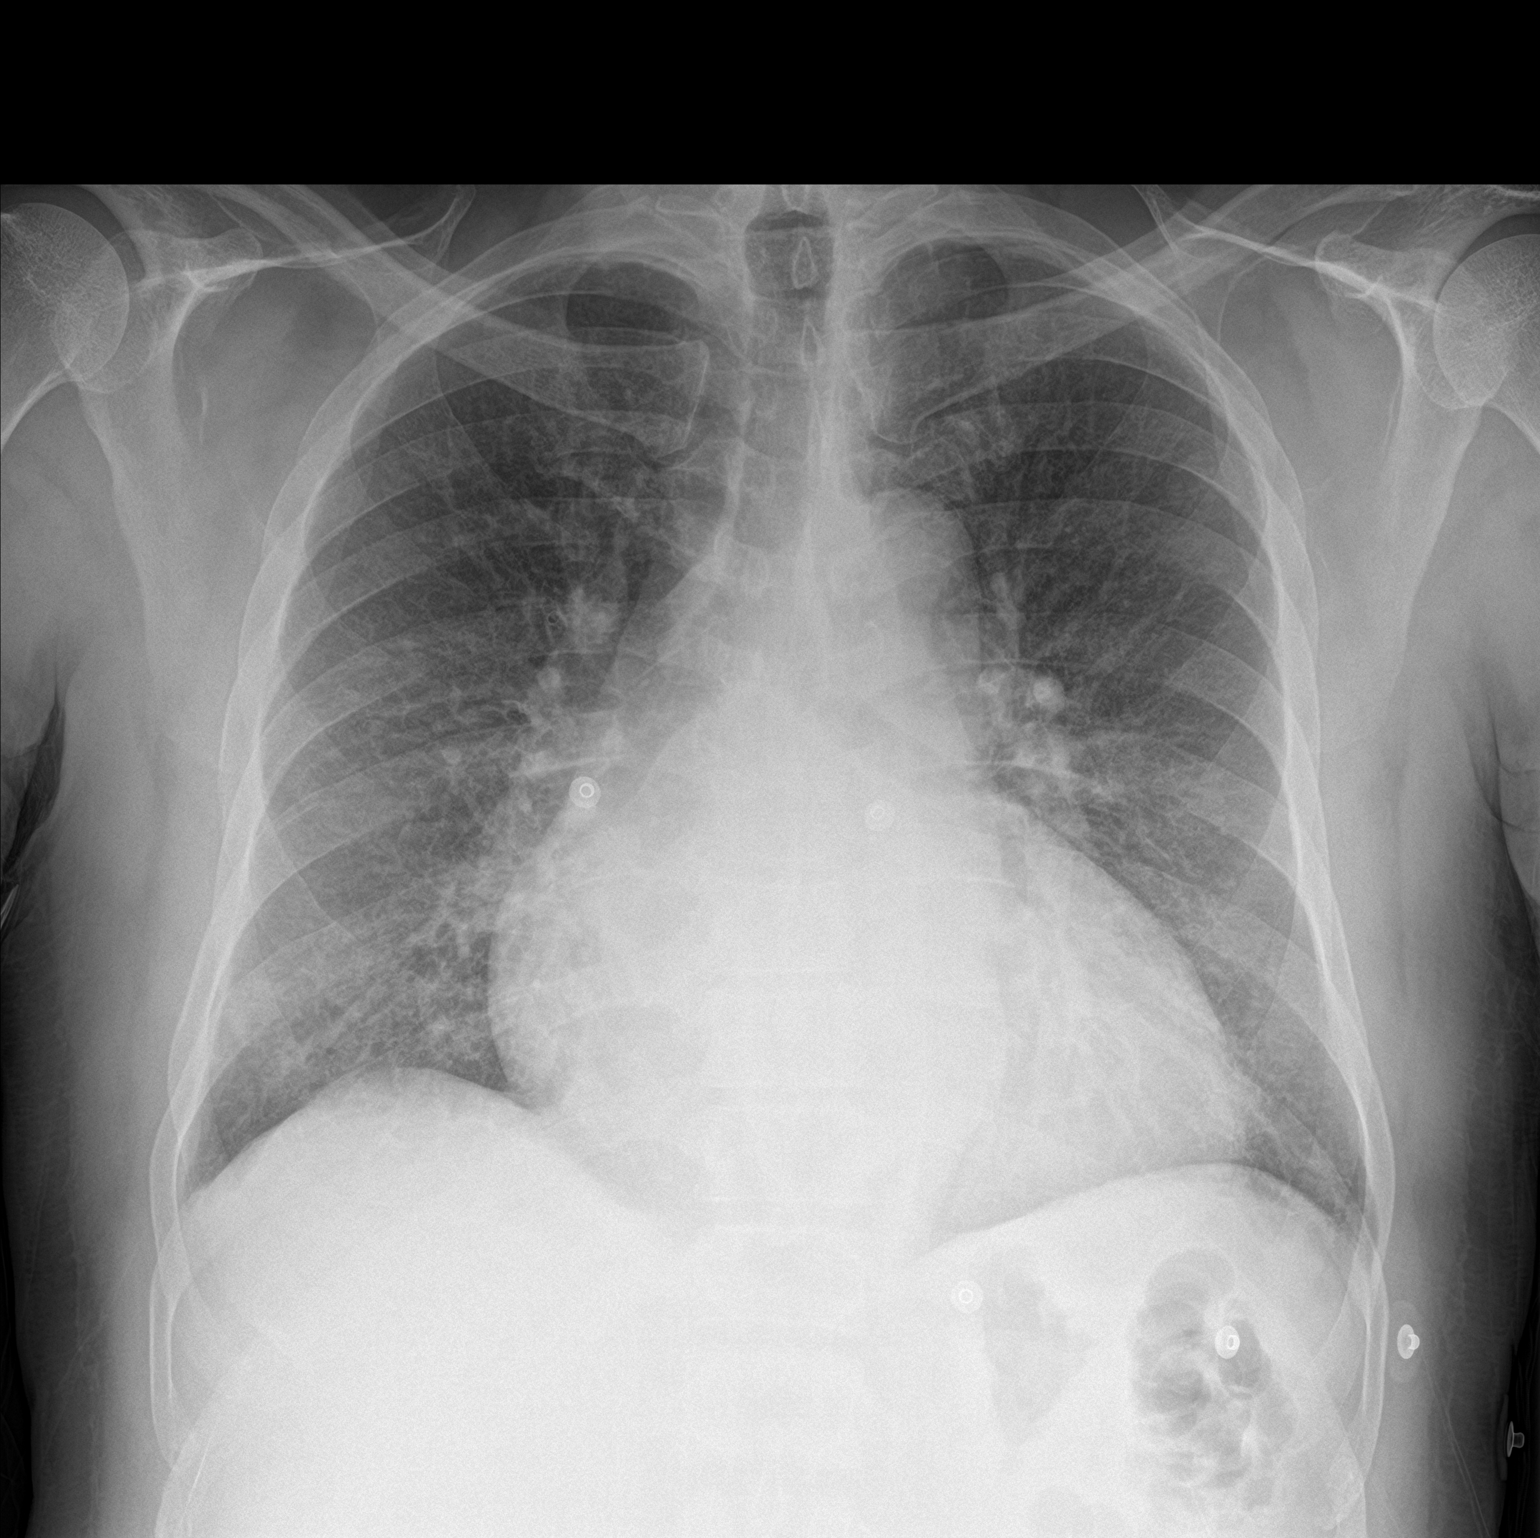

[chest lat]
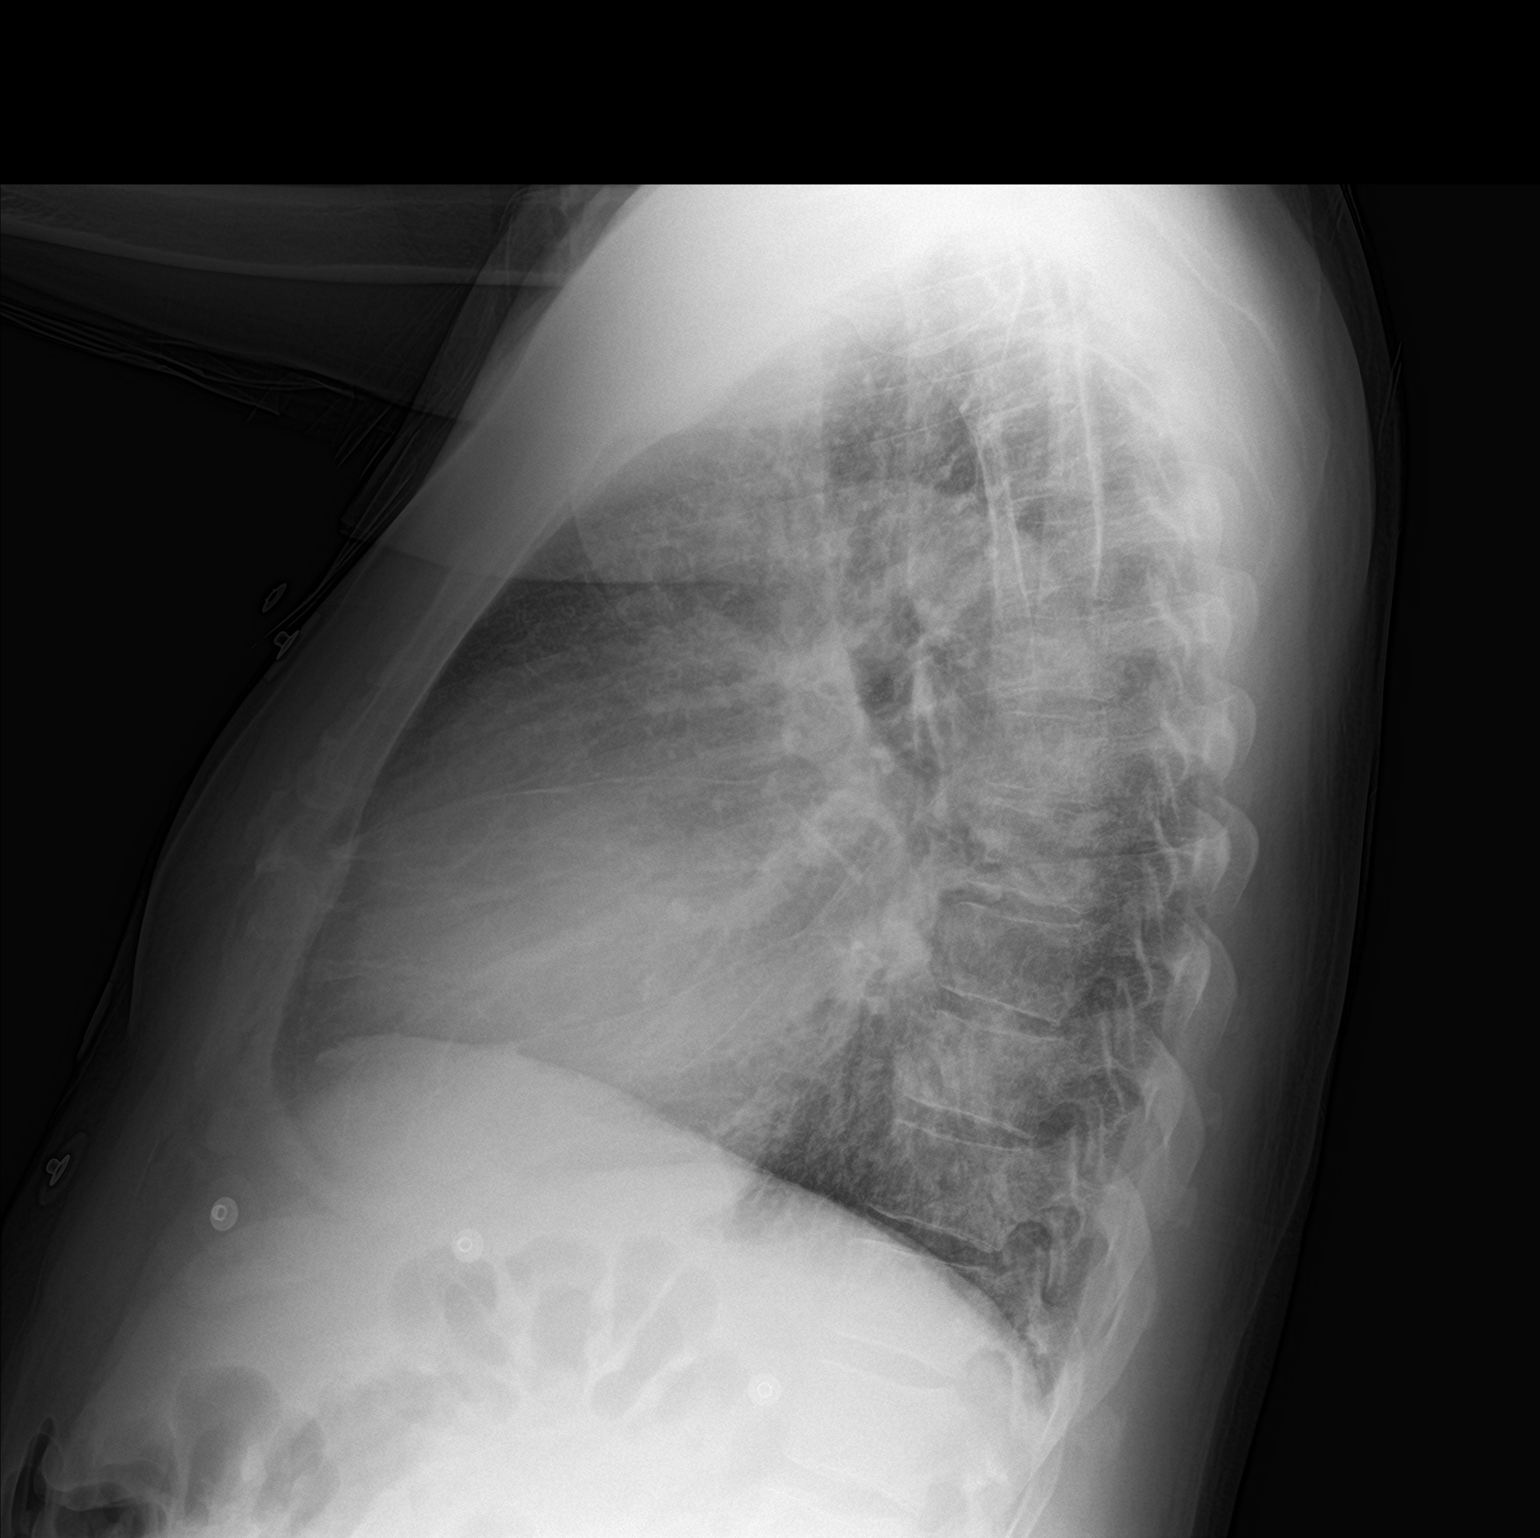

[2 of 2 positions shown; findings below may reference images not displayed]

FINDINGS: Cardiomegaly, similar prior. Central vascular congestion. Similar
hazy bilateral interstitial and airspace opacities. No pleural
effusion. No pneumothorax. The visualized skeletal structures are
unremarkable.
IMPRESSION: Stable cardiomegaly and central vascular congestion. Similar hazy
bilateral interstitial and airspace opacities most consistent with
pulmonary edema.

## 2022-01-30 ENCOUNTER — Encounter (HOSPITAL_COMMUNITY): Payer: Self-pay | Admitting: Internal Medicine

## 2022-01-30 ENCOUNTER — Ambulatory Visit (HOSPITAL_COMMUNITY)
Admission: RE | Admit: 2022-01-30 | Discharge: 2022-01-30 | Disposition: A | Discharge status: Home / Self Care | Payer: Medicaid Other | Source: Ambulatory Visit | Attending: Internal Medicine | Admitting: Internal Medicine

## 2022-01-30 VITALS — BP 104/66 | HR 78 | Wt 223.8 lb

## 2022-01-30 DIAGNOSIS — I428 Other cardiomyopathies: Secondary | ICD-10-CM | POA: Insufficient documentation

## 2022-01-30 DIAGNOSIS — F1910 Other psychoactive substance abuse, uncomplicated: Secondary | ICD-10-CM | POA: Diagnosis not present

## 2022-01-30 DIAGNOSIS — I110 Hypertensive heart disease with heart failure: Secondary | ICD-10-CM | POA: Diagnosis not present

## 2022-01-30 DIAGNOSIS — G8929 Other chronic pain: Secondary | ICD-10-CM | POA: Insufficient documentation

## 2022-01-30 DIAGNOSIS — I5042 Chronic combined systolic (congestive) and diastolic (congestive) heart failure: Secondary | ICD-10-CM | POA: Insufficient documentation

## 2022-01-30 DIAGNOSIS — E669 Obesity, unspecified: Secondary | ICD-10-CM | POA: Diagnosis not present

## 2022-01-30 DIAGNOSIS — I2510 Atherosclerotic heart disease of native coronary artery without angina pectoris: Secondary | ICD-10-CM

## 2022-01-30 DIAGNOSIS — Z79899 Other long term (current) drug therapy: Secondary | ICD-10-CM | POA: Diagnosis not present

## 2022-01-30 DIAGNOSIS — F17210 Nicotine dependence, cigarettes, uncomplicated: Secondary | ICD-10-CM | POA: Diagnosis not present

## 2022-01-30 DIAGNOSIS — Z8249 Family history of ischemic heart disease and other diseases of the circulatory system: Secondary | ICD-10-CM | POA: Insufficient documentation

## 2022-01-30 DIAGNOSIS — Z5986 Financial insecurity: Secondary | ICD-10-CM | POA: Insufficient documentation

## 2022-01-30 DIAGNOSIS — I5022 Chronic systolic (congestive) heart failure: Secondary | ICD-10-CM | POA: Diagnosis not present

## 2022-01-30 DIAGNOSIS — E119 Type 2 diabetes mellitus without complications: Secondary | ICD-10-CM | POA: Diagnosis not present

## 2022-01-30 DIAGNOSIS — Z8616 Personal history of COVID-19: Secondary | ICD-10-CM | POA: Diagnosis not present

## 2022-01-30 DIAGNOSIS — Z6832 Body mass index (BMI) 32.0-32.9, adult: Secondary | ICD-10-CM | POA: Diagnosis not present

## 2022-01-30 DIAGNOSIS — I10 Essential (primary) hypertension: Secondary | ICD-10-CM

## 2022-01-30 MED ORDER — ENTRESTO 49-51 MG PO TABS: 1.0000 | ORAL_TABLET | Freq: Two times a day (BID) | ORAL | 11 refills | Status: DC

## 2022-01-30 NOTE — Progress Notes (Signed)
Advanced Heart Failure Clinic Note    PCP: Benetta Spar, MD Primary Cardiologist: Dr. Shari Prows HF MD: Dr Gala Romney  HPI: Devon Silva is a 61 y.o. 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    Diagnosed with systolic HF in [place] in 2018 in setting of severe HTN,  LHC revealed: Moderate 40% stenosis in the mid left circumflex; mild diffuse disease in the left and right coronary system   Moved to [place] in 2019, received most of his care at [facility] free clinic. Admitted 1/22 with COVID PNA.   Admitted 2/22 with HF. Echo 45-50% with global hypokinesis and grade 2 diastolic dysfunction, severe RV failure and dilation with severe pulmonary hypertension felt to possibly be secondary to left sided heart disease or longstanding tobacco abuse. Chest CT no PE.   Followed up in HF [facility] clinic and scheduled for TEE and R/L cath on 04/18/20 (see below).   Cath showed non-obs CAD, severe NICM with low output and moderately elevated filling pressures. CPX showed moderate limitation due to HF and body habitus.  Echo (8/22): EF 40-45%. Mild MR/TR  Follow up 3/23, started back smoking and drinking occasionally. NYHA II-III, volume up, REDs 43%. Lasix increased to 40 daily.  Seen in ED 4/23 with CP and SOB, out off all meds, UDS + cocaine. Meds restarted.  Today he returns for HF follow up. Says he is feeling better. Breathing better. Able to walk around the block without problem. Tired at times. Occasional edema and will take extra lasix as needed. Compliant with meds. Denies ETOH and cocaine. Says BP often in 90s.     Cardiac Studies: - Echo (8/22): EF 40-45%, mild MR/TR  - TEE (3/22): LVEF 30-35% RV mod HK. Severe biatrial enlargement. 4+ MR and 4+ TR due to annular dilation   - Cath (3/22): LM 20%, LAD 20% Ost LCX 60%. RCA 40%  Ao = 93/68 (82) LV = 88/15 RA = 14 RV = 58/12 PA = 50/21  (35) PCW = 26 (no prominent v waves) Fick cardiac output/index = 3.7/1.8 PVR = 2.4 SVR = 1458 FA sat = 96% PA sat = 48%, 49%  - CPX 06/21/20 FVC 3.39 (82%)      FEV1 2.75 (85%)       Resting HR: 89 Peak HR: 162   (100% age predicted max HR)  BP rest: 116/20 BP peak: 196/90  Peak VO2: 16.5 (51% predicted peak VO2; corrected to ibw = 19.4 ml/kg/min) )  VE/VCO2 slope:  37  OUES: 1.56  Peak RER: 1.19  Ventilatory Threshold: 11.6 (36% predicted or measured peak VO2)  VE/MVV:  53%  PETCO2 at peak:  30  O2pulse:  11   (73% predicted O2pulse)   - Echo (2/22): EF 45-50%  ROS: All systems negative except as listed in HPI, PMH and Problem List.  SH:  Social History   Socioeconomic History   Marital status: Married    Spouse name: Not on file   Number of children: Not on file   Years of education: Not on file   Highest education level: Not on file  Occupational History   Not on file  Tobacco Use   Smoking status: Some Days    Packs/day: 0.25    Years: 20.00    Total pack years: 5.00    Types: Cigarettes    Last attempt to quit: 10/12/2021    Years since quitting: 0.3  Smokeless tobacco: Never   Tobacco comments:    1 cig/ daily  Vaping Use   Vaping Use: Never used  Substance and Sexual Activity   Alcohol use: Not Currently    Alcohol/week: 2.0 standard drinks of alcohol    Types: 1 Cans of beer, 1 Shots of liquor per week    Comment: stopped - over a month   Drug use: Not Currently   Sexual activity: Not on file  Other Topics Concern   Not on file  Social History Narrative   Not on file   Social Determinants of Health   Financial Resource Strain: High Risk (04/06/2020)   Overall Financial Resource Strain (CARDIA)    Difficulty of Paying Living Expenses: Very hard  Food Insecurity: No Food Insecurity (01/20/2022)   Hunger Vital Sign    Worried About Running Out of Food in the Last Year: Never true    Ran Out of Food in the Last Year: Never true  Transportation  Needs: No Transportation Needs (01/20/2022)   PRAPARE - Hydrologist (Medical): No    Lack of Transportation (Non-Medical): No  Physical Activity: Not on file  Stress: Not on file  Social Connections: Not on file  Intimate Partner Violence: Not At Risk (01/20/2022)   Humiliation, Afraid, Rape, and Kick questionnaire    Fear of Current or Ex-Partner: No    Emotionally Abused: No    Physically Abused: No    Sexually Abused: No   FH:  Family History  Problem Relation Age of Onset   Cancer Father    Heart disease Father    Prostate cancer Brother    Cancer Brother    Cancer - Colon Neg Hx    Colon polyps Neg Hx    Past Medical History:  Diagnosis Date   Arthritis    CHF (congestive heart failure) (HCC)    Chronic back pain    Coronary artery disease    Headache    Heart valve problem    Hypertension    Pre-diabetes    Tuberculosis 1991   per pt   Current Outpatient Medications  Medication Sig Dispense Refill   acetaminophen (TYLENOL) 500 MG tablet Take 500 mg by mouth every 6 (six) hours as needed for moderate pain.     aspirin EC 81 MG tablet Take 81 mg by mouth daily. Swallow whole.     atorvastatin (LIPITOR) 80 MG tablet Take 1 tablet (80 mg total) by mouth at bedtime. 30 tablet 0   carvedilol (COREG) 6.25 MG tablet TAKE 1.5 TABLETS BY MOUTH TWICE DAILY WITH MEALS. 135 tablet 0   docusate sodium (COLACE) 100 MG capsule Take 1 capsule (100 mg total) by mouth daily as needed for up to 30 doses. 30 capsule 0   ENTRESTO 97-103 MG TAKE (1) TABLET BY MOUTH TWICE DAILY. 180 tablet 0   furosemide (LASIX) 40 MG tablet Take 60 mg by mouth daily.     neomycin-bacitracin-polymyxin (NEOSPORIN) OINT Apply 1 Application topically as needed for wound care.     Oxycodone HCl 10 MG TABS Take 10 mg by mouth 4 (four) times daily.     oxyCODONE-acetaminophen (PERCOCET) 5-325 MG tablet Take 1 tablet by mouth every 4 (four) hours as needed for up to 18 doses for  severe pain. 18 tablet 0   potassium chloride (KLOR-CON) 20 MEQ packet Take 20 mEq by mouth daily.     spironolactone (ALDACTONE) 25 MG tablet TAKE ONE TABLET BY  MOUTH ONCE DAILY. 90 tablet 3   tamsulosin (FLOMAX) 0.4 MG CAPS capsule Take 1 capsule (0.4 mg total) by mouth daily. 10 capsule 0   Tetrahydrozoline HCl (VISINE OP) Place 1 drop into both eyes daily as needed (redness).     No current facility-administered medications for this encounter.   BP 104/66   Pulse 78   Wt 101.5 kg (223 lb 12.8 oz)   SpO2 98%   BMI 32.11 kg/m   Wt Readings from Last 3 Encounters:  01/30/22 101.5 kg (223 lb 12.8 oz)  01/19/22 99 kg (218 lb 4.1 oz)  01/09/22 99.8 kg (220 lb)   PHYSICAL EXAM: General:  Well appearing. No resp difficulty HEENT: normal Neck: supple. no JVD. Carotids 2+ bilat; no bruits. No lymphadenopathy or thryomegaly appreciated. Cor: PMI nondisplaced. Regular rate & rhythm. No rubs, gallops or murmurs. Lungs: clear Abdomen: soft, nontender, nondistended. No hepatosplenomegaly. No bruits or masses. Good bowel sounds. Extremities: no cyanosis, clubbing, rash, edema Neuro: alert & orientedx3, cranial nerves grossly intact. moves all 4 extremities w/o difficulty. Affect pleasant   ASSESSMENT & PLAN: 1. Chronic systolic HF - due to NICM (suspect HTN). - diagnosed in [place] in 2018 - TEE 04/18/20: LVEF 30-35. RV mod HK. Severe biatrial enlargement. 4+ MR and 4+ TR due to annular dilation  - Cath 04/18/20: LM 20%, LAD 20% Ost LCX 60%. 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 Mild MR/TR - Improved NYHA II. Volume ok  - Continue lasix 60 daily - BP on low end. Decrease Entresto 97/103 mg bid -> 49/51 bid - Continue carvedilol 9.375 mg bid.  - Continue spiro  25 mg daily. - Continue Farxiga 10 mg daily. - Says he had recent labs and "everything ok" - Due for repeat echo  2. CAD - Non-obstructive by cath 3/22. - No s/s ischemia - Continue ASA/statin.  3. HTN - Much improved. - GDMT as above.    4. Tobacco/cocaine use - Says he is now quit because he is on chronic pain management program    Glori Bickers, MD  3:09 PM

## 2022-01-30 NOTE — Addendum Note (Signed)
Encounter addended by: Chinita Pester, CMA on: 01/30/2022 3:13 PM  Actions taken: Care Plan modified, Order list changed, Diagnosis association updated, Clinical Note Signed

## 2022-01-30 NOTE — Patient Instructions (Signed)
No Labs done today.   DECREASE Entresto 49-51mg  (1 tablet) by mouth 2 times daily.   No other medication changes were made. Please continue all current medications as prescribed.  Your physician recommends that you schedule a follow-up appointment in: 4 months with an echo prior to your appointment.   Your physician has requested that you have an echocardiogram. Echocardiography is a painless test that uses sound waves to create images of your heart. It provides your doctor with information about the size and shape of your heart and how well your heart's chambers and valves are working. This procedure takes approximately one hour. There are no restrictions for this procedure. Please do NOT wear cologne, perfume, aftershave, or lotions (deodorant is allowed). Please arrive 15 minutes prior to your appointment time.  If you have any questions or concerns before your next appointment please send [place] a message through [facility] or call our office at [phone].    TO LEAVE A MESSAGE FOR THE NURSE SELECT OPTION 2, PLEASE LEAVE A MESSAGE INCLUDING: YOUR NAME DATE OF BIRTH CALL BACK NUMBER REASON FOR CALL**this is important as we prioritize the call backs  YOU WILL RECEIVE A CALL BACK THE SAME DAY AS LONG AS YOU CALL BEFORE 4:00 PM   Do the following things EVERYDAY: Weigh yourself in the morning before breakfast. Write it down and keep it in a log. Take your medicines as prescribed Eat low salt foods--Limit salt (sodium) to 2000 mg per day.  Stay as active as you can everyday Limit all fluids for the day to less than 2 liters   At the [facility], you and your health needs are our priority. As part of our continuing mission to provide you with exceptional heart care, we have created designated Provider Care Teams. These Care Teams include your primary Cardiologist (physician) and Advanced Practice Providers (APPs- Physician Assistants and Nurse Practitioners) who all work  together to provide you with the care you need, when you need it.   You may see any of the following providers on your designated Care Team at your next follow up: Dr Arvilla Meres Dr Carron Curie, NP Robbie Lis, [place] Karle Plumber, PharmD   Please be sure to bring in all your medications bottles to every appointment.

## 2022-02-21 NOTE — Progress Notes (Deleted)
GI Office Note    Referring Provider: Carrolyn Silva* Primary Care Physician:  Devon Meiers, Devon Silva Primary Gastroenterologist: Devon Alas. Abbey Chatters, Devon Silva  Date:  02/21/2022  ID:  Devon Silva, DOB 1960-10-10, MRN [id number]   Chief Complaint   No chief complaint on file.   History of Present Illness  Devon Silva is a 62 y.o. 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  Initial office visit 10/22/2021 for evaluation prior to scheduling screening colonoscopy given extensive cardiac history.  Denied abdominal pain, cavitate, intentional weight loss, diarrhea, rectal pain.  Did report occasional early satiety as well as drug-induced constipation.  States he did not look at his stools therefore no evidence of melena or hematochezia.  States usually has a bowel movement every other day but unaware of consistency.  Mild lower abdominal tenderness on exam and did report frequent bloating.  MiraLAX 1 capful daily and dietary increase of fiber recommended.  Scheduled for TCS.  Colonoscopy 11/22/2021: -Nonbleeding internal hemorrhoids - Diverticulosis in the sigmoid and descending colon - 4 mm polyp in descending colon (hyperplastic polyp) - Repeat TCS in 10 years   Today: Constipation:  Current Outpatient Medications  Medication Sig Dispense Refill   acetaminophen (TYLENOL) 500 MG tablet Take 500 mg by mouth every 6 (six) hours as needed for moderate pain.     aspirin EC 81 MG tablet Take 81 mg by mouth daily. Swallow whole.     atorvastatin (LIPITOR) 80 MG tablet Take 1 tablet (80 mg total) by mouth at bedtime. 30 tablet 0   carvedilol (COREG) 6.25 MG tablet TAKE 1.5 TABLETS BY MOUTH TWICE DAILY WITH MEALS. 135 tablet 0   docusate sodium (COLACE) 100 MG capsule Take 1 capsule (100 mg total) by mouth daily as  needed for up to 30 doses. 30 capsule 0   furosemide (LASIX) 40 MG tablet Take 60 mg by mouth daily.     neomycin-bacitracin-polymyxin (NEOSPORIN) OINT Apply 1 Application topically as needed for wound care.     Oxycodone HCl 10 MG TABS Take 10 mg by mouth 4 (four) times daily.     oxyCODONE-acetaminophen (PERCOCET) 5-325 MG tablet Take 1 tablet by mouth every 4 (four) hours as needed for up to 18 doses for severe pain. 18 tablet 0   potassium chloride (KLOR-CON) 20 MEQ packet Take 20 mEq by mouth daily.     sacubitril-valsartan (ENTRESTO) 49-51 MG Take 1 tablet by mouth 2 (two) times daily. 60 tablet 11   spironolactone (ALDACTONE) 25 MG tablet TAKE ONE TABLET BY MOUTH ONCE DAILY. 90 tablet 3   tamsulosin (FLOMAX) 0.4 MG CAPS capsule Take 1 capsule (0.4 mg total) by mouth daily. 10 capsule 0   Tetrahydrozoline HCl (VISINE OP) Place 1 drop into both eyes daily as needed (redness).     No current facility-administered medications for this visit.    Past Medical History:  Diagnosis Date   Arthritis    CHF (congestive heart failure) (HCC)    Chronic back pain    Coronary artery disease    Headache    Heart valve problem    Hypertension    Pre-diabetes    Tuberculosis 1991   per pt    Past Surgical History:  Procedure Laterality Date   COLONOSCOPY WITH PROPOFOL N/A 11/22/2021   Procedure: COLONOSCOPY WITH PROPOFOL;  Surgeon: Devon Harman, Devon Silva;  Location: [facility];  Service: Endoscopy;  Laterality: N/A;  8:30am, asa 3   CYSTOSCOPY WITH FULGERATION N/A 01/20/2022   Procedure: CYSTOSCOPY WITH FULGERATION clot evacuation;  Surgeon: Devon Lima, Devon Silva;  Location: [facility];  Service: Urology;  Laterality: N/A;   POLYPECTOMY  11/22/2021   Procedure: POLYPECTOMY;  Surgeon: Devon Harman, Devon Silva;  Location: [facility];  Service: Endoscopy;;   RIGHT/LEFT HEART CATH AND CORONARY ANGIOGRAPHY N/A 04/18/2020   Procedure: RIGHT/LEFT HEART CATH AND CORONARY ANGIOGRAPHY;  Surgeon: Devon Artist, Devon Silva;  Location: [facility];  Service: Cardiovascular;  Laterality: N/A;   TEE WITHOUT CARDIOVERSION N/A 04/18/2020   Procedure: TRANSESOPHAGEAL ECHOCARDIOGRAM (TEE);  Surgeon: Devon Artist, Devon Silva;  Location: [facility] ENDOSCOPY;  Service: Cardiovascular;  Laterality: N/A;   TRANSURETHRAL RESECTION OF PROSTATE N/A 01/17/2022   Procedure: BIPOLAR TRANSURETHRAL RESECTION OF THE PROSTATE (TURP);  Surgeon: Devon Lima, Devon Silva;  Location: [facility];  Service: Urology;  Laterality: N/A;    Family History  Problem Relation Age of Onset   Cancer Father    Heart disease Father    Prostate cancer Brother    Cancer Brother    Cancer - Colon Neg Hx    Colon polyps Neg Hx     Allergies as of 02/24/2022   (No Known Allergies)    Social History   Socioeconomic History   Marital status: Married    Spouse name: Not on file   Number of children: Not on file   Years of education: Not on file   Highest education level: Not on file  Occupational History   Not on file  Tobacco Use   Smoking status: Some Days    Packs/day: 0.25    Years: 20.00    Total pack years: 5.00    Types: Cigarettes    Last attempt to quit: 10/12/2021    Years since quitting: 0.3   Smokeless tobacco: Never   Tobacco comments:    1 cig/ daily  Vaping Use   Vaping Use: Never used  Substance and Sexual Activity   Alcohol use: Not Currently    Alcohol/week: 2.0 standard drinks of alcohol    Types: 1 Cans of beer, 1 Shots of liquor per week    Comment: stopped - over a month   Drug use: Not Currently   Sexual activity: Not on file  Other Topics Concern   Not on file  Social History Narrative   Not on file   Social Determinants of Health   Financial Resource Strain: High Risk (04/06/2020)   Overall Financial Resource Strain (CARDIA)    Difficulty of Paying Living Expenses: Very hard  Food Insecurity: No Food Insecurity (01/20/2022)   Hunger Vital Sign    Worried About Running Out of Food in the Last Year:  Never true    Ran Out of Food in the Last Year: Never true  Transportation Needs: No Transportation Needs (01/20/2022)   PRAPARE - Hydrologist (Medical): No    Lack of Transportation (Non-Medical): No  Physical Activity: Not on file  Stress: Not on file  Social Connections: Not on file     Review of Systems   Gen: Denies fever, chills, anorexia. Denies fatigue, weakness, weight loss.  CV: Denies chest pain, palpitations, syncope, peripheral edema, and claudication. Resp: Denies dyspnea at rest, cough, wheezing, coughing up blood, and pleurisy. GI: See HPI Derm: Denies rash, itching, dry skin Psych: Denies depression, anxiety, memory  loss, confusion. No homicidal or suicidal ideation.  Heme: Denies bruising, bleeding, and enlarged lymph nodes.   Physical Exam   There were no vitals taken for this visit.  General:   Alert and oriented. No distress noted. Pleasant and cooperative.  Head:  Normocephalic and atraumatic. Eyes:  Conjuctiva clear without scleral icterus. Mouth:  Oral mucosa pink and moist. Good dentition. No lesions. Lungs:  Clear to auscultation bilaterally. No wheezes, rales, or rhonchi. No distress.  Heart:  S1, S2 present without murmurs appreciated.  Abdomen:  +BS, soft, non-tender and non-distended. No rebound or guarding. No HSM or masses noted. Rectal: *** Msk:  Symmetrical without gross deformities. Normal posture. Extremities:  Without edema. Neurologic:  Alert and  oriented x4 Psych:  Alert and cooperative. Normal mood and affect.   Assessment  Devon Silva is a 62 y.o. male with a history of *** presenting today with   Colon cancer screening: Recent TCS in October 2023 with single hyperplastic polyp, left-sided diverticulosis, and internal hemorrhoids.  Due for repeat in 10 years  Constipation: Likely secondary to chronic opioid use. ***  PLAN   ***     Venetia Night, MSN, FNP-BC, AGACNP-BC [facility]

## 2022-02-24 ENCOUNTER — Ambulatory Visit: Payer: Medicaid Other | Admitting: Gastroenterology

## 2022-04-30 ENCOUNTER — Emergency Department (HOSPITAL_COMMUNITY)
Admission: EM | Admit: 2022-04-30 | Discharge: 2022-04-30 | Disposition: A | Discharge status: Home / Self Care | Payer: Medicaid Other | Attending: Emergency Medicine | Admitting: Emergency Medicine

## 2022-04-30 ENCOUNTER — Emergency Department (HOSPITAL_COMMUNITY): Payer: Medicaid Other

## 2022-04-30 ENCOUNTER — Encounter (HOSPITAL_COMMUNITY): Payer: Self-pay | Admitting: *Deleted

## 2022-04-30 ENCOUNTER — Other Ambulatory Visit: Payer: Self-pay

## 2022-04-30 DIAGNOSIS — M19021 Primary osteoarthritis, right elbow: Secondary | ICD-10-CM | POA: Insufficient documentation

## 2022-04-30 DIAGNOSIS — Z7982 Long term (current) use of aspirin: Secondary | ICD-10-CM | POA: Insufficient documentation

## 2022-04-30 DIAGNOSIS — I10 Essential (primary) hypertension: Secondary | ICD-10-CM | POA: Insufficient documentation

## 2022-04-30 DIAGNOSIS — Z79899 Other long term (current) drug therapy: Secondary | ICD-10-CM | POA: Insufficient documentation

## 2022-04-30 DIAGNOSIS — F17200 Nicotine dependence, unspecified, uncomplicated: Secondary | ICD-10-CM | POA: Diagnosis not present

## 2022-04-30 DIAGNOSIS — M19041 Primary osteoarthritis, right hand: Secondary | ICD-10-CM | POA: Insufficient documentation

## 2022-04-30 DIAGNOSIS — M79641 Pain in right hand: Secondary | ICD-10-CM | POA: Diagnosis present

## 2022-04-30 MED ORDER — OXYCODONE HCL 5 MG PO TABS
10.0000 mg | ORAL_TABLET | Freq: Once | ORAL | Status: AC
  Administered 2022-04-30: 10 mg via ORAL
  Filled 2022-04-30: qty 2

## 2022-04-30 NOTE — ED Provider Notes (Signed)
[facility] Provider Note   CSN: [id number] Arrival date & time: 04/30/22  1203     History  Chief Complaint  Patient presents with   Elbow Pain    Devon Silva is a 62 y.o. male with past medical history significant for hypertension, hyperlipidemia, tobacco use disorder, chronic back pain, arthritis who presents with concern for right hand pain, right elbow pain with swelling present this morning.  Patient reports that the swelling was not present at time my evaluation and it has gone down already.  Patient is not sure if he slept on it any differently than he normally would.  He denies any pain radiating up to his right shoulder.  He denies any known injury of the right elbow or right wrist.  He denies any history of gout, alcohol abuse.  Patient reports he did not take his home oxycodone today because he did not want to "affect the assessment".  He endorses pain is 8/10.  HPI     Home Medications Prior to Admission medications   Medication Sig Start Date End Date Taking? Authorizing Provider  acetaminophen (TYLENOL) 500 MG tablet Take 500 mg by mouth every 6 (six) hours as needed for moderate pain.    [provider]  aspirin EC 81 MG tablet Take 81 mg by mouth daily. Swallow whole.    [provider]  atorvastatin (LIPITOR) 80 MG tablet Take 1 tablet (80 mg total) by mouth at bedtime. 05/28/21   Venter, Margaux, PA-C  carvedilol (COREG) 6.25 MG tablet TAKE 1.5 TABLETS BY MOUTH TWICE DAILY WITH MEALS. 12/27/21   Bensimhon, Shaune Pascal, MD  docusate sodium (COLACE) 100 MG capsule Take 1 capsule (100 mg total) by mouth daily as needed for up to 30 doses. 01/17/22   Janith Lima, MD  furosemide (LASIX) 40 MG tablet Take 60 mg by mouth daily.    [provider]  neomycin-bacitracin-polymyxin (NEOSPORIN) OINT Apply 1 Application topically as needed for wound care.    [provider]  Oxycodone HCl 10 MG TABS  Take 10 mg by mouth 4 (four) times daily. 04/04/21   [provider]  oxyCODONE-acetaminophen (PERCOCET) 5-325 MG tablet Take 1 tablet by mouth every 4 (four) hours as needed for up to 18 doses for severe pain. 01/17/22   Janith Lima, MD  potassium chloride (KLOR-CON) 20 MEQ packet Take 20 mEq by mouth daily.    [provider]  sacubitril-valsartan (ENTRESTO) 49-51 MG Take 1 tablet by mouth 2 (two) times daily. 01/30/22   Bensimhon, Shaune Pascal, MD  spironolactone (ALDACTONE) 25 MG tablet TAKE ONE TABLET BY MOUTH ONCE DAILY. 10/07/21   Bensimhon, Shaune Pascal, MD  tamsulosin (FLOMAX) 0.4 MG CAPS capsule Take 1 capsule (0.4 mg total) by mouth daily. 09/15/21   Malvin Johns, MD  Tetrahydrozoline HCl (VISINE OP) Place 1 drop into both eyes daily as needed (redness).    [provider]      Allergies    Patient has no known allergies.    Review of Systems   Review of Systems  Musculoskeletal:  Positive for arthralgias.  All other systems reviewed and are negative.   Physical Exam Updated Vital Signs BP (!) 152/97 (BP Location: Left Arm)   Pulse (!) 53   Temp 97.7 F (36.5 C) (Oral)   Resp 16   Ht 5\' 10"  (1.778 m)   Wt 99.8 kg   SpO2 98%   BMI 31.57 kg/m  Physical Exam Vitals and nursing note reviewed.  Constitutional:      General: He is not in acute distress.    Appearance: Normal appearance.  HENT:     Head: Normocephalic and atraumatic.  Eyes:     General:        Right eye: No discharge.        Left eye: No discharge.  Cardiovascular:     Rate and Rhythm: Normal rate and regular rhythm.  Pulmonary:     Effort: Pulmonary effort is normal. No respiratory distress.  Musculoskeletal:        General: No deformity.     Comments: Patient with some tenderness to palpation without step-off, deformity of the right elbow, and right hand from the mid hand up through the knuckles and phalanges.  No significant pain of the right wrist, right forearm.  I do not  appreciate any soft tissue swelling, redness, fluctuance of the elbow or the hand.  Intact strength to flexion, extension of all digits, opposition of the thumb, intact strength to flexion, extension of the elbow.  Skin:    General: Skin is warm and dry.  Neurological:     Mental Status: He is alert and oriented to person, place, and time.  Psychiatric:        Mood and Affect: Mood normal.        Behavior: Behavior normal.     ED Results / Procedures / Treatments   Labs (all labs ordered are listed, but only abnormal results are displayed) Labs Reviewed - No data to display  EKG None  Radiology DG Elbow Complete Right  Result Date: 04/30/2022 CLINICAL DATA:  Pain EXAM: RIGHT ELBOW - COMPLETE 3+ VIEW COMPARISON:  None Available. FINDINGS: No fracture or dislocation is seen. Small bony spurs are noted in proximal ulna. There is no displacement of posterior fat pad there are no focal lytic lesions in the bony structures. IMPRESSION: No fracture or dislocation is seen. There is no significant effusion. Small bony spurs are seen in proximal ulna. Electronically Signed   By: Elmer Picker M.D.   On: 04/30/2022 13:28   DG Hand Complete Right  Result Date: 04/30/2022 CLINICAL DATA:  Pain EXAM: RIGHT HAND - COMPLETE 3+ VIEW COMPARISON:  None Available. FINDINGS: No fracture or dislocation is seen. There are no erosive changes. Small bony spurs are seen in first carpometacarpal joint. Minimal bony spurs are seen in first metacarpophalangeal joint. There are no abnormal soft tissue calcifications. IMPRESSION: No fracture or dislocation is seen. There are no focal lytic lesions. Degenerative changes with minimal bony spurs are seen in first carpometacarpal and first metacarpophalangeal joints. Electronically Signed   By: Elmer Picker M.D.   On: 04/30/2022 13:27    Procedures Procedures    Medications Ordered in ED Medications  oxyCODONE (Oxy IR/ROXICODONE) immediate release tablet  10 mg (10 mg Oral Given 04/30/22 1307)    ED Course/ Medical Decision Making/ A&P                             Medical Decision Making Amount and/or Complexity of Data Reviewed Radiology: ordered.  Risk Prescription drug management.   This patient is a 62 y.o. male  who presents to the ED for concern of right elbow pain, right hand pain without known injury.   Differential diagnoses prior to evaluation: The emergent differential diagnosis includes, but is not limited to,  arthritis, gout, lymphedema, SVC  syndrome, infection, vs acute fracture, dislocation vs other . This is not an exhaustive differential.   Past Medical History / Co-morbidities: chronic back pain, arthritis  Physical Exam: Physical exam performed. The pertinent findings include: Patient with some tenderness to palpation without step-off, deformity of the right elbow, and right hand from the mid hand up through the knuckles and phalanges.  No significant pain of the right wrist, right forearm.  I do not appreciate any soft tissue swelling, redness, fluctuance of the elbow or the hand.  Intact strength to flexion, extension of all digits, opposition of the thumb, intact strength to flexion, extension of the elbow.   Lab Tests/Imaging studies: I personally interpreted labs/imaging and the pertinent results include: I independently interpreted right elbow, right hand radiographs which show I agree with the radiologist interpretation.   Medications: I ordered medication including administered patient's home oxycodone 10 mg, will discharge with encouraged mid to take ibuprofen 600-800 mg every 6 for at least a week to help with arthritis inflammation, encouraged to follow-up with PCP.  I have reviewed the patients home medicines and have made adjustments as needed.   Disposition: After consideration of the diagnostic results and the patients response to treatment, I feel that patient stable for discharge with clinical signs  symptoms consistent with arthritis.   emergency department workup does not suggest an emergent condition requiring admission or immediate intervention beyond what has been performed at this time. The plan is: as above. The patient is safe for discharge and has been instructed to return immediately for worsening symptoms, change in symptoms or any other concerns.  Final Clinical Impression(s) / ED Diagnoses Final diagnoses:  Arthritis of hand, right  Arthritis of elbow, right    Rx / DC Orders ED Discharge Orders     None         Dorien Chihuahua 04/30/22 1420    Dorie Rank, MD 05/01/22 [phone]

## 2022-04-30 NOTE — ED Notes (Signed)
X-ray at bedside

## 2022-04-30 NOTE — Discharge Instructions (Signed)
Use ibuprofen for swelling, pain, I would take 600 to 800 mg of ibuprofen every 6 hours in addition to your normal pain medication, please follow-up with your primary care doctor, and pain doctor if you continue to have significant pain, swelling of the hand, elbow despite treatment.

## 2022-04-30 NOTE — ED Triage Notes (Addendum)
Pt states he woke up with right elbow pain this am; pt states his fingers are swollen  Pt denies any obvious injury and no swelling or bruising to elbow or fingers noted  Pt states he takes oxycodone and it has not been helping; he said he takes it 4 times a day but did not take a dose this am

## 2022-05-13 ENCOUNTER — Telehealth: Payer: Self-pay | Admitting: *Deleted

## 2022-05-30 NOTE — Progress Notes (Signed)
Advanced Heart Failure Clinic Note    PCP: Benetta Spar, MD Primary Cardiologist: Dr. Shari Prows HF MD: Dr Gala Romney  HPI: Devon Silva is a 62 y.o. 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    Diagnosed with systolic HF in [place] in 2018 in setting of severe HTN,  LHC revealed: Moderate 40% stenosis in the mid left circumflex; mild diffuse disease in the left and right coronary system   Moved to [place] in 2019, received most of his care at [facility] free clinic. Admitted 1/22 with COVID PNA.   Admitted 2/22 with HF. Echo 45-50% with global hypokinesis and grade 2 diastolic dysfunction, severe RV failure and dilation with severe pulmonary hypertension felt to possibly be secondary to left sided heart disease or longstanding tobacco abuse. Chest CT no PE.   Followed up in [facility] clinic and scheduled for TEE and R/L cath on 04/18/20 (see below).   Cath showed non-obs CAD, severe NICM with low output and moderately elevated filling pressures. CPX showed moderate limitation due to HF and body habitus.  Echo (8/22): EF 40-45%. Mild MR/TR  Follow up 3/23, started back smoking and drinking occasionally. NYHA II-III, volume up, REDs 43%. Lasix increased to 40 daily.  Seen in ED 4/23 with CP and SOB, out off all meds, UDS + cocaine. Meds restarted.  Today he returns for HF follow up. Says he is feeling better. Breathing better. Able to walk around the block without problem. Tired at times. Occasional edema and will take extra lasix as needed. Compliant with meds. Denies ETOH and cocaine. Says BP often in 90s.     Cardiac Studies: - Echo (8/22): EF 40-45%, mild MR/TR  - TEE (3/22): LVEF 30-35% RV mod HK. Severe biatrial enlargement. 4+ MR and 4+ TR due to annular dilation   - Cath (3/22): LM 20%, LAD 20% Ost LCX 60%. RCA 40%  Ao = 93/68 (82) LV = 88/15 RA = 14 RV = 58/12 PA = 50/21  (35) PCW = 26 (no prominent v waves) Fick cardiac output/index = 3.7/1.8 PVR = 2.4 SVR = 1458 FA sat = 96% PA sat = 48%, 49%  - CPX 06/21/20 FVC 3.39 (82%)      FEV1 2.75 (85%)       Resting HR: 89 Peak HR: 162   (100% age predicted max HR)  BP rest: 116/20 BP peak: 196/90  Peak VO2: 16.5 (51% predicted peak VO2; corrected to ibw = 19.4 ml/kg/min) )  VE/VCO2 slope:  37  OUES: 1.56  Peak RER: 1.19  Ventilatory Threshold: 11.6 (36% predicted or measured peak VO2)  VE/MVV:  53%  PETCO2 at peak:  30  O2pulse:  11   (73% predicted O2pulse)   - Echo (2/22): EF 45-50%  ROS: All systems negative except as listed in HPI, PMH and Problem List.  SH:  Social History   Socioeconomic History   Marital status: Married    Spouse name: Not on file   Number of children: Not on file   Years of education: Not on file   Highest education level: Not on file  Occupational History   Not on file  Tobacco Use   Smoking status: Some Days    Packs/day: 0.25    Years: 20.00    Additional pack years: 0.00    Total pack years: 5.00    Types: Cigarettes    Last attempt to quit: 10/12/2021  Years since quitting: 0.6   Smokeless tobacco: Never   Tobacco comments:    1 cig/ daily  Vaping Use   Vaping Use: Never used  Substance and Sexual Activity   Alcohol use: Not Currently    Alcohol/week: 2.0 standard drinks of alcohol    Types: 1 Cans of beer, 1 Shots of liquor per week    Comment: stopped - over a month   Drug use: Not Currently   Sexual activity: Not on file  Other Topics Concern   Not on file  Social History Narrative   Not on file   Social Determinants of Health   Financial Resource Strain: High Risk (04/06/2020)   Overall Financial Resource Strain (CARDIA)    Difficulty of Paying Living Expenses: Very hard  Food Insecurity: No Food Insecurity (01/20/2022)   Hunger Vital Sign    Worried About Running Out of Food in the Last Year: Never true    Ran Out of Food in the Last  Year: Never true  Transportation Needs: No Transportation Needs (01/20/2022)   PRAPARE - Administrator, Civil Service (Medical): No    Lack of Transportation (Non-Medical): No  Physical Activity: Not on file  Stress: Not on file  Social Connections: Not on file  Intimate Partner Violence: Not At Risk (01/20/2022)   Humiliation, Afraid, Rape, and Kick questionnaire    Fear of Current or Ex-Partner: No    Emotionally Abused: No    Physically Abused: No    Sexually Abused: No   FH:  Family History  Problem Relation Age of Onset   Cancer Father    Heart disease Father    Prostate cancer Brother    Cancer Brother    Cancer - Colon Neg Hx    Colon polyps Neg Hx    Past Medical History:  Diagnosis Date   Arthritis    CHF (congestive heart failure) (HCC)    Chronic back pain    Coronary artery disease    Headache    Heart valve problem    Hypertension    Pre-diabetes    Tuberculosis 1991   per pt   Current Outpatient Medications  Medication Sig Dispense Refill   acetaminophen (TYLENOL) 500 MG tablet Take 500 mg by mouth every 6 (six) hours as needed for moderate pain.     aspirin EC 81 MG tablet Take 81 mg by mouth daily. Swallow whole.     atorvastatin (LIPITOR) 80 MG tablet Take 1 tablet (80 mg total) by mouth at bedtime. 30 tablet 0   carvedilol (COREG) 6.25 MG tablet TAKE 1.5 TABLETS BY MOUTH TWICE DAILY WITH MEALS. 135 tablet 0   docusate sodium (COLACE) 100 MG capsule Take 1 capsule (100 mg total) by mouth daily as needed for up to 30 doses. 30 capsule 0   furosemide (LASIX) 40 MG tablet Take 60 mg by mouth daily.     neomycin-bacitracin-polymyxin (NEOSPORIN) OINT Apply 1 Application topically as needed for wound care.     Oxycodone HCl 10 MG TABS Take 10 mg by mouth 4 (four) times daily.     oxyCODONE-acetaminophen (PERCOCET) 5-325 MG tablet Take 1 tablet by mouth every 4 (four) hours as needed for up to 18 doses for severe pain. 18 tablet 0   potassium  chloride (KLOR-CON) 20 MEQ packet Take 20 mEq by mouth daily.     sacubitril-valsartan (ENTRESTO) 49-51 MG Take 1 tablet by mouth 2 (two) times daily. 60 tablet 11  spironolactone (ALDACTONE) 25 MG tablet TAKE ONE TABLET BY MOUTH ONCE DAILY. 90 tablet 3   tamsulosin (FLOMAX) 0.4 MG CAPS capsule Take 1 capsule (0.4 mg total) by mouth daily. 10 capsule 0   Tetrahydrozoline HCl (VISINE OP) Place 1 drop into both eyes daily as needed (redness).     No current facility-administered medications for this visit.   There were no vitals taken for this visit.  Wt Readings from Last 3 Encounters:  04/30/22 99.8 kg (220 lb)  01/30/22 101.5 kg (223 lb 12.8 oz)  01/19/22 99 kg (218 lb 4.1 oz)   PHYSICAL EXAM: General:  Well appearing. No resp difficulty HEENT: normal Neck: supple. no JVD. Carotids 2+ bilat; no bruits. No lymphadenopathy or thryomegaly appreciated. Cor: PMI nondisplaced. Regular rate & rhythm. No rubs, gallops or murmurs. Lungs: clear Abdomen: soft, nontender, nondistended. No hepatosplenomegaly. No bruits or masses. Good bowel sounds. Extremities: no cyanosis, clubbing, rash, edema Neuro: alert & orientedx3, cranial nerves grossly intact. moves all 4 extremities w/o difficulty. Affect pleasant   ASSESSMENT & PLAN: 1. Chronic systolic HF - due to NICM (suspect HTN). - diagnosed in [place] in 2018 - TEE 04/18/20: LVEF 30-35. RV mod HK. Severe biatrial enlargement. 4+ MR and 4+ TR due to annular dilation  - Cath 04/18/20: LM 20%, LAD 20% Ost LCX 60%. 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 Mild MR/TR - Improved NYHA II. Volume ok  - Continue lasix 60 daily - BP on low end. Decrease Entresto 97/103 mg bid -> 49/51 bid - Continue carvedilol 9.375 mg bid.   - Continue spiro 25 mg daily. - Continue Farxiga 10 mg daily. - Says he had recent labs and "everything ok" - Due for repeat echo  2. CAD - Non-obstructive by cath 3/22. - No s/s ischemia - Continue ASA/statin.  3. HTN - Much improved. - GDMT as above.    4. Tobacco/cocaine use - Says he is now quit because he is on chronic pain management program    Jacklynn Ganong, FNP  4:37 PM

## 2022-06-02 ENCOUNTER — Ambulatory Visit (HOSPITAL_BASED_OUTPATIENT_CLINIC_OR_DEPARTMENT_OTHER)
Admission: RE | Admit: 2022-06-02 | Discharge: 2022-06-02 | Disposition: A | Discharge status: Home / Self Care | Payer: Medicaid Other | Source: Ambulatory Visit | Attending: Family Medicine | Admitting: Family Medicine

## 2022-06-02 ENCOUNTER — Encounter (HOSPITAL_COMMUNITY): Payer: Self-pay

## 2022-06-02 ENCOUNTER — Telehealth (HOSPITAL_COMMUNITY): Payer: Self-pay | Admitting: *Deleted

## 2022-06-02 ENCOUNTER — Ambulatory Visit (HOSPITAL_COMMUNITY)
Admission: RE | Admit: 2022-06-02 | Discharge: 2022-06-02 | Disposition: A | Discharge status: Home / Self Care | Payer: Medicaid Other | Source: Ambulatory Visit | Attending: Family Medicine | Admitting: Family Medicine

## 2022-06-02 VITALS — BP 136/80 | HR 56 | Wt 224.0 lb

## 2022-06-02 DIAGNOSIS — I2729 Other secondary pulmonary hypertension: Secondary | ICD-10-CM | POA: Diagnosis not present

## 2022-06-02 DIAGNOSIS — I5082 Biventricular heart failure: Secondary | ICD-10-CM | POA: Diagnosis not present

## 2022-06-02 DIAGNOSIS — F17210 Nicotine dependence, cigarettes, uncomplicated: Secondary | ICD-10-CM | POA: Insufficient documentation

## 2022-06-02 DIAGNOSIS — E785 Hyperlipidemia, unspecified: Secondary | ICD-10-CM | POA: Insufficient documentation

## 2022-06-02 DIAGNOSIS — I5042 Chronic combined systolic (congestive) and diastolic (congestive) heart failure: Secondary | ICD-10-CM | POA: Diagnosis not present

## 2022-06-02 DIAGNOSIS — I2510 Atherosclerotic heart disease of native coronary artery without angina pectoris: Secondary | ICD-10-CM | POA: Diagnosis not present

## 2022-06-02 DIAGNOSIS — I5022 Chronic systolic (congestive) heart failure: Secondary | ICD-10-CM | POA: Diagnosis not present

## 2022-06-02 DIAGNOSIS — F17200 Nicotine dependence, unspecified, uncomplicated: Secondary | ICD-10-CM

## 2022-06-02 DIAGNOSIS — Z79899 Other long term (current) drug therapy: Secondary | ICD-10-CM | POA: Diagnosis not present

## 2022-06-02 DIAGNOSIS — E669 Obesity, unspecified: Secondary | ICD-10-CM | POA: Diagnosis not present

## 2022-06-02 DIAGNOSIS — I428 Other cardiomyopathies: Secondary | ICD-10-CM | POA: Insufficient documentation

## 2022-06-02 DIAGNOSIS — F1910 Other psychoactive substance abuse, uncomplicated: Secondary | ICD-10-CM

## 2022-06-02 DIAGNOSIS — I110 Hypertensive heart disease with heart failure: Secondary | ICD-10-CM | POA: Insufficient documentation

## 2022-06-02 DIAGNOSIS — I10 Essential (primary) hypertension: Secondary | ICD-10-CM | POA: Diagnosis not present

## 2022-06-02 LAB — ECHOCARDIOGRAM COMPLETE
AR max vel: 2.6 cm2
AV Area VTI: 2.68 cm2
AV Area mean vel: 2.45 cm2
AV Mean grad: 5 mmHg
AV Peak grad: 10.8 mmHg
Ao pk vel: 1.64 m/s
Area-P 1/2: 3.19 cm2
Calc EF: 44 %
MV M vel: 6.13 m/s
MV Peak grad: 150.3 mmHg
Radius: 0.8 cm
S' Lateral: 3.4 cm
Single Plane A2C EF: 32.4 %
Single Plane A4C EF: 50.8 %

## 2022-06-02 MED ORDER — POTASSIUM CHLORIDE 20 MEQ PO PACK
20.0000 meq | PACK | Freq: Every day | ORAL | 11 refills | Status: DC
Start: 2022-06-02 — End: 2023-07-03

## 2022-06-02 MED ORDER — ENTRESTO 49-51 MG PO TABS
1.0000 | ORAL_TABLET | Freq: Two times a day (BID) | ORAL | 11 refills | Status: DC
Start: 2022-06-02 — End: 2023-03-31

## 2022-06-02 MED ORDER — FUROSEMIDE 40 MG PO TABS
40.0000 mg | ORAL_TABLET | Freq: Every day | ORAL | 11 refills | Status: DC
Start: 2022-06-02 — End: 2023-03-31

## 2022-06-02 MED ORDER — SPIRONOLACTONE 25 MG PO TABS
25.0000 mg | ORAL_TABLET | Freq: Every day | ORAL | 3 refills | Status: AC
Start: 2022-06-02 — End: ?

## 2022-06-02 MED ORDER — CARVEDILOL 6.25 MG PO TABS
ORAL_TABLET | ORAL | 11 refills | Status: DC
Start: 2022-06-02 — End: 2023-03-31

## 2022-06-02 MED ORDER — DAPAGLIFLOZIN PROPANEDIOL 10 MG PO TABS
10.0000 mg | ORAL_TABLET | Freq: Every day | ORAL | 11 refills | Status: DC
Start: 2022-06-02 — End: 2023-03-31

## 2022-06-02 NOTE — Patient Instructions (Addendum)
   Advanced Cardiac Care Program Guidelines for over-the-counter treatments for mild illnesses  Medications You can Use  Brand Name Generic Name Uses Comments  Benadryl Diphenhydramine Cold/allergy symptoms Do not use any Bendaryl products that say "congestion"  Loratadine Claritin Cold/allergy symptoms --  Coricidin HBP Varies with formulation Cold/flu  symptoms --  Mussinex Guaifenesin Mucus removal  --  Delsym Dextromethorphan Cough suppression --  Robitussin Dextromethorphan and guaifenesin Cough suppression + mucus removal Do not use robitussin products that include "PE", "PM," or "CF"  Vicks VapoRub Camphor, eucalyptus oil, menthol Cough and congestion  --  Vicks 44 Varies with formulation Cough suppression Do not use Vicks 44 D  Saline Nasal Spray Saline Congestion relief --   Medications to AVOID  Brand Name Generic Name  Alka-Seltzer Calcium carbonate + aspirin  Sudafed Pseudoephedrine  Comtrex Acetaminophen + pseudoephedrine  Theraflu Acetaminophen + phenylephrine  Dimetapp Brompheniramine + dextromethorphan + phenylephrine  Medicated nasal sprays Oxymetazoline, phenylephrine, etc.  Drixoral Dexbrompheniramine + pseudoephedrine  Chlor-Trimeton Chlorpheniramine  *AVOID AS THESE MEDICATIONS CAN CAUSE HIGH BLOOD PRESSURE AND HEART RATE  Common ingredients to avoid: pseudoephedrine, epinephrine, phenylephrine, brompheniramine, chlorpheniramine (ALWAYS READ LABLES CAREFULLY)  THIS IS NOT A COMPREHENSIVE LIST - If unsure about a medicine, PLEASE CALL AND ASK!   Restart Farxiga 10 mg daily. Restart Entresto 49/51 mg twice daily. Refills sent to local pharmacy for your heart failure meds. Labs drawn today - will call you if abnormal. Return to see Dr. Gala Romney in 4 months. Please call us at [phone] in July to schedule this appointment. If you have any questions or issues before next visit, please call us at [phone].

## 2022-06-02 NOTE — Progress Notes (Signed)
ReDS Vest / Clip - 06/02/22 1500       ReDS Vest / Clip   Station Marker D    Ruler Value 32    ReDS Value Range High volume overload    ReDS Actual Value 42

## 2022-06-02 NOTE — Telephone Encounter (Signed)
Patient left clinic today without getting labs (BMP, BNP). He reports he "had another appointment and didn't have time to have drawn".  Called patient and scheduled labs for next Tuesday at 3:45 as he requested. Scheduled and ordered. Pt verbalized understanding of same.

## 2022-06-10 ENCOUNTER — Ambulatory Visit (HOSPITAL_COMMUNITY)
Admission: RE | Admit: 2022-06-10 | Discharge: 2022-06-10 | Disposition: A | Discharge status: Home / Self Care | Payer: Medicaid Other | Source: Ambulatory Visit | Attending: Cardiology | Admitting: Cardiology

## 2022-06-10 DIAGNOSIS — I5022 Chronic systolic (congestive) heart failure: Secondary | ICD-10-CM | POA: Diagnosis present

## 2022-06-10 LAB — BASIC METABOLIC PANEL
Anion gap: 10 (ref 5–15)
BUN: 13 mg/dL (ref 6–20)
CO2: 23 mmol/L (ref 22–32)
Calcium: 9.7 mg/dL (ref 8.9–10.3)
Chloride: 109 mmol/L (ref 98–111)
Creatinine, Ser: 1.16 mg/dL (ref 0.61–1.24)
GFR, Estimated: >60 mL/min (ref >60)
Glucose, Bld: 94 mg/dL (ref 70–99)
Potassium: 4 mmol/L (ref 3.5–5.1)
Sodium: 142 mmol/L (ref 135–145)

## 2022-06-10 LAB — BRAIN NATRIURETIC PEPTIDE: B Natriuretic Peptide: 40.3 pg/mL (ref 0.0–100.0)

## 2023-03-31 ENCOUNTER — Other Ambulatory Visit (HOSPITAL_COMMUNITY): Payer: Self-pay | Admitting: Family Medicine

## 2023-03-31 DIAGNOSIS — I10 Essential (primary) hypertension: Secondary | ICD-10-CM

## 2023-03-31 DIAGNOSIS — I5022 Chronic systolic (congestive) heart failure: Secondary | ICD-10-CM

## 2023-05-08 ENCOUNTER — Telehealth (HOSPITAL_COMMUNITY): Payer: Self-pay | Admitting: Internal Medicine

## 2023-05-08 NOTE — Telephone Encounter (Signed)
 Called to confirm/remind patient of their appointment at the [facility] on 05/08/2023.   Appointment:   [] Confirmed  [] Left mess   [x] No answer/No voice mail  [] Phone not in service  Patient reminded to bring all medications and/or complete list.  Confirmed patient has transportation. Gave directions, instructed to utilize valet parking.

## 2023-05-11 ENCOUNTER — Inpatient Hospital Stay (HOSPITAL_COMMUNITY): Admission: RE | Admit: 2023-05-11 | Payer: Medicaid Other | Source: Ambulatory Visit | Admitting: Internal Medicine

## 2023-05-13 ENCOUNTER — Other Ambulatory Visit: Payer: Self-pay

## 2023-05-13 ENCOUNTER — Encounter (HOSPITAL_COMMUNITY): Payer: Self-pay

## 2023-05-13 ENCOUNTER — Emergency Department (HOSPITAL_COMMUNITY)
Admission: EM | Admit: 2023-05-13 | Discharge: 2023-05-13 | Disposition: A | Discharge status: Home / Self Care | Attending: Emergency Medicine | Admitting: Emergency Medicine

## 2023-05-13 DIAGNOSIS — Z7982 Long term (current) use of aspirin: Secondary | ICD-10-CM | POA: Insufficient documentation

## 2023-05-13 DIAGNOSIS — T7840XA Allergy, unspecified, initial encounter: Secondary | ICD-10-CM | POA: Diagnosis not present

## 2023-05-13 DIAGNOSIS — R220 Localized swelling, mass and lump, head: Secondary | ICD-10-CM | POA: Diagnosis present

## 2023-05-13 LAB — CBC WITH DIFFERENTIAL/PLATELET
Abs Immature Granulocytes: 0.02 10*3/uL (ref 0.00–0.07)
Basophils Absolute: 0 10*3/uL (ref 0.0–0.1)
Basophils Relative: 0 %
Eosinophils Absolute: 0.1 10*3/uL (ref 0.0–0.5)
Eosinophils Relative: 1 %
HCT: 36.3 % — ABNORMAL LOW (ref 39.0–52.0)
Hemoglobin: 12.7 g/dL — ABNORMAL LOW (ref 13.0–17.0)
Immature Granulocytes: 0 %
Lymphocytes Relative: 20 %
Lymphs Abs: 1.9 10*3/uL (ref 0.7–4.0)
MCH: 27.3 pg (ref 26.0–34.0)
MCHC: 35 g/dL (ref 30.0–36.0)
MCV: 78.1 fL — ABNORMAL LOW (ref 80.0–100.0)
Monocytes Absolute: 0.9 10*3/uL (ref 0.1–1.0)
Monocytes Relative: 10 %
Neutro Abs: 6.6 10*3/uL (ref 1.7–7.7)
Neutrophils Relative %: 69 %
Platelets: 172 10*3/uL (ref 150–400)
RBC: 4.65 MIL/uL (ref 4.22–5.81)
RDW: 14.1 % (ref 11.5–15.5)
Smear Review: ADEQUATE
WBC: 9.6 10*3/uL (ref 4.0–10.5)
nRBC: 0 % (ref 0.0–0.2)

## 2023-05-13 LAB — BASIC METABOLIC PANEL WITH GFR
Anion gap: 6 (ref 5–15)
BUN: 14 mg/dL (ref 8–23)
CO2: 25 mmol/L (ref 22–32)
Calcium: 9 mg/dL (ref 8.9–10.3)
Chloride: 109 mmol/L (ref 98–111)
Creatinine, Ser: 0.82 mg/dL (ref 0.61–1.24)
GFR, Estimated: >60 mL/min (ref >60)
Glucose, Bld: 101 mg/dL — ABNORMAL HIGH (ref 70–99)
Potassium: 3.6 mmol/L (ref 3.5–5.1)
Sodium: 140 mmol/L (ref 135–145)

## 2023-05-13 MED ORDER — PREDNISONE 20 MG PO TABS: ORAL_TABLET | ORAL | 0 refills | Status: DC

## 2023-05-13 MED ORDER — LORATADINE 10 MG PO TABS
10.0000 mg | ORAL_TABLET | Freq: Once | ORAL | Status: AC
  Administered 2023-05-13: 10 mg via ORAL
  Filled 2023-05-13: qty 1

## 2023-05-13 MED ORDER — PREDNISONE 50 MG PO TABS
60.0000 mg | ORAL_TABLET | Freq: Once | ORAL | Status: AC
  Administered 2023-05-13: 60 mg via ORAL
  Filled 2023-05-13: qty 1

## 2023-05-13 MED ORDER — CETIRIZINE HCL 10 MG PO TABS: 10.0000 mg | ORAL_TABLET | Freq: Every day | ORAL | 0 refills | Status: DC

## 2023-05-13 MED ORDER — CETIRIZINE HCL 10 MG PO TABS: 10.0000 mg | ORAL_TABLET | Freq: Every day | ORAL | 0 refills | Status: AC

## 2023-05-13 NOTE — Discharge Instructions (Signed)
Follow-up with your family doctor next week if not improving °

## 2023-05-13 NOTE — ED Triage Notes (Signed)
 Pt c/o rt eye swelling starting yesterday. Pt states no injury to eye just woke up yesterday with small amount of swelling and increased swelling this morning. No drainage noted and per pt no drainage. No hx of asthma. Pt on BP medication but unsure of what he takes.

## 2023-05-17 NOTE — ED Provider Notes (Signed)
 [place] EMERGENCY DEPARTMENT AT [facility] Provider Note   CSN: [id number] Arrival date & time: 05/13/23  0701     History  Chief Complaint  Patient presents with   Facial Swelling    Devon Silva is a 63 y.o. male.  Patient complains of swelling around his eyes and itching.  Patient has a history of hyperlipidemia  The history is provided by the patient and medical records. No language interpreter was used.  Allergic Reaction Presenting symptoms: swelling   Presenting symptoms: no rash   Severity:  Moderate Prior allergic episodes:  No prior episodes Context: not animal exposure   Relieved by:  Nothing Worsened by:  Nothing Ineffective treatments:  None tried      Home Medications Prior to Admission medications   Medication Sig Start Date End Date Taking? Authorizing Provider  acetaminophen (TYLENOL) 500 MG tablet Take 500 mg by mouth every 6 (six) hours as needed for moderate pain.    [provider]  aspirin EC 81 MG tablet Take 81 mg by mouth daily. Swallow whole.    [provider]  atorvastatin (LIPITOR) 80 MG tablet TAKE 1 TABLET BY MOUTH EVERY DAY *NEW PRESCRIPTION REQUEST* 03/31/23   Milford, Anderson Malta, FNP  carvedilol (COREG) 6.25 MG tablet TAKE ONE (1) TABLET BY MOUTH TWICE DAILY *NEW PRESCRIPTION REQUEST* 03/31/23   Milford, Anderson Malta, FNP  cetirizine (ZYRTEC ALLERGY) 10 MG tablet Take 1 tablet (10 mg total) by mouth daily. 05/13/23   Bethann Berkshire, MD  dapagliflozin propanediol (FARXIGA) 10 MG TABS tablet TAKE 1 TABLET BY MOUTH EVERY DAY *NEW PRESCRIPTION REQUEST* 03/31/23   Milford, Anderson Malta, FNP  docusate sodium (COLACE) 100 MG capsule Take 1 capsule (100 mg total) by mouth daily as needed for up to 30 doses. 01/17/22   Jannifer Hick, MD  furosemide (LASIX) 40 MG tablet TAKE 1 TABLET BY MOUTH EVERY DAY *NEW PRESCRIPTION REQUEST* 03/31/23   Milford, Anderson Malta, FNP  neomycin-bacitracin-polymyxin (NEOSPORIN) OINT Apply 1  Application topically as needed for wound care.    [provider]  Oxycodone HCl 10 MG TABS Take 10 mg by mouth 4 (four) times daily. 04/04/21   [provider]  potassium chloride (KLOR-CON) 20 MEQ packet Take 20 mEq by mouth daily. 06/02/22   Milford, Anderson Malta, FNP  potassium chloride SA (KLOR-CON M20) 20 MEQ tablet TAKE 1 TABLET BY MOUTH EVERY DAY *NEW PRESCRIPTION REQUEST* 03/31/23   Jacklynn Ganong, FNP  predniSONE (DELTASONE) 20 MG tablet 2 tabs po daily x 3 days 05/13/23   Bethann Berkshire, MD  sacubitril-valsartan (ENTRESTO) 49-51 MG TAKE ONE (1) TABLET BY MOUTH TWICE DAILY *NEW PRESCRIPTION REQUEST* 03/31/23   Milford, Anderson Malta, FNP  spironolactone (ALDACTONE) 25 MG tablet Take 1 tablet (25 mg total) by mouth daily. 06/02/22   Jacklynn Ganong, FNP  tamsulosin (FLOMAX) 0.4 MG CAPS capsule Take 1 capsule (0.4 mg total) by mouth daily. 09/15/21   Rolan Bucco, MD  Tetrahydrozoline HCl (VISINE OP) Place 1 drop into both eyes daily as needed (redness).    [provider]      Allergies    Patient has no known allergies.    Review of Systems   Review of Systems  Constitutional:  Negative for appetite change and fatigue.  HENT:  Negative for congestion, ear discharge and sinus pressure.        Swelling around the eyes  Eyes:  Negative for discharge.  Respiratory:  Negative for cough.  Cardiovascular:  Negative for chest pain.  Gastrointestinal:  Negative for abdominal pain and diarrhea.  Genitourinary:  Negative for frequency and hematuria.  Musculoskeletal:  Negative for back pain.  Skin:  Negative for rash.  Neurological:  Negative for seizures and headaches.  Psychiatric/Behavioral:  Negative for hallucinations.     Physical Exam Updated Vital Signs BP (!) 156/89   Pulse 64   Temp 98.1 F (36.7 C) (Oral)   Resp 18   Ht 5\' 11"  (1.803 m)   Wt 103.4 kg   SpO2 95%   BMI 31.80 kg/m  Physical Exam  ED Results / Procedures / Treatments    Labs (all labs ordered are listed, but only abnormal results are displayed) Labs Reviewed  CBC WITH DIFFERENTIAL/PLATELET - Abnormal; Notable for the following components:      Result Value   Hemoglobin 12.7 (*)    HCT 36.3 (*)    MCV 78.1 (*)    All other components within normal limits  BASIC METABOLIC PANEL WITH GFR - Abnormal; Notable for the following components:   Glucose, Bld 101 (*)    All other components within normal limits    EKG None  Radiology No results found.  Procedures Procedures    Medications Ordered in ED Medications  predniSONE (DELTASONE) tablet 60 mg (60 mg Oral Given 05/13/23 0800)  loratadine (CLARITIN) tablet 10 mg (10 mg Oral Given 05/13/23 0800)    ED Course/ Medical Decision Making/ A&P                                 Medical Decision Making Amount and/or Complexity of Data Reviewed Labs: ordered.  Risk OTC drugs. Prescription drug management.   Allergic reaction.  Patient sent home with prednisone and Zyrtec.  Patient improved with treatment in the emergency department        Final Clinical Impression(s) / ED Diagnoses Final diagnoses:  Allergic reaction, initial encounter    Rx / DC Orders ED Discharge Orders          Ordered    predniSONE (DELTASONE) 20 MG tablet  Status:  Discontinued        05/13/23 1204    cetirizine (ZYRTEC ALLERGY) 10 MG tablet  Daily,   Status:  Discontinued        05/13/23 1204    cetirizine (ZYRTEC ALLERGY) 10 MG tablet  Daily        05/13/23 1205    predniSONE (DELTASONE) 20 MG tablet        05/13/23 1205              Bethann Berkshire, MD 05/17/23 1142

## 2023-07-03 ENCOUNTER — Ambulatory Visit (HOSPITAL_COMMUNITY)
Admission: RE | Admit: 2023-07-03 | Discharge: 2023-07-03 | Disposition: A | Discharge status: Home / Self Care | Source: Ambulatory Visit | Attending: Internal Medicine | Admitting: Internal Medicine

## 2023-07-03 ENCOUNTER — Encounter (HOSPITAL_COMMUNITY): Payer: Self-pay | Admitting: Internal Medicine

## 2023-07-03 VITALS — BP 138/88 | HR 67 | Wt 230.4 lb

## 2023-07-03 DIAGNOSIS — F17210 Nicotine dependence, cigarettes, uncomplicated: Secondary | ICD-10-CM | POA: Insufficient documentation

## 2023-07-03 DIAGNOSIS — I10 Essential (primary) hypertension: Secondary | ICD-10-CM

## 2023-07-03 DIAGNOSIS — I110 Hypertensive heart disease with heart failure: Secondary | ICD-10-CM | POA: Diagnosis present

## 2023-07-03 DIAGNOSIS — G8929 Other chronic pain: Secondary | ICD-10-CM | POA: Diagnosis not present

## 2023-07-03 DIAGNOSIS — F17200 Nicotine dependence, unspecified, uncomplicated: Secondary | ICD-10-CM | POA: Diagnosis not present

## 2023-07-03 DIAGNOSIS — E119 Type 2 diabetes mellitus without complications: Secondary | ICD-10-CM | POA: Diagnosis not present

## 2023-07-03 DIAGNOSIS — I2510 Atherosclerotic heart disease of native coronary artery without angina pectoris: Secondary | ICD-10-CM | POA: Insufficient documentation

## 2023-07-03 DIAGNOSIS — M549 Dorsalgia, unspecified: Secondary | ICD-10-CM | POA: Insufficient documentation

## 2023-07-03 DIAGNOSIS — I428 Other cardiomyopathies: Secondary | ICD-10-CM | POA: Diagnosis present

## 2023-07-03 DIAGNOSIS — F1491 Cocaine use, unspecified, in remission: Secondary | ICD-10-CM | POA: Diagnosis not present

## 2023-07-03 DIAGNOSIS — I5042 Chronic combined systolic (congestive) and diastolic (congestive) heart failure: Secondary | ICD-10-CM | POA: Diagnosis present

## 2023-07-03 DIAGNOSIS — I5022 Chronic systolic (congestive) heart failure: Secondary | ICD-10-CM | POA: Diagnosis not present

## 2023-07-03 MED ORDER — ENTRESTO 97-103 MG PO TABS: 1.0000 | ORAL_TABLET | Freq: Two times a day (BID) | ORAL | 3 refills | Status: AC

## 2023-07-03 NOTE — Addendum Note (Signed)
 Encounter addended by: Deitra Fava, RN on: 07/03/2023 2:58 PM  Actions taken: Clinical Note Signed

## 2023-07-03 NOTE — Progress Notes (Signed)
 Patient did not want to have labs drawn today at appointment as ordered by Dr. Julane Ny- patient reports he will come back in 2 weeks for this. Reports he also had labs drawn today at [facility] medical.   Scheduled patient to return for labs on 6/6 at 3pm- patient aware.

## 2023-07-03 NOTE — Addendum Note (Signed)
 Encounter addended by: Wyett Narine B, RN on: 07/03/2023 3:01 PM  Actions taken: Clinical Note Signed

## 2023-07-03 NOTE — Addendum Note (Signed)
 Encounter addended by: Deitra Fava, RN on: 07/03/2023 2:52 PM  Actions taken: Clinical Note Signed, Pharmacy for encounter modified, Order list changed, Diagnosis association updated, Charge Capture section accepted

## 2023-07-03 NOTE — Patient Instructions (Addendum)
 Medication Changes:  INCREASE ENTRESTO  TO 97/103MG  TWICE DAILY   Lab Work:  RETURN FOR LABS AS SCHEDULED IN 2 WEEKS  Follow-Up in: 12 MONTHS WITH AN ECHO PLEASE CALL OUR OFFICE AROUND MARCH 2026 TO GET SCHEDULED FOR YOUR APPOINTMENT. PHONE NUMBER IS [phone] OPTION 2   At the [facility], you and your health needs are our priority. We have a designated team specialized in the treatment of Heart Failure. This Care Team includes your primary Heart Failure Specialized Cardiologist (physician), Advanced Practice Providers (APPs- Physician Assistants and Nurse Practitioners), and Pharmacist who all work together to provide you with the care you need, when you need it.   You may see any of the following providers on your designated Care Team at your next follow up:  Dr. Jules Oar Dr. Peder Bourdon Dr. Alwin Baars Dr. Judyth Nunnery Nieves Bars, NP Ruddy Corral, [place] [facility] [place], [place] Dennise Fitz, NP [place] Lee, NP Luster Salters, PharmD   Please be sure to bring in all your medications bottles to every appointment.   Need to Contact Us :  If you have any questions or concerns before your next appointment please send us  a message through [place] or call our office at [phone].    TO LEAVE A MESSAGE FOR THE NURSE SELECT OPTION 2, PLEASE LEAVE A MESSAGE INCLUDING: YOUR NAME DATE OF BIRTH CALL BACK NUMBER REASON FOR CALL**this is important as we prioritize the call backs  YOU WILL RECEIVE A CALL BACK THE SAME DAY AS LONG AS YOU CALL BEFORE 4:00 PM

## 2023-07-03 NOTE — Progress Notes (Signed)
 [facility] Note    PCP: Wyvonna Heidelberg, MD Primary Cardiologist: Dr. Ardell Beauvais HF MD: Dr Julane Ny  HPI: Devon Silva is a 63 y.o. 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    Diagnosed with systolic HF in [place] in 2018 in setting of severe HTN,  LHC revealed: Moderate 40% stenosis in the mid left circumflex; mild diffuse disease in the left and right coronary system   Moved to [place] in 2019, received most of his care at [facility] free clinic. Admitted 1/22 with COVID PNA.   Admitted 2/22 with HF. Echo 45-50% with global hypokinesis and grade 2 diastolic dysfunction, severe RV failure and dilation with severe pulmonary hypertension felt to possibly be secondary to left sided heart disease or longstanding tobacco abuse. Chest CT no PE.   Followed up in HF [facility] clinic and scheduled for TEE and R/L cath on 04/18/20 (see below).   Cath showed non-obs CAD, severe NICM with low output and moderately elevated filling pressures. CPX showed moderate limitation due to HF and body habitus.  Echo (8/22): EF 40-45%. Mild MR/TR  Follow up 3/23, started back smoking and drinking occasionally. NYHA II-III, volume up, REDs 43%. Lasix  increased to 40 daily.  Seen in ED 4/23 with CP and SOB, out off all meds, UDS + cocaine. Meds restarted.  Follow up 12/23, NYHA II. BP low and Entresto  decreased to 49/51.   Today he returns for HF follow up. At last visit in 4/24 he was off most of his meds and these were restarted.  Says he is doing good. Taking all his meds. No CP, SOB  or edema. Says he is smoking 1 cigarette per day when his wife drives him crazy.   Echo 06/02/22, EF 50-55%  Cardiac Studies: - Echo (8/22): EF 40-45%, mild MR/TR  - TEE (3/22): LVEF 30-35% RV mod HK. Severe biatrial enlargement. 4+ MR and 4+ TR due to annular dilation   - Cath (3/22): LM 20%, LAD 20% Ost LCX  60%. RCA 40%   RA = 14 RV = 58/12 PA = 50/21 (35) PCW = 26 (no prominent v waves) Fick cardiac output/index = 3.7/1.8 PVR = 2.4 SVR = 1458 FA sat = 96% PA sat = 48%, 49%  - CPX 06/21/20 FVC 3.39 (82%)      FEV1 2.75 (85%)       Resting HR: 89 Peak HR: 162   (100% age predicted max HR)  BP rest: 116/20 BP peak: 196/90  Peak VO2: 16.5 (51% predicted peak VO2; corrected to ibw = 19.4 ml/kg/min) )  VE/VCO2 slope:  37  OUES: 1.56  Peak RER: 1.19  Ventilatory Threshold: 11.6 (36% predicted or measured peak VO2)  VE/MVV:  53%  PETCO2 at peak:  30  O2pulse:  11   (73% predicted O2pulse)   ROS: All systems negative except as listed in HPI, PMH and Problem List.  SH:  Social History   Socioeconomic History   Marital status: Married    Spouse name: Not on file   Number of children: Not on file   Years of education: Not on file   Highest education level: Not on file  Occupational History   Not on file  Tobacco Use   Smoking status: Some Days    Current packs/day: 0.00    Average packs/day: 0.3 packs/day for 20.0 years (5.0 ttl pk-yrs)    Types: Cigarettes  Start date: 10/12/2001    Last attempt to quit: 10/12/2021    Years since quitting: 1.7   Smokeless tobacco: Never   Tobacco comments:    1 cig/ daily  Vaping Use   Vaping status: Never Used  Substance and Sexual Activity   Alcohol use: Not Currently    Alcohol/week: 2.0 standard drinks of alcohol    Types: 1 Cans of beer, 1 Shots of liquor per week    Comment: stopped - over a month   Drug use: Not Currently   Sexual activity: Not on file  Other Topics Concern   Not on file  Social History Narrative   Not on file   Social Drivers of Health   Financial Resource Strain: High Risk (04/06/2020)   Overall Financial Resource Strain (CARDIA)    Difficulty of Paying Living Expenses: Very hard  Food Insecurity: No Food Insecurity (01/20/2022)   Hunger Vital Sign    Worried About Running Out of Food in the Last Year:  Never true    Ran Out of Food in the Last Year: Never true  Transportation Needs: No Transportation Needs (01/20/2022)   PRAPARE - Administrator, Civil Service (Medical): No    Lack of Transportation (Non-Medical): No  Physical Activity: Not on file  Stress: Not on file  Social Connections: Unknown (06/24/2021)   Received from [facility]   Social Network    Social Network: Not on file  Intimate Partner Violence: Not At Risk (01/20/2022)   Humiliation, Afraid, Rape, and Kick questionnaire    Fear of Current or Ex-Partner: No    Emotionally Abused: No    Physically Abused: No    Sexually Abused: No   FH:  Family History  Problem Relation Age of Onset   Cancer Father    Heart disease Father    Prostate cancer Brother    Cancer Brother    Cancer - Colon Neg Hx    Colon polyps Neg Hx    Past Medical History:  Diagnosis Date   Arthritis    CHF (congestive heart failure) (HCC)    Chronic back pain    Coronary artery disease    Headache    Heart valve problem    Hypertension    Pre-diabetes    Tuberculosis 1991   per pt   Current Outpatient Medications  Medication Sig Dispense Refill   acetaminophen  (TYLENOL ) 500 MG tablet Take 500 mg by mouth every 6 (six) hours as needed for moderate pain.     aspirin  EC 81 MG tablet Take 81 mg by mouth daily. Swallow whole.     atorvastatin  (LIPITOR ) 80 MG tablet TAKE 1 TABLET BY MOUTH EVERY DAY *NEW PRESCRIPTION REQUEST* 90 tablet 3   carvedilol  (COREG ) 6.25 MG tablet TAKE ONE (1) TABLET BY MOUTH TWICE DAILY *NEW PRESCRIPTION REQUEST* 180 tablet 3   cetirizine  (ZYRTEC  ALLERGY) 10 MG tablet Take 1 tablet (10 mg total) by mouth daily. 10 tablet 0   dapagliflozin  propanediol (FARXIGA ) 10 MG TABS tablet TAKE 1 TABLET BY MOUTH EVERY DAY *NEW PRESCRIPTION REQUEST* 90 tablet 3   docusate sodium  (COLACE) 100 MG capsule Take 1 capsule (100 mg total) by mouth daily as needed for up to 30 doses. 30 capsule 0    furosemide  (LASIX ) 40 MG tablet TAKE 1 TABLET BY MOUTH EVERY DAY *NEW PRESCRIPTION REQUEST* 90 tablet 3   neomycin-bacitracin-polymyxin (NEOSPORIN) OINT Apply 1 Application topically as needed for wound care.  Oxycodone  HCl 10 MG TABS Take 10 mg by mouth 4 (four) times daily.     potassium chloride  SA (KLOR-CON  M20) 20 MEQ tablet TAKE 1 TABLET BY MOUTH EVERY DAY *NEW PRESCRIPTION REQUEST* 90 tablet 3   sacubitril -valsartan  (ENTRESTO ) 49-51 MG TAKE ONE (1) TABLET BY MOUTH TWICE DAILY *NEW PRESCRIPTION REQUEST* 180 tablet 3   spironolactone  (ALDACTONE ) 25 MG tablet Take 1 tablet (25 mg total) by mouth daily. 90 tablet 3   tamsulosin  (FLOMAX ) 0.4 MG CAPS capsule Take 1 capsule (0.4 mg total) by mouth daily. 10 capsule 0   Tetrahydrozoline HCl (VISINE OP) Place 1 drop into both eyes daily as needed (redness).     No current facility-administered medications for this encounter.   BP 138/88   Pulse 67   Wt 104.5 kg (230 lb 6.4 oz)   SpO2 96%   BMI 32.13 kg/m   Wt Readings from Last 3 Encounters:  07/03/23 104.5 kg (230 lb 6.4 oz)  05/13/23 103.4 kg (228 lb)  06/02/22 101.6 kg (224 lb)   PHYSICAL EXAM: General:  Well appearing. No resp difficulty HEENT: normal Neck: supple. no JVD. Carotids 2+ bilat; no bruits. No lymphadenopathy or thryomegaly appreciated. Cor: PMI nondisplaced. Regular rate & rhythm. No rubs, gallops or murmurs. Lungs: clear but decreased Abdomen: obese soft, nontender, nondistended. No hepatosplenomegaly. No bruits or masses. Good bowel sounds. Extremities: no cyanosis, clubbing, rash, edema Neuro: alert & orientedx3, cranial nerves grossly intact. moves all 4 extremities w/o difficulty. Affect pleasant   ECG: Sinus 64 with early repol Personally reviewed   1. Chronic systolic HF with recovered EF - due to NICM (suspect HTN). Diagnosed in [place] in 2018 - TEE (04/18/20): LVEF 30-35. RV mod HK. Severe biatrial enlargement. 4+ MR and 4+ TR due to annular dilation  -  Cath (04/18/20): LM 20%, LAD 20% Ost LCX 60%. 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 Mild MR/TR - Echo 06/02/22 EF 50-55% - NYHA I-II - Increase Entresto  to 97/103 bid mg bid. - Continue Farxiga  10 mg daily. - Continue Lasix  40 mg daily + 20 KCL daily. - Continue carvedilol  9.375 mg bid.  - Continue spironolactone  25 mg daily. - Recent labs ok from 05/13/23 K 3.6 Scr 0.82 - Refill meds - RTC 1 year with echo  2. CAD - Non-obstructive by cath 3/22. - No s/s angina - Continue ASA/statin.  3. HTN - BP up a little today (says he hasn't taken meds yet). - Increase Entresto     4. Tobacco/cocaine use - No further cocaine use - Smokes 1 cig/day, discussed need for cessation   5. Chronic back pain - followed by pain clinic   Jules Oar, MD  2:42 PM

## 2023-07-17 ENCOUNTER — Other Ambulatory Visit (HOSPITAL_COMMUNITY)

## 2023-07-17 DIAGNOSIS — I10 Essential (primary) hypertension: Secondary | ICD-10-CM | POA: Diagnosis not present

## 2023-08-24 ENCOUNTER — Other Ambulatory Visit (HOSPITAL_COMMUNITY): Payer: Self-pay

## 2023-08-24 ENCOUNTER — Telehealth (HOSPITAL_COMMUNITY): Payer: Self-pay | Admitting: Pharmacy Technician

## 2023-08-24 NOTE — Telephone Encounter (Signed)
 Patient Advocate Encounter   Received notification from [facility] that prior authorization for Farxiga  is required.   PA submitted on CoverMyMeds Key [id number] Status is pending   Will continue to follow.

## 2023-08-24 NOTE — Telephone Encounter (Signed)
 Advanced Heart Failure Patient Advocate Encounter  Prior Authorization for Farxiga  has been approved.    PA# [id number] Effective dates: 08/24/23 through 08/23/24  Almarie JULIANNA Pa, CPhT

## 2024-02-11 ENCOUNTER — Other Ambulatory Visit: Payer: Self-pay

## 2024-02-11 ENCOUNTER — Encounter (HOSPITAL_COMMUNITY): Payer: Self-pay | Admitting: Emergency Medicine

## 2024-02-11 ENCOUNTER — Emergency Department (HOSPITAL_COMMUNITY)

## 2024-02-11 ENCOUNTER — Emergency Department (HOSPITAL_COMMUNITY)
Admission: EM | Admit: 2024-02-11 | Discharge: 2024-02-11 | Disposition: A | Discharge status: Home / Self Care | Attending: Emergency Medicine | Admitting: Emergency Medicine

## 2024-02-11 DIAGNOSIS — I2510 Atherosclerotic heart disease of native coronary artery without angina pectoris: Secondary | ICD-10-CM | POA: Diagnosis not present

## 2024-02-11 DIAGNOSIS — M542 Cervicalgia: Secondary | ICD-10-CM | POA: Diagnosis present

## 2024-02-11 DIAGNOSIS — Z7982 Long term (current) use of aspirin: Secondary | ICD-10-CM | POA: Insufficient documentation

## 2024-02-11 LAB — CBC WITH DIFFERENTIAL/PLATELET
Abs Immature Granulocytes: 0.06 K/uL (ref 0.00–0.07)
Basophils Absolute: 0.1 K/uL (ref 0.0–0.1)
Basophils Relative: 1 %
Eosinophils Absolute: 0.2 K/uL (ref 0.0–0.5)
Eosinophils Relative: 1 %
HCT: 46.2 % (ref 39.0–52.0)
Hemoglobin: 16.2 g/dL (ref 13.0–17.0)
Immature Granulocytes: 0 %
Lymphocytes Relative: 22 %
Lymphs Abs: 3 K/uL (ref 0.7–4.0)
MCH: 27 pg (ref 26.0–34.0)
MCHC: 35.1 g/dL (ref 30.0–36.0)
MCV: 77 fL — ABNORMAL LOW (ref 80.0–100.0)
Monocytes Absolute: 1.4 K/uL — ABNORMAL HIGH (ref 0.1–1.0)
Monocytes Relative: 10 %
Neutro Abs: 9.3 K/uL — ABNORMAL HIGH (ref 1.7–7.7)
Neutrophils Relative %: 66 %
Platelets: 201 K/uL (ref 150–400)
RBC: 6 MIL/uL — ABNORMAL HIGH (ref 4.22–5.81)
RDW: 14.6 % (ref 11.5–15.5)
WBC: 14.1 K/uL — ABNORMAL HIGH (ref 4.0–10.5)
nRBC: 0 % (ref 0.0–0.2)

## 2024-02-11 LAB — URINE DRUG SCREEN
Amphetamines: NEGATIVE
Barbiturates: NEGATIVE
Benzodiazepines: NEGATIVE
Cocaine: NEGATIVE
Fentanyl: NEGATIVE
Methadone Scn, Ur: NEGATIVE
Opiates: NEGATIVE
Tetrahydrocannabinol: NEGATIVE

## 2024-02-11 LAB — TROPONIN T, HIGH SENSITIVITY
Troponin T High Sensitivity: 16 ng/L (ref 0–19)
Troponin T High Sensitivity: 16 ng/L (ref 0–19)

## 2024-02-11 LAB — COMPREHENSIVE METABOLIC PANEL WITH GFR
ALT: 22 U/L (ref 0–44)
AST: 23 U/L (ref 15–41)
Albumin: 4.6 g/dL (ref 3.5–5.0)
Alkaline Phosphatase: 128 U/L — ABNORMAL HIGH (ref 38–126)
Anion gap: 10 (ref 5–15)
BUN: 18 mg/dL (ref 8–23)
CO2: 25 mmol/L (ref 22–32)
Calcium: 10 mg/dL (ref 8.9–10.3)
Chloride: 105 mmol/L (ref 98–111)
Creatinine, Ser: 1.23 mg/dL (ref 0.61–1.24)
GFR, Estimated: >60 mL/min
Glucose, Bld: 117 mg/dL — ABNORMAL HIGH (ref 70–99)
Potassium: 4.3 mmol/L (ref 3.5–5.1)
Sodium: 140 mmol/L (ref 135–145)
Total Bilirubin: 0.4 mg/dL (ref 0.0–1.2)
Total Protein: 7.9 g/dL (ref 6.5–8.1)

## 2024-02-11 LAB — ETHANOL: Alcohol, Ethyl (B): <15 mg/dL

## 2024-02-11 MED ORDER — OXYCODONE HCL 5 MG PO TABS
10.0000 mg | ORAL_TABLET | ORAL | Status: AC
  Administered 2024-02-11: 10 mg via ORAL
  Filled 2024-02-11: qty 2

## 2024-02-11 MED ORDER — METHYLPREDNISOLONE SODIUM SUCC 40 MG IJ SOLR
40.0000 mg | Freq: Once | INTRAMUSCULAR | Status: AC
  Administered 2024-02-11: 40 mg via INTRAVENOUS
  Filled 2024-02-11: qty 1

## 2024-02-11 MED ORDER — CYCLOBENZAPRINE HCL 10 MG PO TABS: 10.0000 mg | ORAL_TABLET | Freq: Two times a day (BID) | ORAL | 0 refills | Status: AC | PRN

## 2024-02-11 MED ORDER — LIDOCAINE 5 % EX PTCH: 1.0000 | MEDICATED_PATCH | CUTANEOUS | Status: DC

## 2024-02-11 MED ORDER — DIAZEPAM 5 MG/ML IJ SOLN
5.0000 mg | Freq: Once | INTRAMUSCULAR | Status: AC
  Administered 2024-02-11: 5 mg via INTRAVENOUS
  Filled 2024-02-11: qty 2

## 2024-02-11 NOTE — Discharge Instructions (Addendum)
 You were seen for your neck pain in the emergency department.   At home, please use over-the-counter Tylenol and lidocaine patches.  You may also use the cyclobenzaprine we have prescribed you as needed for pain.  Do not take the cyclobenzaprine before driving or operating heavy machinery because it can make you drowsy.  Follow-up with your primary doctor in 2-3 days regarding your visit.  Follow-up with the spine clinic as soon as possible regarding your symptoms.  Return immediately to the emergency department if you experience any of the following: Numbness or weakness of your arms or legs, bowel or bladder incontinence, numbness while wiping after pooping or urinating, or any other concerning symptoms.    Thank you for visiting our Emergency Department. It was a pleasure taking care of you today.

## 2024-02-11 NOTE — ED Triage Notes (Signed)
 Pt c/o of waking up with left neck pain that radiates into left shoulder. Denies any trauma or injury.

## 2024-02-11 NOTE — ED Notes (Signed)
 Pt more restful. Still having some pain with movement of left arm.

## 2024-02-11 NOTE — ED Notes (Addendum)
 Pt came in stating he woke up with pain to left side of neck radiating down left arm. Pt arrived restless, crying and hollering in pain. Any movement makes pain worse. Rom limited due to pain. Denies injury. Denies cp. Pain with palpation to left side neck and posterior  left shoulder. Pt unable to lie on back. A/o.

## 2024-02-11 NOTE — ED Notes (Signed)
 Patient vomited.
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
## 2024-03-09 ENCOUNTER — Other Ambulatory Visit (HOSPITAL_COMMUNITY): Payer: Self-pay | Admitting: Family Medicine

## 2024-03-09 DIAGNOSIS — I5022 Chronic systolic (congestive) heart failure: Secondary | ICD-10-CM

## 2024-03-09 DIAGNOSIS — I10 Essential (primary) hypertension: Secondary | ICD-10-CM
